# Patient Record
Sex: Male | Born: 1950 | Race: White | Hispanic: No | Marital: Married | Smoking: Never smoker
Health system: Southern US, Community
[De-identification: ages and names within clinical notes are randomized; demographics above are authoritative.]

## PROBLEM LIST (undated history)

## (undated) DIAGNOSIS — E7800 Pure hypercholesterolemia, unspecified: Secondary | ICD-10-CM

## (undated) DIAGNOSIS — I2510 Atherosclerotic heart disease of native coronary artery without angina pectoris: Secondary | ICD-10-CM

## (undated) DIAGNOSIS — I82409 Acute embolism and thrombosis of unspecified deep veins of unspecified lower extremity: Secondary | ICD-10-CM

## (undated) DIAGNOSIS — M1990 Unspecified osteoarthritis, unspecified site: Secondary | ICD-10-CM

## (undated) DIAGNOSIS — M1711 Unilateral primary osteoarthritis, right knee: Secondary | ICD-10-CM

## (undated) DIAGNOSIS — I10 Essential (primary) hypertension: Secondary | ICD-10-CM

## (undated) DIAGNOSIS — I219 Acute myocardial infarction, unspecified: Secondary | ICD-10-CM

## (undated) HISTORY — PX: CARDIAC CATHETERIZATION: SHX172

---

## 2015-05-04 DIAGNOSIS — M170 Bilateral primary osteoarthritis of knee: Secondary | ICD-10-CM | POA: Diagnosis not present

## 2015-05-24 DIAGNOSIS — M170 Bilateral primary osteoarthritis of knee: Secondary | ICD-10-CM | POA: Diagnosis not present

## 2015-05-31 DIAGNOSIS — M170 Bilateral primary osteoarthritis of knee: Secondary | ICD-10-CM | POA: Diagnosis not present

## 2015-06-08 DIAGNOSIS — M170 Bilateral primary osteoarthritis of knee: Secondary | ICD-10-CM | POA: Diagnosis not present

## 2016-05-08 DIAGNOSIS — I10 Essential (primary) hypertension: Secondary | ICD-10-CM | POA: Diagnosis not present

## 2016-05-08 DIAGNOSIS — G8929 Other chronic pain: Secondary | ICD-10-CM | POA: Diagnosis not present

## 2016-05-08 DIAGNOSIS — Z1159 Encounter for screening for other viral diseases: Secondary | ICD-10-CM | POA: Diagnosis not present

## 2016-05-08 DIAGNOSIS — M25562 Pain in left knee: Secondary | ICD-10-CM | POA: Diagnosis not present

## 2016-05-08 DIAGNOSIS — Z125 Encounter for screening for malignant neoplasm of prostate: Secondary | ICD-10-CM | POA: Diagnosis not present

## 2016-05-08 DIAGNOSIS — M25561 Pain in right knee: Secondary | ICD-10-CM | POA: Diagnosis not present

## 2016-05-08 DIAGNOSIS — Z8639 Personal history of other endocrine, nutritional and metabolic disease: Secondary | ICD-10-CM | POA: Diagnosis not present

## 2016-05-08 DIAGNOSIS — Z1211 Encounter for screening for malignant neoplasm of colon: Secondary | ICD-10-CM | POA: Diagnosis not present

## 2016-07-03 ENCOUNTER — Encounter (HOSPITAL_COMMUNITY)
Admission: EM | Disposition: A | Discharge status: Home / Self Care | Payer: Self-pay | Source: Home / Self Care | Attending: Cardiothoracic Surgery

## 2016-07-03 ENCOUNTER — Encounter (HOSPITAL_COMMUNITY): Payer: Self-pay | Admitting: Adult Health

## 2016-07-03 ENCOUNTER — Inpatient Hospital Stay (HOSPITAL_COMMUNITY): Payer: Medicare Other

## 2016-07-03 ENCOUNTER — Inpatient Hospital Stay (HOSPITAL_COMMUNITY)
Admission: EM | Admit: 2016-07-03 | Discharge: 2016-07-10 | DRG: 234 | Disposition: A | Discharge status: Home / Self Care | Payer: Medicare Other | Attending: Cardiothoracic Surgery | Admitting: Cardiothoracic Surgery

## 2016-07-03 DIAGNOSIS — Z01810 Encounter for preprocedural cardiovascular examination: Secondary | ICD-10-CM

## 2016-07-03 DIAGNOSIS — I25110 Atherosclerotic heart disease of native coronary artery with unstable angina pectoris: Secondary | ICD-10-CM | POA: Diagnosis present

## 2016-07-03 DIAGNOSIS — Z09 Encounter for follow-up examination after completed treatment for conditions other than malignant neoplasm: Secondary | ICD-10-CM

## 2016-07-03 DIAGNOSIS — Z8249 Family history of ischemic heart disease and other diseases of the circulatory system: Secondary | ICD-10-CM

## 2016-07-03 DIAGNOSIS — I214 Non-ST elevation (NSTEMI) myocardial infarction: Principal | ICD-10-CM | POA: Diagnosis present

## 2016-07-03 DIAGNOSIS — D6959 Other secondary thrombocytopenia: Secondary | ICD-10-CM | POA: Diagnosis not present

## 2016-07-03 DIAGNOSIS — I2510 Atherosclerotic heart disease of native coronary artery without angina pectoris: Secondary | ICD-10-CM | POA: Diagnosis not present

## 2016-07-03 DIAGNOSIS — I2129 ST elevation (STEMI) myocardial infarction involving other sites: Secondary | ICD-10-CM

## 2016-07-03 DIAGNOSIS — Z8349 Family history of other endocrine, nutritional and metabolic diseases: Secondary | ICD-10-CM

## 2016-07-03 DIAGNOSIS — Z6839 Body mass index (BMI) 39.0-39.9, adult: Secondary | ICD-10-CM

## 2016-07-03 DIAGNOSIS — M1990 Unspecified osteoarthritis, unspecified site: Secondary | ICD-10-CM | POA: Diagnosis present

## 2016-07-03 DIAGNOSIS — E7800 Pure hypercholesterolemia, unspecified: Secondary | ICD-10-CM | POA: Diagnosis not present

## 2016-07-03 DIAGNOSIS — I2584 Coronary atherosclerosis due to calcified coronary lesion: Secondary | ICD-10-CM | POA: Diagnosis present

## 2016-07-03 DIAGNOSIS — E785 Hyperlipidemia, unspecified: Secondary | ICD-10-CM | POA: Diagnosis present

## 2016-07-03 DIAGNOSIS — Z951 Presence of aortocoronary bypass graft: Secondary | ICD-10-CM

## 2016-07-03 DIAGNOSIS — D62 Acute posthemorrhagic anemia: Secondary | ICD-10-CM | POA: Diagnosis not present

## 2016-07-03 DIAGNOSIS — R079 Chest pain, unspecified: Secondary | ICD-10-CM

## 2016-07-03 DIAGNOSIS — J9811 Atelectasis: Secondary | ICD-10-CM

## 2016-07-03 DIAGNOSIS — R072 Precordial pain: Secondary | ICD-10-CM

## 2016-07-03 DIAGNOSIS — E8770 Fluid overload, unspecified: Secondary | ICD-10-CM | POA: Diagnosis not present

## 2016-07-03 DIAGNOSIS — I10 Essential (primary) hypertension: Secondary | ICD-10-CM | POA: Diagnosis not present

## 2016-07-03 DIAGNOSIS — I088 Other rheumatic multiple valve diseases: Secondary | ICD-10-CM | POA: Diagnosis not present

## 2016-07-03 HISTORY — DX: Pure hypercholesterolemia, unspecified: E78.00

## 2016-07-03 HISTORY — DX: Unspecified osteoarthritis, unspecified site: M19.90

## 2016-07-03 HISTORY — DX: Essential (primary) hypertension: I10

## 2016-07-03 HISTORY — PX: LEFT HEART CATH AND CORONARY ANGIOGRAPHY: CATH118249

## 2016-07-03 LAB — BASIC METABOLIC PANEL
Anion gap: 9 (ref 5–15)
BUN: 20 mg/dL (ref 6–20)
CHLORIDE: 103 mmol/L (ref 101–111)
CO2: 25 mmol/L (ref 22–32)
Calcium: 9 mg/dL (ref 8.9–10.3)
Creatinine, Ser: 0.98 mg/dL (ref 0.61–1.24)
GFR calc Af Amer: >60 mL/min (ref >60)
GFR calc non Af Amer: >60 mL/min (ref >60)
Glucose, Bld: 101 mg/dL — ABNORMAL HIGH (ref 65–99)
Potassium: 4.1 mmol/L (ref 3.5–5.1)
SODIUM: 137 mmol/L (ref 135–145)

## 2016-07-03 LAB — BLOOD GAS, ARTERIAL
Acid-Base Excess: 1.7 mmol/L (ref 0.0–2.0)
Bicarbonate: 25.2 mmol/L (ref 20.0–28.0)
Drawn by: 44135
FIO2: 21
O2 Saturation: 95.3 %
Patient temperature: 98.6
pCO2 arterial: 35.7 mmHg (ref 32.0–48.0)
pH, Arterial: 7.462 — ABNORMAL HIGH (ref 7.350–7.450)
pO2, Arterial: 78.8 mmHg — ABNORMAL LOW (ref 83.0–108.0)

## 2016-07-03 LAB — CBC
HEMATOCRIT: 41.2 % (ref 39.0–52.0)
HEMOGLOBIN: 13.6 g/dL (ref 13.0–17.0)
MCH: 28.6 pg (ref 26.0–34.0)
MCHC: 33 g/dL (ref 30.0–36.0)
MCV: 86.7 fL (ref 78.0–100.0)
Platelets: 206 10*3/uL (ref 150–400)
RBC: 4.75 MIL/uL (ref 4.22–5.81)
RDW: 13.9 % (ref 11.5–15.5)
WBC: 6.6 10*3/uL (ref 4.0–10.5)

## 2016-07-03 LAB — POCT I-STAT TROPONIN I: TROPONIN I, POC: 0.01 ng/mL (ref 0.00–0.08)

## 2016-07-03 LAB — ECHOCARDIOGRAM COMPLETE
EERAT: 6.78
FS: 31 % (ref 28–44)
Height: 70 in
IVS/LV PW RATIO, ED: 0.92
LA ID, A-P, ES: 39 mm
LA vol index: 26.3 mL/m2
LADIAMINDEX: 1.6 cm/m2
LAVOL: 64.2 mL
LAVOLA4C: 56.2 mL
LEFT ATRIUM END SYS DIAM: 39 mm
LV E/e' medial: 6.78
LV E/e'average: 6.78
LV PW d: 12 mm — AB (ref 0.6–1.1)
LVELAT: 13.3 cm/s
LVOT SV: 71 mL
LVOT VTI: 22.7 cm
LVOT area: 3.14 cm2
LVOT diameter: 20 mm
LVOT peak grad rest: 3 mmHg
LVOT peak vel: 93.1 cm/s
Lateral S' vel: 14 cm/s
MV Peak grad: 3 mmHg
MV pk A vel: 70.3 m/s
MVPKEVEL: 90.2 m/s
RV TAPSE: 27 mm
TDI e' lateral: 13.3
TDI e' medial: 9.14
WEIGHTICAEL: 4091.74 [oz_av]

## 2016-07-03 LAB — POCT I-STAT, CHEM 8
BUN: 22 mg/dL — AB (ref 6–20)
CALCIUM ION: 1.14 mmol/L — AB (ref 1.15–1.40)
CREATININE: 0.9 mg/dL (ref 0.61–1.24)
Chloride: 100 mmol/L — ABNORMAL LOW (ref 101–111)
GLUCOSE: 110 mg/dL — AB (ref 65–99)
HCT: 39 % (ref 39.0–52.0)
Hemoglobin: 13.3 g/dL (ref 13.0–17.0)
Potassium: 3.1 mmol/L — ABNORMAL LOW (ref 3.5–5.1)
Sodium: 138 mmol/L (ref 135–145)
TCO2: 26 mmol/L (ref 0–100)

## 2016-07-03 LAB — ABO/RH: ABO/RH(D): O POS

## 2016-07-03 LAB — TROPONIN I: Troponin I: 0.97 ng/mL (ref <0.03)

## 2016-07-03 LAB — SURGICAL PCR SCREEN
MRSA, PCR: NEGATIVE
Staphylococcus aureus: NEGATIVE

## 2016-07-03 LAB — PROTIME-INR
INR: 1.06
Prothrombin Time: 13.9 seconds (ref 11.4–15.2)

## 2016-07-03 SURGERY — LEFT HEART CATH AND CORONARY ANGIOGRAPHY

## 2016-07-03 MED ORDER — TRANEXAMIC ACID (OHS) BOLUS VIA INFUSION
15.0000 mg/kg | INTRAVENOUS | Status: DC
  Filled 2016-07-03: qty 1740

## 2016-07-03 MED ORDER — SODIUM CHLORIDE 0.9% FLUSH
3.0000 mL | Freq: Two times a day (BID) | INTRAVENOUS | Status: DC
  Administered 2016-07-04: 3 mL via INTRAVENOUS

## 2016-07-03 MED ORDER — SODIUM CHLORIDE 0.9 % IV SOLN: 250.0000 mL | INTRAVENOUS | Status: DC | PRN

## 2016-07-03 MED ORDER — SODIUM CHLORIDE 0.9 % IV SOLN
INTRAVENOUS | Status: DC
  Filled 2016-07-03: qty 30

## 2016-07-03 MED ORDER — SODIUM CHLORIDE 0.9 % IV SOLN
30.0000 ug/min | INTRAVENOUS | Status: DC
  Filled 2016-07-03: qty 2

## 2016-07-03 MED ORDER — SODIUM CHLORIDE 0.9 % IV SOLN
INTRAVENOUS | Status: DC | PRN
  Administered 2016-07-03: 100 mL/h via INTRAVENOUS

## 2016-07-03 MED ORDER — METOPROLOL SUCCINATE ER 25 MG PO TB24
12.5000 mg | ORAL_TABLET | Freq: Every day | ORAL | Status: DC
  Administered 2016-07-03: 12.5 mg via ORAL
  Filled 2016-07-03 (×2): qty 1

## 2016-07-03 MED ORDER — ACETAMINOPHEN 325 MG PO TABS: 650.0000 mg | ORAL_TABLET | ORAL | Status: DC | PRN

## 2016-07-03 MED ORDER — NITROGLYCERIN IN D5W 200-5 MCG/ML-% IV SOLN
INTRAVENOUS | Status: AC
  Filled 2016-07-03: qty 250

## 2016-07-03 MED ORDER — ONDANSETRON HCL 4 MG/2ML IJ SOLN: 4.0000 mg | Freq: Four times a day (QID) | INTRAMUSCULAR | Status: DC | PRN

## 2016-07-03 MED ORDER — CHLORHEXIDINE GLUCONATE 0.12 % MT SOLN
15.0000 mL | Freq: Once | OROMUCOSAL | Status: AC
  Administered 2016-07-04: 15 mL via OROMUCOSAL

## 2016-07-03 MED ORDER — POTASSIUM CHLORIDE 2 MEQ/ML IV SOLN
80.0000 meq | INTRAVENOUS | Status: DC
  Filled 2016-07-03: qty 40

## 2016-07-03 MED ORDER — NITROGLYCERIN 0.4 MG SL SUBL
0.4000 mg | SUBLINGUAL_TABLET | SUBLINGUAL | Status: DC | PRN
  Administered 2016-07-03: 0.4 mg via SUBLINGUAL
  Filled 2016-07-03: qty 1

## 2016-07-03 MED ORDER — TRANEXAMIC ACID (OHS) PUMP PRIME SOLUTION
2.0000 mg/kg | INTRAVENOUS | Status: DC
  Filled 2016-07-03: qty 2.32

## 2016-07-03 MED ORDER — CHLORHEXIDINE GLUCONATE CLOTH 2 % EX PADS
6.0000 | MEDICATED_PAD | Freq: Once | CUTANEOUS | Status: AC
  Administered 2016-07-03: 6 via TOPICAL

## 2016-07-03 MED ORDER — TRANEXAMIC ACID 1000 MG/10ML IV SOLN
1.5000 mg/kg/h | INTRAVENOUS | Status: DC
  Filled 2016-07-03: qty 25

## 2016-07-03 MED ORDER — LIDOCAINE HCL (PF) 1 % IJ SOLN
INTRAMUSCULAR | Status: AC
  Filled 2016-07-03: qty 30

## 2016-07-03 MED ORDER — BISACODYL 5 MG PO TBEC
5.0000 mg | DELAYED_RELEASE_TABLET | Freq: Once | ORAL | Status: AC
  Administered 2016-07-03: 5 mg via ORAL
  Filled 2016-07-03: qty 1

## 2016-07-03 MED ORDER — NITROGLYCERIN IN D5W 200-5 MCG/ML-% IV SOLN
0.0000 ug/min | INTRAVENOUS | Status: DC
Start: 2016-07-03 — End: 2016-07-04
  Administered 2016-07-03: 5 ug/min via INTRAVENOUS

## 2016-07-03 MED ORDER — ASPIRIN 81 MG PO CHEW
324.0000 mg | CHEWABLE_TABLET | Freq: Once | ORAL | Status: AC
  Administered 2016-07-03: 324 mg via ORAL
  Filled 2016-07-03: qty 4

## 2016-07-03 MED ORDER — MAGNESIUM SULFATE 50 % IJ SOLN
40.0000 meq | INTRAMUSCULAR | Status: DC
  Filled 2016-07-03: qty 10

## 2016-07-03 MED ORDER — SODIUM CHLORIDE 0.9% FLUSH: 3.0000 mL | INTRAVENOUS | Status: DC | PRN

## 2016-07-03 MED ORDER — HEPARIN (PORCINE) IN NACL 100-0.45 UNIT/ML-% IJ SOLN
1300.0000 [IU]/h | INTRAMUSCULAR | Status: DC
  Administered 2016-07-04: 1300 [IU]/h via INTRAVENOUS
  Filled 2016-07-03: qty 250

## 2016-07-03 MED ORDER — IOPAMIDOL (ISOVUE-370) INJECTION 76%
INTRAVENOUS | Status: AC
  Filled 2016-07-03: qty 125

## 2016-07-03 MED ORDER — NITROGLYCERIN IN D5W 200-5 MCG/ML-% IV SOLN
INTRAVENOUS | Status: DC | PRN
  Administered 2016-07-03: 10 ug/min via INTRAVENOUS

## 2016-07-03 MED ORDER — CHLORHEXIDINE GLUCONATE CLOTH 2 % EX PADS: 6.0000 | MEDICATED_PAD | Freq: Once | CUTANEOUS | Status: AC

## 2016-07-03 MED ORDER — TEMAZEPAM 7.5 MG PO CAPS
15.0000 mg | ORAL_CAPSULE | Freq: Once | ORAL | Status: DC | PRN
  Filled 2016-07-03: qty 2

## 2016-07-03 MED ORDER — PLASMA-LYTE 148 IV SOLN
INTRAVENOUS | Status: DC
  Filled 2016-07-03: qty 2.5

## 2016-07-03 MED ORDER — IOPAMIDOL (ISOVUE-370) INJECTION 76%
INTRAVENOUS | Status: DC | PRN
  Administered 2016-07-03: 110 mL via INTRA_ARTERIAL

## 2016-07-03 MED ORDER — SODIUM CHLORIDE 0.9 % IV SOLN
INTRAVENOUS | Status: DC
  Filled 2016-07-03: qty 2.5

## 2016-07-03 MED ORDER — NITROGLYCERIN IN D5W 200-5 MCG/ML-% IV SOLN
2.0000 ug/min | INTRAVENOUS | Status: DC
  Filled 2016-07-03: qty 250

## 2016-07-03 MED ORDER — MIDAZOLAM HCL 2 MG/2ML IJ SOLN
INTRAMUSCULAR | Status: DC | PRN
Start: 2016-07-03 — End: 2016-07-03
  Administered 2016-07-03: 1 mg via INTRAVENOUS

## 2016-07-03 MED ORDER — DOPAMINE-DEXTROSE 3.2-5 MG/ML-% IV SOLN
0.0000 ug/kg/min | INTRAVENOUS | Status: DC
  Filled 2016-07-03: qty 250

## 2016-07-03 MED ORDER — DEXTROSE 5 % IV SOLN
750.0000 mg | INTRAVENOUS | Status: DC
  Filled 2016-07-03: qty 750

## 2016-07-03 MED ORDER — VERAPAMIL HCL 2.5 MG/ML IV SOLN
INTRAVENOUS | Status: DC | PRN
  Administered 2016-07-03: 10 mL via INTRA_ARTERIAL

## 2016-07-03 MED ORDER — MIDAZOLAM HCL 2 MG/2ML IJ SOLN
INTRAMUSCULAR | Status: AC
Start: 2016-07-03 — End: 2016-07-03
  Filled 2016-07-03: qty 2

## 2016-07-03 MED ORDER — VERAPAMIL HCL 2.5 MG/ML IV SOLN
INTRAVENOUS | Status: AC
  Filled 2016-07-03: qty 2

## 2016-07-03 MED ORDER — DEXMEDETOMIDINE HCL IN NACL 400 MCG/100ML IV SOLN
0.1000 ug/kg/h | INTRAVENOUS | Status: DC
  Filled 2016-07-03: qty 100

## 2016-07-03 MED ORDER — VANCOMYCIN HCL 10 G IV SOLR
1500.0000 mg | INTRAVENOUS | Status: DC
  Filled 2016-07-03: qty 1500

## 2016-07-03 MED ORDER — LIDOCAINE HCL (PF) 1 % IJ SOLN
INTRAMUSCULAR | Status: DC | PRN
  Administered 2016-07-03: 20 mL
  Administered 2016-07-03: 2 mL

## 2016-07-03 MED ORDER — SODIUM CHLORIDE 0.9 % WEIGHT BASED INFUSION
1.0000 mL/kg/h | INTRAVENOUS | Status: DC
  Administered 2016-07-03: 250 mL via INTRAVENOUS

## 2016-07-03 MED ORDER — ASPIRIN 81 MG PO CHEW: 81.0000 mg | CHEWABLE_TABLET | Freq: Every day | ORAL | Status: DC

## 2016-07-03 MED ORDER — CEFUROXIME SODIUM 1.5 G IJ SOLR
1.5000 g | INTRAMUSCULAR | Status: DC
  Filled 2016-07-03: qty 1.5

## 2016-07-03 MED ORDER — EPINEPHRINE PF 1 MG/ML IJ SOLN
0.0000 ug/min | INTRAVENOUS | Status: DC
  Filled 2016-07-03: qty 4

## 2016-07-03 MED ORDER — HEPARIN (PORCINE) IN NACL 2-0.9 UNIT/ML-% IJ SOLN
INTRAMUSCULAR | Status: DC | PRN
  Administered 2016-07-03: 1000 mL

## 2016-07-03 MED ORDER — NITROGLYCERIN 0.4 MG SL SUBL: 0.4000 mg | SUBLINGUAL_TABLET | SUBLINGUAL | Status: DC | PRN

## 2016-07-03 MED ORDER — FENTANYL CITRATE (PF) 100 MCG/2ML IJ SOLN
INTRAMUSCULAR | Status: AC
  Filled 2016-07-03: qty 2

## 2016-07-03 MED ORDER — HEPARIN (PORCINE) IN NACL 2-0.9 UNIT/ML-% IJ SOLN
INTRAMUSCULAR | Status: AC
  Filled 2016-07-03: qty 1000

## 2016-07-03 MED ORDER — FENTANYL CITRATE (PF) 100 MCG/2ML IJ SOLN
INTRAMUSCULAR | Status: DC | PRN
  Administered 2016-07-03: 25 ug via INTRAVENOUS

## 2016-07-03 MED ORDER — METOPROLOL TARTRATE 12.5 MG HALF TABLET
12.5000 mg | ORAL_TABLET | Freq: Once | ORAL | Status: AC
  Administered 2016-07-04: 12.5 mg via ORAL
  Filled 2016-07-03: qty 1

## 2016-07-03 SURGICAL SUPPLY — 16 items
CATH EXPO 5FR ANG PIGTAIL 145 (CATHETERS) ×2 IMPLANT
CATH EXPO 5FR FR4 (CATHETERS) ×2 IMPLANT
DEVICE RAD COMP TR BAND LRG (VASCULAR PRODUCTS) ×2 IMPLANT
GLIDESHEATH SLEND SS 6F .021 (SHEATH) ×2 IMPLANT
GUIDE CATH RUNWAY 6FR CLS3.5 (CATHETERS) ×2 IMPLANT
GUIDEWIRE INQWIRE 1.5J.035X260 (WIRE) ×1 IMPLANT
INQWIRE 1.5J .035X260CM (WIRE) ×2
KIT ENCORE 26 ADVANTAGE (KITS) ×2 IMPLANT
KIT HEART LEFT (KITS) ×2 IMPLANT
PACK CARDIAC CATHETERIZATION (CUSTOM PROCEDURE TRAY) ×2 IMPLANT
SHEATH PINNACLE 6F 10CM (SHEATH) ×2 IMPLANT
SYR MEDRAD MARK V 150ML (SYRINGE) ×2 IMPLANT
TRANSDUCER W/STOPCOCK (MISCELLANEOUS) ×2 IMPLANT
TUBING CIL FLEX 10 FLL-RA (TUBING) ×2 IMPLANT
WIRE EMERALD 3MM-J .035X150CM (WIRE) ×2 IMPLANT
WIRE HI TORQ VERSACORE-J 145CM (WIRE) ×2 IMPLANT

## 2016-07-03 NOTE — ED Notes (Signed)
Cardiology at bedside.

## 2016-07-03 NOTE — ED Notes (Signed)
MD at bedside. 

## 2016-07-03 NOTE — ED Provider Notes (Signed)
[facility]-EMERGENCY DEPT Provider Note   CSN: [id number] Arrival date & time: 07/03/16  1536     History   Chief Complaint Chief Complaint  Patient presents with  . Chest Pain    HPI Joseph COZZOLINO is a 66 y.o. male.  66 yo M with a chief complaint of chest pain. Patient states this started about an hour or so ago. Was sitting on a stool when it occurred. Nothing seems to make it worse. Patient said it improved when he took some aspirin. Mild shortness of breath with it. Denies radiation denies diaphoresis or vomiting. Denies lower extremity edema denies history of PE or DVT. As his travel or recent hospitalization. Has a history of hypertension denies hyperlipidemia diabetes family history or smoking.   The history is provided by the patient.  Chest Pain   This is a new problem. The current episode started 1 to 2 hours ago. The problem occurs constantly. The problem has been rapidly improving. The pain is present in the substernal region. The pain is at a severity of 6/10. The pain is moderate. The quality of the pain is described as brief. The pain does not radiate. Duration of episode(s) is 1 hour. Associated symptoms include shortness of breath. Pertinent negatives include no abdominal pain, no fever, no headaches, no palpitations and no vomiting. Treatments tried: aspirin. The treatment provided significant relief.  His past medical history is significant for hypertension.  Pertinent negatives for past medical history include no CAD, no diabetes, no hyperlipidemia, no MI, no PE and no PVD.  His family medical history is significant for heart disease.  Pertinent negatives for family medical history include: no early MI and no PE.    Past Medical History:  Diagnosis Date  . Hypertension     There are no active problems to display for this patient.   History reviewed. No pertinent surgical history.     Home Medications    Prior to Admission medications   Not on File     Family History History reviewed. No pertinent family history.  Social History Social History  Substance Use Topics  . Smoking status: Never Smoker  . Smokeless tobacco: Never Used  . Alcohol use No     Allergies   Patient has no known allergies.   Review of Systems Review of Systems  Constitutional: Negative for chills and fever.  HENT: Negative for congestion and facial swelling.   Eyes: Negative for discharge and visual disturbance.  Respiratory: Positive for shortness of breath.   Cardiovascular: Positive for chest pain. Negative for palpitations.  Gastrointestinal: Negative for abdominal pain, diarrhea and vomiting.  Musculoskeletal: Negative for arthralgias and myalgias.  Skin: Negative for color change and rash.  Neurological: Negative for tremors, syncope and headaches.  Psychiatric/Behavioral: Negative for confusion and dysphoric mood.     Physical Exam Updated Vital Signs BP 146/94 (BP Location: Right Arm)   Pulse 76   Temp 98.1 F (36.7 C) (Oral)   Resp 18   SpO2 95%   Physical Exam  Constitutional: He is oriented to person, place, and time. He appears well-developed and well-nourished.  HENT:  Head: Normocephalic and atraumatic.  Eyes: EOM are normal. Pupils are equal, round, and reactive to light.  Neck: Normal range of motion. Neck supple. No JVD present.  Cardiovascular: Normal rate and regular rhythm.  Exam reveals no gallop and no friction rub.   No murmur heard. Pulmonary/Chest: No respiratory distress. He has no wheezes.  Abdominal: He exhibits  no distension and no mass. There is no tenderness. There is no rebound and no guarding.  Musculoskeletal: Normal range of motion.  Neurological: He is alert and oriented to person, place, and time.  Skin: No rash noted. No pallor.  Psychiatric: He has a normal mood and affect. His behavior is normal.  Nursing note and vitals reviewed.    ED Treatments / Results  Labs (all labs ordered are  listed, but only abnormal results are displayed) Labs Reviewed  CBC  BASIC METABOLIC PANEL  I-STAT TROPOININ, ED    EKG  EKG Interpretation  Date/Time:  Thursday July 03 2016 15:42:33 EST Ventricular Rate:  72 PR Interval:    QRS Duration: 117 QT Interval:  397 QTC Calculation: 435 R Axis:   29 Text Interpretation:  Sinus rhythm Nonspecific intraventricular conduction delay concern for posterior ischemia with depression in v2-3.  ? reciprical changes in II, III, avF No old tracing to compare Confirmed by Tayvian Holycross MD, DANIEL [phone]) on 07/03/2016 3:50:52 PM       Radiology No results found.  Procedures Procedures (including critical care time)  Medications Ordered in ED Medications  nitroGLYCERIN (NITROSTAT) SL tablet 0.4 mg (0.4 mg Sublingual Given 07/03/16 1559)  aspirin chewable tablet 324 mg (324 mg Oral Given 07/03/16 1557)     Initial Impression / Assessment and Plan / ED Course  I have reviewed the triage vital signs and the nursing notes.  Pertinent labs & imaging results that were available during my care of the patient were reviewed by me and considered in my medical decision making (see chart for details).     66 yo M With a chief complaint of chest pain. Patient's initial EKG concerning for a posterior ST elevation MI. I discussed this with Dr. [place], cardiology. Came to evaluate the patient at bedside.  Cath lab activated.   CRITICAL CARE Performed by: Rae Roam   Total critical care time: 30 minutes  Critical care time was exclusive of separately billable procedures and treating other patients.  Critical care was necessary to treat or prevent imminent or life-threatening deterioration.  Critical care was time spent personally by me on the following activities: development of treatment plan with patient and/or surrogate as well as nursing, discussions with consultants, evaluation of patient's response to treatment, examination of patient,  obtaining history from patient or surrogate, ordering and performing treatments and interventions, ordering and review of laboratory studies, ordering and review of radiographic studies, pulse oximetry and re-evaluation of patient's condition.   The patients results and plan were reviewed and discussed.   Any x-rays performed were independently reviewed by myself.   Differential diagnosis were considered with the presenting HPI.  Medications  nitroGLYCERIN (NITROSTAT) SL tablet 0.4 mg (0.4 mg Sublingual Given 07/03/16 1559)  aspirin chewable tablet 324 mg (324 mg Oral Given 07/03/16 1557)    Vitals:   07/03/16 1545 07/03/16 1545 07/03/16 1558  BP: 142/90  146/94  Pulse: 74  76  Resp: 18  18  Temp:  98.1 F (36.7 C)   TempSrc:  Oral   SpO2: 95%  95%    Final diagnoses:  Acute ST elevation myocardial infarction (STEMI) of posterior wall [facility])    Admission/ observation were discussed with the admitting physician, patient and/or family and they are comfortable with the plan.    Final Clinical Impressions(s) / ED Diagnoses   Final diagnoses:  Acute ST elevation myocardial infarction (STEMI) of posterior wall [facility])    New Prescriptions  New Prescriptions   No medications on file     Melene PlanDan Deosha Werden, DO 07/03/16 1607

## 2016-07-03 NOTE — H&P (Signed)
History & Physical    Patient ID: TIGE MEAS MRN: [id number], DOB/AGE: [birth year]   Admit date: 07/03/2016   Primary Physician: No primary care provider on file. Primary Cardiologist: New ([place])  Patient Profile    66 yo male with PMH of HL, HTN, and osteoarthritis who presented with chest pain and called a NSTEMI in the ED and taken to the cath lab.   Past Medical History    Past Medical History:  Diagnosis Date  . Hypercholesterolemia   . Hypertension   . Osteoarthritis     History reviewed. No pertinent surgical history.   Allergies  No Known Allergies  History of Present Illness    Mr. Peace is a 66 yo male with PMH of HL, HTN, and osteoarthritis. Reports he was in his usual state of health until this afternoon around 2pm when he developed centralized chest pain, after eating peanuts felt like indigestion. Took alkaseltzer that did not relieve the pain. No reported dysnea, dizziness, n/v or diaphoresis. No radiation of pain. Patient states he has severe arthritis in his knees that limits his activity.   He presented to the ED where EKG showed SR with ST depression in II, III aVF, v3 and v4. He was given 324 ASA PTA. Reported his pain was a 1/10 while in the ED. STEMI was activated in the ED by Dr. [place] and he was brought directed to the cath lab for cardiac catheterization.   Home Medications    Prior to Admission medications   Medication Sig Start Date End Date Taking? Authorizing Provider  amLODipine (NORVASC) 10 MG tablet Take 10 mg by mouth daily.   Yes Historical Provider, MD  meloxicam (MOBIC) 15 MG tablet Take 15 mg by mouth daily.   Yes Historical Provider, MD    Family History    Family History  Problem Relation Age of Onset  . Heart disease Mother   . Hypercholesterolemia Mother     Social History    Social History   Social History  . Marital status: Married    Spouse name: N/A  . Number of children: 1  . Years of education: N/A    Occupational History  . retired Copywriter, advertising    Social History Main Topics  . Smoking status: Never Smoker  . Smokeless tobacco: Never Used  . Alcohol use No  . Drug use: No  . Sexual activity: Not on file   Other Topics Concern  . Not on file   Social History Narrative  . No narrative on file     Review of Systems    General:  No chills, fever, night sweats or weight changes.  Cardiovascular:  See HPI Dermatological: No rash, lesions/masses Respiratory: No cough, dyspnea Urologic: No hematuria, dysuria Abdominal:   No nausea, vomiting, diarrhea, bright red blood per rectum, melena, or hematemesis Neurologic:  No visual changes, wkns, changes in mental status. All other systems reviewed and are otherwise negative except as noted above.  Physical Exam    Blood pressure 146/94, pulse 76, temperature 98.1 F (36.7 C), temperature source Oral, resp. rate 18, height 5\' 10"  (1.778 m), weight 260 lb (117.9 kg), SpO2 98 %.  General: Pleasant, NAD Psych: Normal affect. Neuro: Alert and oriented X 3. Moves all extremities spontaneously. HEENT: Normal  Neck: Supple without bruits or JVD. Lungs:  Resp regular and unlabored, CTA. Heart: RRR no s3, s4, or murmurs. Abdomen: Soft, non-tender, non-distended, BS + x 4.  Extremities: No clubbing, cyanosis or  edema. DP/PT/Radials 2+ and equal bilaterally.  Labs    Troponin (Point of Care Test)  Recent Labs  07/03/16 1602  TROPIPOC 0.01   No results fTotal [facility] input(s): CKTOTAL, CKMB, TROPONINI in the last 72 hours. Lab Results  Component Value Date   WBC 6.6 07/03/2016   HGB 13.6 07/03/2016   HCT 41.2 07/03/2016   MCV 86.7 07/03/2016   PLT 206 07/03/2016     Recent Labs Lab 07/03/16 1552  NA 137  K 4.1  CL 103  CO2 25  BUN 20  CREATININE 0.98  CALCIUM 9.0  GLUCOSE 101*   No results found for: CHOL, HDL, LDLCALC, TRIG No results found for: [facility]   Radiology Studies    No results found.  ECG & Cardiac Imaging     EKG: SR with ST depression in II, III aVF, v3 and v4  Assessment & Plan    66 yo male with PMH of HL, HTN, and osteoarthritis who presented with chest pain and called NSTEMI in the ED.  1. NSTEMI: Reported indigestion like pain that started around 2pm this afternoon. Code STEMI was called and he was taken directly to the cath lab for cardiac catheterization with Dr. [place]. Further recommendations post procedure.   2. HTN: Reports he is followed by PCP and takes lisinopril and amlodipine at home.   3. HL: Reports uncontrolled hyperlipidemia but has been intolerant to statins in the past.   Signed, Laverda PageLindsay Roberts, NP-C Pager [phone] 07/03/2016, 4:54 PM Patient seen and examined and history reviewed. Agree with above findings and plan. 66 yo WM with history of HTN on amlodipine and lisinopril. Also history of hypercholesterolemia with cholesterol over 300. History of intolerance to statins. No prior cardiac history. Came to ED today with complaints of mid sternal chest pain. Unrelieved with antacids. Ecg on arrival showed ST depression in the inferior leads and V3-4. He had ongoing chest pain so emergent cardiac cath recommended. Given ASA in the ED. Further management depending on results of cardiac cath. Given HLD may want to consider Zetia and very low dose of Crestor.   Miquela Costabile [place], MDFACC 07/03/2016 5:33 PM

## 2016-07-03 NOTE — Progress Notes (Signed)
Pre-op Cardiac Surgery  Carotid Findings:  Bilateral 1-39% ICA stenosis, antegrade vertebral flow.   Upper Extremity Right Left  Brachial Pressures 145, Tri 137, Tri  Radial Waveforms TR BAND- difficult to eval Tri  Ulnar Waveforms Tri Tri  Palmar Arch (Allen's Test) inconclusive waveform increases with radial compression and diminished >50% with ulnar compression.    Lower  Extremity Right Left  Dorsalis Pedis 170, Tri 177, Tri  Posterior Tibial 181, Tri 178, Tri  Ankle/Brachial Indices 1.2 1.2    Levin Baconlaire Rian Koon- RDMS, RVT 8:14 PM  07/03/2016

## 2016-07-03 NOTE — Progress Notes (Signed)
  Echocardiogram 2D Echocardiogram has been performed.  Joseph Kidd, Joseph Kidd 07/03/2016, 6:48 PM

## 2016-07-03 NOTE — ED Notes (Signed)
Patient transported to cath lab

## 2016-07-03 NOTE — ED Triage Notes (Signed)
Presents with sternal chest pain described as "I felt like it was indigestion, It started and stayed, I took alkaseltzer with out relief" pain rated at this time 1/10, denies SOB, dizziness, diaphoresis. Pain began at 2 pm.324 ASA given PTA.

## 2016-07-03 NOTE — Progress Notes (Signed)
Discussed plan of care with patient including labwork overnight, pre-op bath/shaving, IS, plan/times in the morning, consent forms. Encouraged patient to watch CABG videos but patient politely declined. Asked him to call me if his wife wanted to watch them when she returned..  Patient shown IS with teach back- he easily did 2250 ml multiple times. Gave him [facility] general booklet and cardiac surgery booklet and encouraged him to read it. Patient has no questions and comfortable with his knowledge. Patient is calm and very pleasant.

## 2016-07-03 NOTE — Progress Notes (Signed)
ANTICOAGULATION CONSULT NOTE - Initial Consult  Pharmacy Consult for heparin Indication: chest pain/ACS  No Known Allergies  Patient Measurements: Height: 5\' 10"  (177.8 cm) Weight: 260 lb (117.9 kg) IBW/kg (Calculated) : 73 Heparin Dosing Weight: 99.3 kg  Vital Signs: Temp: 98.1 F (36.7 C) (03/08 1545) Temp Source: Oral (03/08 1545) BP: 146/94 (03/08 1558) Pulse Rate: 76 (03/08 1558)  Labs:  Recent Labs  07/03/16 1552  HGB 13.6  HCT 41.2  PLT 206  CREATININE 0.98    Estimated Creatinine Clearance: 95.4 mL/min (by C-G formula based on SCr of 0.98 mg/dL).   Medical History: Past Medical History:  Diagnosis Date  . Hypercholesterolemia   . Hypertension   . Osteoarthritis     Medications:  Prescriptions Prior to Admission  Medication Sig Dispense Refill Last Dose  . amLODipine (NORVASC) 10 MG tablet Take 10 mg by mouth daily.   07/03/2016 at Unknown time  . meloxicam (MOBIC) 15 MG tablet Take 15 mg by mouth daily.   07/03/2016 at Unknown time   Scheduled:  . aspirin  81 mg Oral Daily  . metoprolol succinate  12.5 mg Oral Daily  . [START ON 07/04/2016] sodium chloride flush  3 mL Intravenous Q12H   Infusions:  . sodium chloride    . 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 Patient does not report any medications PTA. Heparin to start 8 hours post-sheath removal to bridge to likely CABG.   Baseline CBC is within normal limits with no notes of bleeding.   Goal of Therapy:  Heparin level 0.3-0.7 units/ml Monitor platelets by anticoagulation protocol: Yes   Plan:  Start heparin 8 hours post-sheath removal at 0100 on 3/9 Start heparin at 1300 units/hr IV, no bolus 6-hr heparin level Daily CBC and  heparin level Monitor s/sx of bleeding F/U CABG plans  Mahala MenghiniMargaret E Shuda 07/03/2016,5:02 PM

## 2016-07-03 NOTE — Progress Notes (Signed)
Patient ID: Joseph Kidd, male   DOB: 22-Jun-1950, 66 y.o.   MRN: [id number]       [address]       [place] [address]             [phone]        MISSAEL FERRARI [facility] Medical Record #[id number] Date of Birth: Aug 06, 1950  Referring: No ref. provider found Primary Care: No primary care provider on file.  Chief Complaint:    Chief Complaint  Patient presents with  . Chest Pain  urgent consult to cath lab 5:15 pm  History of Present Illness:     Patient with no previous history of cardiac disease other then intolerance of statins .  Noted chest pain this afternoon while at work doing paper work. He came to the er and was seen by cardiology and underwent cardiac cath. When seen in cath lab his chest pain had resolved . Pertinent negatives for past medical history include no CAD, no diabetes, no hyperlipidemia, no MI, no PE and no PVD.  His family medical history is significant for heart disease.  Current Activity/ Functional Status: Patient is independent with mobility/ambulation, transfers, ADL's, IADL's.   Zubrod Score: At the time of surgery this patient's most appropriate activity status/level should be described as: []     0    Normal activity, no symptoms [x]     1    Restricted in physical strenuous activity but ambulatory, able to do out light work []     2    Ambulatory and capable of self care, unable to do work activities, up and about                 more than 50%  Of the time                            []     3    Only limited self care, in bed greater than 50% of waking hours []     4    Completely disabled, no self care, confined to bed or chair []     5    Moribund  Past Medical History:  Diagnosis Date  . Hypercholesterolemia   . Hypertension   . Osteoarthritis     History reviewed. No pertinent surgical history.  History  Smoking Status  . Never Smoker  Smokeless Tobacco  . Never Used    History  Alcohol Use No    Social  History   Social History  . Marital status: Married    Spouse name: N/A  . Number of children: 1  . Years of education: N/A   Occupational History  . retired Copywriter, advertising    Social History Main Topics  . Smoking status: Never Smoker  . Smokeless tobacco: Never Used  . Alcohol use No  . Drug use: No  . Sexual activity: Not on file   Other Topics Concern  . Not on file   Social History Narrative  . No narrative on file    No Known Allergies  Current Facility-Administered Medications  Medication Dose Route Frequency Provider Last Rate Last Dose  . [START ON 07/04/2016] 0.9 %  sodium chloride infusion  250 mL Intravenous PRN Peter M Swazil[place], MD      . 0.9% sodium chloride infusion  1 mL/kg/hr Intravenous Continuous Peter M [place], MD 999 mL/hr at 07/03/16 1722 250 mL at 07/03/16 1722  .  acetaminophen (TYLENOL) tablet 650 mg  650 mg Oral Q4H PRN Peter M SwazilandJordan, MD      . aspirin chewable tablet 81 mg  81 mg Oral Daily Peter M SwazilandJordan, MD      . Melene Muller[START ON 07/04/2016] heparin ADULT infusion 100 units/mL (25000 units/26350mL sodium chloride 0.45%)  1,300 Units/hr Intravenous Continuous Mahala MenghiniMargaret E Shuda, RPH      . metoprolol succinate (TOPROL-XL) 24 hr tablet 12.5 mg  12.5 mg Oral Daily Arty BaumgartnerLindsay B Roberts, NP      . nitroGLYCERIN (NITROSTAT) SL tablet 0.4 mg  0.4 mg Sublingual Q5 min PRN Melene Planan Floyd, DO   0.4 mg at 07/03/16 1559  . nitroGLYCERIN 50 mg in dextrose 5 % 250 mL (0.2 mg/mL) infusion  0-200 mcg/min Intravenous Titrated Peter M SwazilandJordan, MD 1.5 mL/hr at 07/03/16 1800 5 mcg/min at 07/03/16 1800  . ondansetron (ZOFRAN) injection 4 mg  4 mg Intravenous Q6H PRN Peter M SwazilandJordan, MD      . Melene Muller[START ON 07/04/2016] sodium chloride flush (NS) 0.9 % injection 3 mL  3 mL Intravenous Q12H Peter M SwazilandJordan, MD      . Melene Muller[START ON 07/04/2016] sodium chloride flush (NS) 0.9 % injection 3 mL  3 mL Intravenous PRN Peter M SwazilandJordan, MD        Prescriptions Prior to Admission  Medication Sig Dispense Refill Last Dose  .  amLODipine (NORVASC) 10 MG tablet Take 10 mg by mouth daily.   07/03/2016 at Unknown time  . meloxicam (MOBIC) 15 MG tablet Take 15 mg by mouth daily.   07/03/2016 at Unknown time    Family History  Problem Relation Age of Onset  . Heart disease Mother   . Hypercholesterolemia Mother      Review of Systems:      Cardiac Review of Systems: Y or N  Chest Pain [ y   ]  Resting SOB [n   ] Exertional SOB  [ y ]  Orthopnea [  ]   Pedal Edema [   ]    Palpitations [  ] Syncope  [  ]   Presyncope [   ]  General Review of Systems: [Y] = yes [  ]=no Constitional: recent weight change [  ]; anorexia [  ]; fatigue [  ]; nausea [  ]; night sweats [  ]; fever [  ]; or chills [  ]                                                               Dental: poor dentition[  ]; Last Dentist visit:   Eye : blurred vision [  ]; diplopia [   ]; vision changes [  ];  Amaurosis fugax[  ]; Resp: cough [  ];  wheezing[  ];  hemoptysis[  ]; shortness of breath[  ]; paroxysmal nocturnal dyspnea[  ]; dyspnea on exertion[  ]; or orthopnea[  ];  GI:  gallstones[  ], vomiting[  ];  dysphagia[  ]; melena[  ];  hematochezia [  ]; heartburn[  ];   Hx of  Colonoscopy[  ]; GU: kidney stones [  ]; hematuria[  ];   dysuria [  ];  nocturia[  ];  history of     obstruction [  ];  urinary frequency [  ]             Skin: rash, swelling[  ];, hair loss[  ];  peripheral edema[  ];  or itching[  ]; Musculosketetal: myalgias[  ];  joint swelling[  ];  joint erythema[  ];  joint pain[  ];  back pain[  ];  Heme/Lymph: bruising[  ];  bleeding[  ];  anemia[  ];  Neuro: TIA[  ];  headaches[  ];  stroke[  ];  vertigo[  ];  seizures[  ];   paresthesias[  ];  difficulty walking[ y ];  Psych:depression[  ]; anxiety[  ];  Endocrine: diabetes[  ];  thyroid dysfunction[  ];  Immunizations: Flu [  ]; Pneumococcal[  ];  Other:  Physical Exam: BP 128/86 (BP Location: Left Arm)   Pulse 69   Temp 97.9 F (36.6 C) (Oral)   Resp 14   Ht 5\' 10"  (1.778  m)   Wt 255 lb 11.7 oz (116 kg)   SpO2 96%   BMI 36.69 kg/m    General appearance: alert and cooperative Head: Normocephalic, without obvious abnormality, atraumatic Neck: no adenopathy, no carotid bruit, no JVD, supple, symmetrical, trachea midline and thyroid not enlarged, symmetric, no tenderness/mass/nodules Lymph nodes: Cervical, supraclavicular, and axillary nodes normal. Resp: diminished breath sounds bibasilar Back: symmetric, no curvature. ROM normal. No CVA tenderness. Cardio: regular rate and rhythm, S1, S2 normal, no murmur, click, rub or gallop GI: soft, non-tender; bowel sounds normal; no masses,  no organomegaly Extremities: extremities normal, atraumatic, no cyanosis or edema and Homans sign is negative, no sign of DVT Neurologic: Grossly normal Cath in right radial and right groin, right cath sheath removed before I saw the patient   Diagnostic Studies & Laboratory data:     Recent Radiology Findings:   No results found.   I have independently reviewed the above radiologic studies.  Recent Lab Findings: Lab Results  Component Value Date   WBC 6.6 07/03/2016   HGB 13.3 07/03/2016   HCT 39.0 07/03/2016   PLT 206 07/03/2016   GLUCOSE 110 (H) 07/03/2016   NA 138 07/03/2016   K 3.1 (L) 07/03/2016   CL 100 (L) 07/03/2016   CREATININE 0.90 07/03/2016   BUN 22 (H) 07/03/2016   CO2 25 07/03/2016   No results found for: CKTOTAL, CKMB, CKMBINDEX, TROPONINI    Echo: Procedures   Left Heart Cath and Coronary Angiography  Conclusion     Prox RCA to Mid RCA lesion, 95 %stenosed.  Ost RCA to Prox RCA lesion, 30 %stenosed.  Mid RCA lesion, 40 %stenosed.  Prox LAD lesion, 95 %stenosed.  Mid LAD-2 lesion, 75 %stenosed.  Mid LAD-1 lesion, 80 %stenosed.  Ost 1st Mrg to 1st Mrg lesion, 90 %stenosed.  Prox Cx to Mid Cx lesion, 100 %stenosed.  The left ventricular systolic function is normal.  LV end diastolic pressure is normal.  The left  ventricular ejection fraction is 50-55% by visual estimate.   1. Critical 3 vessel obstructive CAD.     - complex 95% proximal LAD, 75-80% diffuse mid LAD    - 90% OM1    - 100% mid LCx with left to left and right to left collaterals    - 95% proximal RCA 2. Good LV function 3. Normal LVEDP  Plan: Patient is currently pain free- will aggressively threat medically with ASA, IV Ntg, IV heparin, beta blocker. Consult CT surgery  for CABG.  Assessment / Plan:   Unstable angina, with severe coronary anatomy  Intolerance to statins  osteoarthritis - on nonsteroidal    With symptoms and sever 3 vessels disease will proceed with cabg in am, risks and options discussed with the patient and his wife. He is agreeable.  The goals risks and alternatives of the planned surgical procedure CABG have been discussed with the patient in detail. The risks of the procedure including death, infection, stroke, myocardial infarction, bleeding, blood transfusion have all been discussed specifically.  I have quoted Blanche East a 3 % of perioperative mortality and a complication rate as high as 40 %. The patient's questions have been answered.HARDEN BRAMER is willing  to proceed with the planned procedure.  I  spent 40 minutes counseling the patient face to face and 50% or more the  time was spent in counseling and coordination of care. The total time spent in the appointment was 60 minutes.    Delight Ovens MD      [address] [place] [place] [address] Office [phone]   Beeper [phone]  07/03/2016 6:58 PM

## 2016-07-04 ENCOUNTER — Inpatient Hospital Stay (HOSPITAL_COMMUNITY): Payer: Medicare Other

## 2016-07-04 ENCOUNTER — Inpatient Hospital Stay (HOSPITAL_COMMUNITY): Payer: Medicare Other | Admitting: Certified Registered Nurse Anesthetist

## 2016-07-04 ENCOUNTER — Encounter (HOSPITAL_COMMUNITY): Payer: Self-pay | Admitting: Cardiology

## 2016-07-04 ENCOUNTER — Encounter (HOSPITAL_COMMUNITY)
Admission: EM | Disposition: A | Discharge status: Home / Self Care | Payer: Self-pay | Source: Home / Self Care | Attending: Cardiothoracic Surgery

## 2016-07-04 DIAGNOSIS — I2129 ST elevation (STEMI) myocardial infarction involving other sites: Secondary | ICD-10-CM

## 2016-07-04 DIAGNOSIS — I25110 Atherosclerotic heart disease of native coronary artery with unstable angina pectoris: Secondary | ICD-10-CM

## 2016-07-04 DIAGNOSIS — I2510 Atherosclerotic heart disease of native coronary artery without angina pectoris: Secondary | ICD-10-CM | POA: Diagnosis present

## 2016-07-04 HISTORY — PX: INTRAOPERATIVE TRANSESOPHAGEAL ECHOCARDIOGRAM: SHX5062

## 2016-07-04 HISTORY — PX: CORONARY ARTERY BYPASS GRAFT: SHX141

## 2016-07-04 LAB — COMPREHENSIVE METABOLIC PANEL
ALT: 41 U/L (ref 17–63)
AST: 110 U/L — ABNORMAL HIGH (ref 15–41)
Albumin: 3.7 g/dL (ref 3.5–5.0)
Alkaline Phosphatase: 55 U/L (ref 38–126)
Anion gap: 10 (ref 5–15)
BUN: 16 mg/dL (ref 6–20)
CO2: 26 mmol/L (ref 22–32)
Calcium: 9.1 mg/dL (ref 8.9–10.3)
Chloride: 102 mmol/L (ref 101–111)
Creatinine, Ser: 0.91 mg/dL (ref 0.61–1.24)
GFR calc Af Amer: >60 mL/min (ref >60)
GFR calc non Af Amer: >60 mL/min (ref >60)
Glucose, Bld: 122 mg/dL — ABNORMAL HIGH (ref 65–99)
Potassium: 3 mmol/L — ABNORMAL LOW (ref 3.5–5.1)
Sodium: 138 mmol/L (ref 135–145)
Total Bilirubin: 0.7 mg/dL (ref 0.3–1.2)
Total Protein: 6.9 g/dL (ref 6.5–8.1)

## 2016-07-04 LAB — POCT I-STAT 3, ART BLOOD GAS (G3+)
ACID-BASE DEFICIT: 3 mmol/L — AB (ref 0.0–2.0)
ACID-BASE DEFICIT: 4 mmol/L — AB (ref 0.0–2.0)
ACID-BASE EXCESS: 1 mmol/L (ref 0.0–2.0)
Acid-base deficit: 2 mmol/L (ref 0.0–2.0)
BICARBONATE: 26.3 mmol/L (ref 20.0–28.0)
BICARBONATE: 21.6 mmol/L (ref 20.0–28.0)
BICARBONATE: 23.7 mmol/L (ref 20.0–28.0)
BICARBONATE: 20.1 mmol/L (ref 20.0–28.0)
Bicarbonate: 23.1 mmol/L (ref 20.0–28.0)
O2 SAT: 100 %
O2 Saturation: 100 %
O2 Saturation: 100 %
O2 Saturation: 100 %
O2 Saturation: 95 %
PCO2 ART: 44.1 mmHg (ref 32.0–48.0)
PCO2 ART: 38.6 mmHg (ref 32.0–48.0)
PO2 ART: 407 mmHg — AB (ref 83.0–108.0)
PO2 ART: 270 mmHg — AB (ref 83.0–108.0)
Patient temperature: 37.3
Patient temperature: 37.5
TCO2: 28 mmol/L (ref 0–100)
TCO2: 23 mmol/L (ref 0–100)
TCO2: 24 mmol/L (ref 0–100)
TCO2: 25 mmol/L (ref 0–100)
TCO2: 21 mmol/L (ref 0–100)
pCO2 arterial: 37.8 mmHg (ref 32.0–48.0)
pCO2 arterial: 33 mmHg (ref 32.0–48.0)
pCO2 arterial: 34 mmHg (ref 32.0–48.0)
pH, Arterial: 7.383 (ref 7.350–7.450)
pH, Arterial: 7.365 (ref 7.350–7.450)
pH, Arterial: 7.379 (ref 7.350–7.450)
pH, Arterial: 7.466 — ABNORMAL HIGH (ref 7.350–7.450)
pH, Arterial: 7.382 (ref 7.350–7.450)
pO2, Arterial: 196 mmHg — ABNORMAL HIGH (ref 83.0–108.0)
pO2, Arterial: 374 mmHg — ABNORMAL HIGH (ref 83.0–108.0)
pO2, Arterial: 79 mmHg — ABNORMAL LOW (ref 83.0–108.0)

## 2016-07-04 LAB — POCT I-STAT, CHEM 8
BUN: 20 mg/dL (ref 6–20)
BUN: 18 mg/dL (ref 6–20)
BUN: 19 mg/dL (ref 6–20)
BUN: 17 mg/dL (ref 6–20)
BUN: 17 mg/dL (ref 6–20)
BUN: 16 mg/dL (ref 6–20)
BUN: 18 mg/dL (ref 6–20)
BUN: 19 mg/dL (ref 6–20)
CALCIUM ION: 1.24 mmol/L (ref 1.15–1.40)
CALCIUM ION: 1.22 mmol/L (ref 1.15–1.40)
CALCIUM ION: 0.95 mmol/L — AB (ref 1.15–1.40)
CALCIUM ION: 0.98 mmol/L — AB (ref 1.15–1.40)
CHLORIDE: 103 mmol/L (ref 101–111)
CHLORIDE: 102 mmol/L (ref 101–111)
CHLORIDE: 102 mmol/L (ref 101–111)
CHLORIDE: 104 mmol/L (ref 101–111)
CREATININE: 0.8 mg/dL (ref 0.61–1.24)
CREATININE: 0.8 mg/dL (ref 0.61–1.24)
CREATININE: 0.7 mg/dL (ref 0.61–1.24)
CREATININE: 0.7 mg/dL (ref 0.61–1.24)
CREATININE: 0.7 mg/dL (ref 0.61–1.24)
CREATININE: 0.8 mg/dL (ref 0.61–1.24)
Calcium, Ion: 1.08 mmol/L — ABNORMAL LOW (ref 1.15–1.40)
Calcium, Ion: 1.06 mmol/L — ABNORMAL LOW (ref 1.15–1.40)
Calcium, Ion: 0.98 mmol/L — ABNORMAL LOW (ref 1.15–1.40)
Calcium, Ion: 1.21 mmol/L (ref 1.15–1.40)
Chloride: 106 mmol/L (ref 101–111)
Chloride: 103 mmol/L (ref 101–111)
Chloride: 106 mmol/L (ref 101–111)
Chloride: 109 mmol/L (ref 101–111)
Creatinine, Ser: 0.7 mg/dL (ref 0.61–1.24)
Creatinine, Ser: 0.8 mg/dL (ref 0.61–1.24)
GLUCOSE: 110 mg/dL — AB (ref 65–99)
GLUCOSE: 143 mg/dL — AB (ref 65–99)
GLUCOSE: 215 mg/dL — AB (ref 65–99)
Glucose, Bld: 118 mg/dL — ABNORMAL HIGH (ref 65–99)
Glucose, Bld: 120 mg/dL — ABNORMAL HIGH (ref 65–99)
Glucose, Bld: 130 mg/dL — ABNORMAL HIGH (ref 65–99)
Glucose, Bld: 206 mg/dL — ABNORMAL HIGH (ref 65–99)
Glucose, Bld: 136 mg/dL — ABNORMAL HIGH (ref 65–99)
HCT: 29 % — ABNORMAL LOW (ref 39.0–52.0)
HCT: 34 % — ABNORMAL LOW (ref 39.0–52.0)
HCT: 22 % — ABNORMAL LOW (ref 39.0–52.0)
HCT: 25 % — ABNORMAL LOW (ref 39.0–52.0)
HEMATOCRIT: 35 % — AB (ref 39.0–52.0)
HEMATOCRIT: 26 % — AB (ref 39.0–52.0)
HEMATOCRIT: 24 % — AB (ref 39.0–52.0)
HEMATOCRIT: 23 % — AB (ref 39.0–52.0)
HEMOGLOBIN: 11.9 g/dL — AB (ref 13.0–17.0)
HEMOGLOBIN: 11.6 g/dL — AB (ref 13.0–17.0)
HEMOGLOBIN: 9.9 g/dL — AB (ref 13.0–17.0)
Hemoglobin: 8.8 g/dL — ABNORMAL LOW (ref 13.0–17.0)
Hemoglobin: 8.2 g/dL — ABNORMAL LOW (ref 13.0–17.0)
Hemoglobin: 7.5 g/dL — ABNORMAL LOW (ref 13.0–17.0)
Hemoglobin: 8.5 g/dL — ABNORMAL LOW (ref 13.0–17.0)
Hemoglobin: 7.8 g/dL — ABNORMAL LOW (ref 13.0–17.0)
POTASSIUM: 4.1 mmol/L (ref 3.5–5.1)
POTASSIUM: 4.2 mmol/L (ref 3.5–5.1)
POTASSIUM: 4.5 mmol/L (ref 3.5–5.1)
POTASSIUM: 4.4 mmol/L (ref 3.5–5.1)
POTASSIUM: 4.2 mmol/L (ref 3.5–5.1)
Potassium: 3.8 mmol/L (ref 3.5–5.1)
Potassium: 3.7 mmol/L (ref 3.5–5.1)
Potassium: 3.7 mmol/L (ref 3.5–5.1)
Sodium: 141 mmol/L (ref 135–145)
Sodium: 141 mmol/L (ref 135–145)
Sodium: 141 mmol/L (ref 135–145)
Sodium: 137 mmol/L (ref 135–145)
Sodium: 139 mmol/L (ref 135–145)
Sodium: 139 mmol/L (ref 135–145)
Sodium: 141 mmol/L (ref 135–145)
Sodium: 139 mmol/L (ref 135–145)
TCO2: 27 mmol/L (ref 0–100)
TCO2: 28 mmol/L (ref 0–100)
TCO2: 28 mmol/L (ref 0–100)
TCO2: 27 mmol/L (ref 0–100)
TCO2: 26 mmol/L (ref 0–100)
TCO2: 24 mmol/L (ref 0–100)
TCO2: 25 mmol/L (ref 0–100)
TCO2: 23 mmol/L (ref 0–100)

## 2016-07-04 LAB — GLUCOSE, CAPILLARY
GLUCOSE-CAPILLARY: 111 mg/dL — AB (ref 65–99)
GLUCOSE-CAPILLARY: 130 mg/dL — AB (ref 65–99)
GLUCOSE-CAPILLARY: 118 mg/dL — AB (ref 65–99)
Glucose-Capillary: 112 mg/dL — ABNORMAL HIGH (ref 65–99)
Glucose-Capillary: 121 mg/dL — ABNORMAL HIGH (ref 65–99)
Glucose-Capillary: 117 mg/dL — ABNORMAL HIGH (ref 65–99)
Glucose-Capillary: 112 mg/dL — ABNORMAL HIGH (ref 65–99)
Glucose-Capillary: 105 mg/dL — ABNORMAL HIGH (ref 65–99)

## 2016-07-04 LAB — URINALYSIS, ROUTINE W REFLEX MICROSCOPIC
Bilirubin Urine: NEGATIVE
Glucose, UA: NEGATIVE mg/dL
Hgb urine dipstick: NEGATIVE
Ketones, ur: 20 mg/dL — AB
Leukocytes, UA: NEGATIVE
Nitrite: NEGATIVE
Protein, ur: NEGATIVE mg/dL
Specific Gravity, Urine: 1.018 (ref 1.005–1.030)
pH: 7 (ref 5.0–8.0)

## 2016-07-04 LAB — BASIC METABOLIC PANEL
ANION GAP: 7 (ref 5–15)
BUN: 16 mg/dL (ref 6–20)
CALCIUM: 8.7 mg/dL — AB (ref 8.9–10.3)
CO2: 25 mmol/L (ref 22–32)
Chloride: 106 mmol/L (ref 101–111)
Creatinine, Ser: 0.88 mg/dL (ref 0.61–1.24)
GFR calc Af Amer: >60 mL/min (ref >60)
Glucose, Bld: 106 mg/dL — ABNORMAL HIGH (ref 65–99)
POTASSIUM: 3.3 mmol/L — AB (ref 3.5–5.1)
Sodium: 138 mmol/L (ref 135–145)

## 2016-07-04 LAB — LIPID PANEL
CHOLESTEROL: 241 mg/dL — AB (ref 0–200)
Cholesterol: 248 mg/dL — ABNORMAL HIGH (ref 0–200)
HDL: 23 mg/dL — ABNORMAL LOW (ref >40)
HDL: 23 mg/dL — ABNORMAL LOW (ref >40)
LDL Cholesterol: 190 mg/dL — ABNORMAL HIGH (ref 0–99)
LDL Cholesterol: 199 mg/dL — ABNORMAL HIGH (ref 0–99)
TRIGLYCERIDES: 129 mg/dL (ref <150)
Total CHOL/HDL Ratio: 10.5 RATIO
Total CHOL/HDL Ratio: 10.8 RATIO
Triglycerides: 141 mg/dL (ref <150)
VLDL: 28 mg/dL (ref 0–40)
VLDL: 26 mg/dL (ref 0–40)

## 2016-07-04 LAB — CBC
HCT: 27.8 % — ABNORMAL LOW (ref 39.0–52.0)
HCT: 25.9 % — ABNORMAL LOW (ref 39.0–52.0)
Hemoglobin: 9.2 g/dL — ABNORMAL LOW (ref 13.0–17.0)
Hemoglobin: 8.7 g/dL — ABNORMAL LOW (ref 13.0–17.0)
MCH: 28.7 pg (ref 26.0–34.0)
MCH: 28.9 pg (ref 26.0–34.0)
MCHC: 33.1 g/dL (ref 30.0–36.0)
MCHC: 33.6 g/dL (ref 30.0–36.0)
MCV: 86.6 fL (ref 78.0–100.0)
MCV: 86 fL (ref 78.0–100.0)
PLATELETS: 142 10*3/uL — AB (ref 150–400)
Platelets: 128 10*3/uL — ABNORMAL LOW (ref 150–400)
RBC: 3.21 MIL/uL — AB (ref 4.22–5.81)
RBC: 3.01 MIL/uL — ABNORMAL LOW (ref 4.22–5.81)
RDW: 13.9 % (ref 11.5–15.5)
RDW: 13.9 % (ref 11.5–15.5)
WBC: 14.7 10*3/uL — AB (ref 4.0–10.5)
WBC: 11.6 10*3/uL — ABNORMAL HIGH (ref 4.0–10.5)

## 2016-07-04 LAB — APTT
aPTT: 31 seconds (ref 24–36)
aPTT: 34 seconds (ref 24–36)

## 2016-07-04 LAB — VAS US DOPPLER PRE CABG
LEFT ECA DIAS: -16 cm/s
LEFT VERTEBRAL DIAS: -21 cm/s
Left CCA dist dias: -23 cm/s
Left CCA dist sys: -88 cm/s
Left CCA prox dias: 21 cm/s
Left CCA prox sys: 90 cm/s
Left ICA dist dias: -20 cm/s
Left ICA dist sys: -50 cm/s
Left ICA prox dias: -25 cm/s
Left ICA prox sys: -73 cm/s
RIGHT ECA DIAS: -11 cm/s
RIGHT VERTEBRAL DIAS: -17 cm/s
Right CCA prox dias: -16 cm/s
Right CCA prox sys: -90 cm/s
Right cca dist sys: -73 cm/s

## 2016-07-04 LAB — POCT I-STAT 4, (NA,K, GLUC, HGB,HCT)
Glucose, Bld: 152 mg/dL — ABNORMAL HIGH (ref 65–99)
HEMATOCRIT: 27 % — AB (ref 39.0–52.0)
HEMOGLOBIN: 9.2 g/dL — AB (ref 13.0–17.0)
Potassium: 3.6 mmol/L (ref 3.5–5.1)
SODIUM: 142 mmol/L (ref 135–145)

## 2016-07-04 LAB — HEMOGLOBIN AND HEMATOCRIT, BLOOD
HCT: 26.7 % — ABNORMAL LOW (ref 39.0–52.0)
Hemoglobin: 8.9 g/dL — ABNORMAL LOW (ref 13.0–17.0)

## 2016-07-04 LAB — PROTIME-INR
INR: 1.09
INR: 1.77
PROTHROMBIN TIME: 20.8 s — AB (ref 11.4–15.2)
Prothrombin Time: 14.2 seconds (ref 11.4–15.2)

## 2016-07-04 LAB — CREATININE, SERUM
Creatinine, Ser: 0.9 mg/dL (ref 0.61–1.24)
GFR calc Af Amer: >60 mL/min (ref >60)
GFR calc non Af Amer: >60 mL/min (ref >60)

## 2016-07-04 LAB — HEMOGLOBIN A1C
Hgb A1c MFr Bld: 5.5 % (ref 4.8–5.6)
Mean Plasma Glucose: 111 mg/dL

## 2016-07-04 LAB — MAGNESIUM: Magnesium: 2.8 mg/dL — ABNORMAL HIGH (ref 1.7–2.4)

## 2016-07-04 LAB — PLATELET COUNT: Platelets: 179 10*3/uL (ref 150–400)

## 2016-07-04 LAB — TROPONIN I
TROPONIN I: 10.43 ng/mL — AB (ref <0.03)
Troponin I: 24.97 ng/mL (ref <0.03)

## 2016-07-04 SURGERY — CORONARY ARTERY BYPASS GRAFTING (CABG)
Anesthesia: General | Site: Chest

## 2016-07-04 MED ORDER — ONDANSETRON HCL 4 MG/2ML IJ SOLN
4.0000 mg | Freq: Four times a day (QID) | INTRAMUSCULAR | Status: DC | PRN
  Administered 2016-07-05 – 2016-07-06 (×3): 4 mg via INTRAVENOUS
  Filled 2016-07-04 (×3): qty 2

## 2016-07-04 MED ORDER — SODIUM CHLORIDE 0.9 % IV SOLN
30.0000 meq | Freq: Once | INTRAVENOUS | Status: AC
  Administered 2016-07-04: 30 meq via INTRAVENOUS
  Filled 2016-07-04: qty 15

## 2016-07-04 MED ORDER — DEXMEDETOMIDINE HCL IN NACL 200 MCG/50ML IV SOLN
0.0000 ug/kg/h | INTRAVENOUS | Status: DC
  Administered 2016-07-04: 0.5 ug/kg/h via INTRAVENOUS
  Filled 2016-07-04 (×2): qty 50

## 2016-07-04 MED ORDER — PROTAMINE SULFATE 10 MG/ML IV SOLN
INTRAVENOUS | Status: DC | PRN
  Administered 2016-07-04 (×5): 40 mg via INTRAVENOUS

## 2016-07-04 MED ORDER — SODIUM CHLORIDE 0.9 % IV SOLN: INTRAVENOUS | Status: DC

## 2016-07-04 MED ORDER — MORPHINE SULFATE (PF) 2 MG/ML IV SOLN
2.0000 mg | INTRAVENOUS | Status: DC | PRN
  Administered 2016-07-05: 2 mg via INTRAVENOUS
  Administered 2016-07-05 – 2016-07-06 (×2): 4 mg via INTRAVENOUS
  Filled 2016-07-04 (×2): qty 2
  Filled 2016-07-04: qty 1
  Filled 2016-07-04: qty 2
  Filled 2016-07-04: qty 1

## 2016-07-04 MED ORDER — GELATIN ABSORBABLE MT POWD
OROMUCOSAL | Status: DC | PRN
  Administered 2016-07-04: 4 mL via TOPICAL

## 2016-07-04 MED ORDER — ASPIRIN 81 MG PO CHEW: 324.0000 mg | CHEWABLE_TABLET | Freq: Every day | ORAL | Status: DC

## 2016-07-04 MED ORDER — NITROGLYCERIN IN D5W 200-5 MCG/ML-% IV SOLN: 0.0000 ug/min | INTRAVENOUS | Status: DC

## 2016-07-04 MED ORDER — ACETAMINOPHEN 160 MG/5ML PO SOLN: 650.0000 mg | Freq: Once | ORAL | Status: AC

## 2016-07-04 MED ORDER — ROCURONIUM BROMIDE 100 MG/10ML IV SOLN
INTRAVENOUS | Status: DC | PRN
  Administered 2016-07-04 (×5): 50 mg via INTRAVENOUS

## 2016-07-04 MED ORDER — ACETAMINOPHEN 500 MG PO TABS
1000.0000 mg | ORAL_TABLET | Freq: Four times a day (QID) | ORAL | Status: DC
  Administered 2016-07-04 – 2016-07-07 (×8): 1000 mg via ORAL
  Filled 2016-07-04 (×8): qty 2

## 2016-07-04 MED ORDER — METOPROLOL TARTRATE 5 MG/5ML IV SOLN
INTRAVENOUS | Status: DC | PRN
  Administered 2016-07-04: 1 mg via INTRAVENOUS
  Administered 2016-07-04: .5 mg via INTRAVENOUS
  Administered 2016-07-04: 1 mg via INTRAVENOUS

## 2016-07-04 MED ORDER — 0.9 % SODIUM CHLORIDE (POUR BTL) OPTIME
TOPICAL | Status: DC | PRN
  Administered 2016-07-04: 5000 mL

## 2016-07-04 MED ORDER — VASOPRESSIN 20 UNIT/ML IV SOLN
INTRAVENOUS | Status: DC | PRN
  Administered 2016-07-04: 1 [IU] via INTRAVENOUS
  Administered 2016-07-04: 2 [IU] via INTRAVENOUS

## 2016-07-04 MED ORDER — LACTATED RINGERS IV SOLN
INTRAVENOUS | Status: DC | PRN
  Administered 2016-07-04: 08:00:00 via INTRAVENOUS

## 2016-07-04 MED ORDER — LACTATED RINGERS IV SOLN
INTRAVENOUS | Status: DC | PRN
  Administered 2016-07-04 (×2): via INTRAVENOUS

## 2016-07-04 MED ORDER — SODIUM CHLORIDE 0.9 % IV SOLN
INTRAVENOUS | Status: DC | PRN
  Administered 2016-07-04 (×2): 1.5 mg/kg/h via INTRAVENOUS

## 2016-07-04 MED ORDER — HEMOSTATIC AGENTS (NO CHARGE) OPTIME
TOPICAL | Status: DC | PRN
  Administered 2016-07-04: 1 via TOPICAL

## 2016-07-04 MED ORDER — NITROGLYCERIN IN D5W 200-5 MCG/ML-% IV SOLN
INTRAVENOUS | Status: DC | PRN
  Administered 2016-07-04: 5 ug/min via INTRAVENOUS

## 2016-07-04 MED ORDER — LIDOCAINE 2% (20 MG/ML) 5 ML SYRINGE
INTRAMUSCULAR | Status: AC
  Filled 2016-07-04: qty 5

## 2016-07-04 MED ORDER — FENTANYL CITRATE (PF) 250 MCG/5ML IJ SOLN
INTRAMUSCULAR | Status: DC | PRN
  Administered 2016-07-04: 250 ug via INTRAVENOUS
  Administered 2016-07-04: 150 ug via INTRAVENOUS
  Administered 2016-07-04: 250 ug via INTRAVENOUS
  Administered 2016-07-04: 100 ug via INTRAVENOUS
  Administered 2016-07-04 (×2): 250 ug via INTRAVENOUS

## 2016-07-04 MED ORDER — LACTATED RINGERS IV SOLN
INTRAVENOUS | Status: DC | PRN
  Administered 2016-07-04: 07:00:00 via INTRAVENOUS

## 2016-07-04 MED ORDER — ORAL CARE MOUTH RINSE
15.0000 mL | Freq: Two times a day (BID) | OROMUCOSAL | Status: DC
  Administered 2016-07-04 – 2016-07-09 (×8): 15 mL via OROMUCOSAL

## 2016-07-04 MED ORDER — DIPHENHYDRAMINE HCL 50 MG/ML IJ SOLN
25.0000 mg | Freq: Once | INTRAMUSCULAR | Status: AC
  Administered 2016-07-04: 25 mg via INTRAVENOUS

## 2016-07-04 MED ORDER — MILRINONE LACTATE IN DEXTROSE 20-5 MG/100ML-% IV SOLN: 0.3000 ug/kg/min | INTRAVENOUS | Status: DC

## 2016-07-04 MED ORDER — ALBUMIN HUMAN 5 % IV SOLN
INTRAVENOUS | Status: DC | PRN
  Administered 2016-07-04 (×3): via INTRAVENOUS

## 2016-07-04 MED ORDER — DEXTROSE 5 % IV SOLN: 1.5000 g | INTRAVENOUS | Status: DC

## 2016-07-04 MED ORDER — ASPIRIN EC 325 MG PO TBEC
325.0000 mg | DELAYED_RELEASE_TABLET | Freq: Every day | ORAL | Status: DC
  Administered 2016-07-05 – 2016-07-06 (×2): 325 mg via ORAL
  Filled 2016-07-04 (×2): qty 1

## 2016-07-04 MED ORDER — ROCURONIUM BROMIDE 50 MG/5ML IV SOSY
PREFILLED_SYRINGE | INTRAVENOUS | Status: AC
  Filled 2016-07-04: qty 5

## 2016-07-04 MED ORDER — VANCOMYCIN HCL 1000 MG IV SOLR
INTRAVENOUS | Status: DC | PRN
  Administered 2016-07-04: 1500 mg via INTRAVENOUS

## 2016-07-04 MED ORDER — OXYCODONE HCL 5 MG PO TABS
5.0000 mg | ORAL_TABLET | ORAL | Status: DC | PRN
  Administered 2016-07-05: 10 mg via ORAL
  Administered 2016-07-06: 5 mg via ORAL
  Administered 2016-07-06: 10 mg via ORAL
  Administered 2016-07-07: 5 mg via ORAL
  Filled 2016-07-04: qty 1
  Filled 2016-07-04 (×3): qty 2
  Filled 2016-07-04: qty 1

## 2016-07-04 MED ORDER — DOPAMINE-DEXTROSE 3.2-5 MG/ML-% IV SOLN
INTRAVENOUS | Status: DC | PRN
  Administered 2016-07-04: 3 ug/kg/min via INTRAVENOUS

## 2016-07-04 MED ORDER — BISACODYL 5 MG PO TBEC
10.0000 mg | DELAYED_RELEASE_TABLET | Freq: Every day | ORAL | Status: DC
  Administered 2016-07-05: 10 mg via ORAL
  Filled 2016-07-04: qty 2

## 2016-07-04 MED ORDER — METOPROLOL TARTRATE 12.5 MG HALF TABLET: 12.5000 mg | ORAL_TABLET | Freq: Two times a day (BID) | ORAL | Status: DC

## 2016-07-04 MED ORDER — BISACODYL 10 MG RE SUPP: 10.0000 mg | Freq: Every day | RECTAL | Status: DC

## 2016-07-04 MED ORDER — DEXTROSE 5 % IV SOLN
1.5000 g | Freq: Two times a day (BID) | INTRAVENOUS | Status: AC
  Administered 2016-07-04 – 2016-07-06 (×4): 1.5 g via INTRAVENOUS
  Filled 2016-07-04 (×4): qty 1.5

## 2016-07-04 MED ORDER — CALCIUM CHLORIDE 10 % IV SOLN
1.0000 g | Freq: Once | INTRAVENOUS | Status: AC
  Administered 2016-07-04: 1 g via INTRAVENOUS

## 2016-07-04 MED ORDER — LIDOCAINE HCL (CARDIAC) 20 MG/ML IV SOLN
INTRAVENOUS | Status: DC | PRN
  Administered 2016-07-04: 60 mg via INTRAVENOUS

## 2016-07-04 MED ORDER — SODIUM CHLORIDE 0.9 % IV SOLN: 250.0000 mL | INTRAVENOUS | Status: DC

## 2016-07-04 MED ORDER — SODIUM CHLORIDE 0.9% FLUSH
3.0000 mL | Freq: Two times a day (BID) | INTRAVENOUS | Status: DC
  Administered 2016-07-05 – 2016-07-06 (×2): 3 mL via INTRAVENOUS
  Administered 2016-07-06: 10 mL via INTRAVENOUS
  Administered 2016-07-06: 3 mL via INTRAVENOUS

## 2016-07-04 MED ORDER — ALBUMIN HUMAN 5 % IV SOLN
250.0000 mL | INTRAVENOUS | Status: AC | PRN
  Administered 2016-07-04 – 2016-07-05 (×4): 250 mL via INTRAVENOUS
  Filled 2016-07-04 (×2): qty 250

## 2016-07-04 MED ORDER — NOREPINEPHRINE BITARTRATE 1 MG/ML IV SOLN
0.0000 ug/min | INTRAVENOUS | Status: AC
  Administered 2016-07-04: 4 ug/min via INTRAVENOUS
  Filled 2016-07-04: qty 4

## 2016-07-04 MED ORDER — FENTANYL CITRATE (PF) 250 MCG/5ML IJ SOLN
INTRAMUSCULAR | Status: AC
Start: 2016-07-04 — End: 2016-07-04
  Filled 2016-07-04: qty 20

## 2016-07-04 MED ORDER — PLASMA-LYTE 148 IV SOLN
INTRAVENOUS | Status: DC | PRN
  Administered 2016-07-04: 500 mL via INTRAVASCULAR

## 2016-07-04 MED ORDER — VANCOMYCIN HCL IN DEXTROSE 1-5 GM/200ML-% IV SOLN
1000.0000 mg | Freq: Once | INTRAVENOUS | Status: AC
  Administered 2016-07-04: 1000 mg via INTRAVENOUS
  Filled 2016-07-04: qty 200

## 2016-07-04 MED ORDER — SODIUM CHLORIDE 0.9% FLUSH: 3.0000 mL | INTRAVENOUS | Status: DC | PRN

## 2016-07-04 MED ORDER — SODIUM CHLORIDE 0.45 % IV SOLN: INTRAVENOUS | Status: DC | PRN

## 2016-07-04 MED ORDER — MIDAZOLAM HCL 10 MG/2ML IJ SOLN
INTRAMUSCULAR | Status: AC
  Filled 2016-07-04: qty 2

## 2016-07-04 MED ORDER — FAMOTIDINE IN NACL 20-0.9 MG/50ML-% IV SOLN
20.0000 mg | Freq: Two times a day (BID) | INTRAVENOUS | Status: AC
  Administered 2016-07-04: 20 mg via INTRAVENOUS

## 2016-07-04 MED ORDER — MIDAZOLAM HCL 2 MG/2ML IJ SOLN: 2.0000 mg | INTRAMUSCULAR | Status: DC | PRN

## 2016-07-04 MED ORDER — PHENYLEPHRINE HCL 10 MG/ML IJ SOLN
INTRAMUSCULAR | Status: AC
  Filled 2016-07-04: qty 1

## 2016-07-04 MED ORDER — SODIUM CHLORIDE 0.9 % IV SOLN
INTRAVENOUS | Status: DC | PRN
  Administered 2016-07-04: 1 [IU]/h via INTRAVENOUS

## 2016-07-04 MED ORDER — DOCUSATE SODIUM 100 MG PO CAPS
200.0000 mg | ORAL_CAPSULE | Freq: Every day | ORAL | Status: DC
  Administered 2016-07-05 – 2016-07-06 (×2): 200 mg via ORAL
  Filled 2016-07-04 (×2): qty 2

## 2016-07-04 MED ORDER — PHENYLEPHRINE HCL 10 MG/ML IJ SOLN
INTRAMUSCULAR | Status: DC | PRN
  Administered 2016-07-04: 30 ug/min via INTRAVENOUS

## 2016-07-04 MED ORDER — ACETAMINOPHEN 650 MG RE SUPP
650.0000 mg | Freq: Once | RECTAL | Status: AC
  Administered 2016-07-04: 650 mg via RECTAL

## 2016-07-04 MED ORDER — PHENYLEPHRINE HCL 10 MG/ML IJ SOLN
0.0000 ug/min | INTRAMUSCULAR | Status: DC
  Administered 2016-07-04: 40 ug/min via INTRAVENOUS
  Filled 2016-07-04 (×2): qty 2

## 2016-07-04 MED ORDER — HEPARIN SODIUM (PORCINE) 1000 UNIT/ML IJ SOLN
INTRAMUSCULAR | Status: DC | PRN
  Administered 2016-07-04: 50 mL via INTRAVENOUS

## 2016-07-04 MED ORDER — ROCURONIUM BROMIDE 50 MG/5ML IV SOSY
PREFILLED_SYRINGE | INTRAVENOUS | Status: AC
  Filled 2016-07-04: qty 15

## 2016-07-04 MED ORDER — PANTOPRAZOLE SODIUM 40 MG PO TBEC
40.0000 mg | DELAYED_RELEASE_TABLET | Freq: Every day | ORAL | Status: DC
  Administered 2016-07-06: 40 mg via ORAL
  Filled 2016-07-04: qty 1

## 2016-07-04 MED ORDER — PHENYLEPHRINE HCL 10 MG/ML IJ SOLN
INTRAMUSCULAR | Status: DC | PRN
  Administered 2016-07-04: 80 ug via INTRAVENOUS

## 2016-07-04 MED ORDER — TRANEXAMIC ACID (OHS) BOLUS VIA INFUSION
15.0000 mg/kg | INTRAVENOUS | Status: AC
  Administered 2016-07-04: 1740 mg via INTRAVENOUS

## 2016-07-04 MED ORDER — POTASSIUM CHLORIDE CRYS ER 20 MEQ PO TBCR
40.0000 meq | EXTENDED_RELEASE_TABLET | Freq: Once | ORAL | Status: AC
  Administered 2016-07-04: 40 meq via ORAL
  Filled 2016-07-04: qty 2

## 2016-07-04 MED ORDER — DEXMEDETOMIDINE HCL 200 MCG/2ML IV SOLN
INTRAVENOUS | Status: DC | PRN
  Administered 2016-07-04: 0.2 ug/kg/h via INTRAVENOUS

## 2016-07-04 MED ORDER — CHLORHEXIDINE GLUCONATE 0.12 % MT SOLN: 15.0000 mL | OROMUCOSAL | Status: DC

## 2016-07-04 MED ORDER — SODIUM CHLORIDE 0.9 % IJ SOLN
OROMUCOSAL | Status: DC | PRN
  Administered 2016-07-04: 4 mL via TOPICAL

## 2016-07-04 MED ORDER — PROPOFOL 10 MG/ML IV BOLUS
INTRAVENOUS | Status: DC | PRN
  Administered 2016-07-04: 20 mg via INTRAVENOUS
  Administered 2016-07-04: 50 mg via INTRAVENOUS

## 2016-07-04 MED ORDER — METOPROLOL TARTRATE 25 MG/10 ML ORAL SUSPENSION: 12.5000 mg | Freq: Two times a day (BID) | ORAL | Status: DC

## 2016-07-04 MED ORDER — SODIUM CHLORIDE 0.9 % IV SOLN
INTRAVENOUS | Status: DC
  Administered 2016-07-04: 3 [IU]/h via INTRAVENOUS
  Filled 2016-07-04 (×2): qty 2.5

## 2016-07-04 MED ORDER — MILRINONE LACTATE IN DEXTROSE 20-5 MG/100ML-% IV SOLN
0.1250 ug/kg/min | INTRAVENOUS | Status: AC
  Administered 2016-07-04: .3 ug/kg/min via INTRAVENOUS

## 2016-07-04 MED ORDER — INSULIN REGULAR BOLUS VIA INFUSION
0.0000 [IU] | Freq: Three times a day (TID) | INTRAVENOUS | Status: DC
  Filled 2016-07-04: qty 10

## 2016-07-04 MED ORDER — MAGNESIUM SULFATE 4 GM/100ML IV SOLN
4.0000 g | Freq: Once | INTRAVENOUS | Status: AC
  Administered 2016-07-04: 4 g via INTRAVENOUS
  Filled 2016-07-04: qty 100

## 2016-07-04 MED ORDER — TRANEXAMIC ACID 1000 MG/10ML IV SOLN
INTRAVENOUS | Status: DC
  Filled 2016-07-04: qty 100

## 2016-07-04 MED ORDER — FENTANYL CITRATE (PF) 250 MCG/5ML IJ SOLN
INTRAMUSCULAR | Status: AC
  Filled 2016-07-04: qty 10

## 2016-07-04 MED ORDER — LACTATED RINGERS IV SOLN
INTRAVENOUS | Status: DC
  Administered 2016-07-06: 20 mL/h via INTRAVENOUS

## 2016-07-04 MED ORDER — ACETAMINOPHEN 160 MG/5ML PO SOLN: 1000.0000 mg | Freq: Four times a day (QID) | ORAL | Status: DC

## 2016-07-04 MED ORDER — LACTATED RINGERS IV SOLN: INTRAVENOUS | Status: DC

## 2016-07-04 MED ORDER — ORAL CARE MOUTH RINSE
15.0000 mL | OROMUCOSAL | Status: DC
  Administered 2016-07-04: 15 mL via OROMUCOSAL

## 2016-07-04 MED ORDER — DEXTROSE 5 % IV SOLN
INTRAVENOUS | Status: DC | PRN
  Administered 2016-07-04: 1.5 g via INTRAVENOUS
  Administered 2016-07-04: .5 g via INTRAVENOUS

## 2016-07-04 MED ORDER — MIDAZOLAM HCL 5 MG/5ML IJ SOLN
INTRAMUSCULAR | Status: DC | PRN
  Administered 2016-07-04 (×2): 2 mg via INTRAVENOUS
  Administered 2016-07-04: 1 mg via INTRAVENOUS
  Administered 2016-07-04: 4 mg via INTRAVENOUS
  Administered 2016-07-04: 1 mg via INTRAVENOUS

## 2016-07-04 MED ORDER — TRAMADOL HCL 50 MG PO TABS: 50.0000 mg | ORAL_TABLET | ORAL | Status: DC | PRN

## 2016-07-04 MED ORDER — METOPROLOL TARTRATE 5 MG/5ML IV SOLN: 2.5000 mg | INTRAVENOUS | Status: DC | PRN

## 2016-07-04 MED ORDER — EPHEDRINE SULFATE 50 MG/ML IJ SOLN
INTRAMUSCULAR | Status: DC | PRN
  Administered 2016-07-04: 10 mg via INTRAVENOUS

## 2016-07-04 MED ORDER — DOPAMINE-DEXTROSE 3.2-5 MG/ML-% IV SOLN: 0.0000 ug/kg/min | INTRAVENOUS | Status: DC

## 2016-07-04 MED ORDER — LACTATED RINGERS IV SOLN: 500.0000 mL | Freq: Once | INTRAVENOUS | Status: DC | PRN

## 2016-07-04 MED ORDER — DOPAMINE-DEXTROSE 3.2-5 MG/ML-% IV SOLN
0.0000 ug/kg/min | INTRAVENOUS | Status: DC
  Filled 2016-07-04: qty 250

## 2016-07-04 MED ORDER — PHENYLEPHRINE 40 MCG/ML (10ML) SYRINGE FOR IV PUSH (FOR BLOOD PRESSURE SUPPORT)
PREFILLED_SYRINGE | INTRAVENOUS | Status: AC
  Filled 2016-07-04: qty 10

## 2016-07-04 MED ORDER — PROPOFOL 10 MG/ML IV BOLUS
INTRAVENOUS | Status: AC
  Filled 2016-07-04: qty 40

## 2016-07-04 MED ORDER — MORPHINE SULFATE (PF) 2 MG/ML IV SOLN
1.0000 mg | INTRAVENOUS | Status: AC | PRN
  Administered 2016-07-04: 4 mg via INTRAVENOUS
  Administered 2016-07-04: 2 mg via INTRAVENOUS
  Filled 2016-07-04 (×2): qty 1

## 2016-07-04 MED FILL — Potassium Chloride Inj 2 mEq/ML: INTRAVENOUS | Qty: 40 | Status: AC

## 2016-07-04 MED FILL — Heparin Sodium (Porcine) Inj 1000 Unit/ML: INTRAMUSCULAR | Qty: 30 | Status: AC

## 2016-07-04 MED FILL — Magnesium Sulfate Inj 50%: INTRAMUSCULAR | Qty: 10 | Status: AC

## 2016-07-04 SURGICAL SUPPLY — 82 items
ADAPTER CARDIO PERF ANTE/RETRO (ADAPTER) ×3 IMPLANT
BAG DECANTER FOR FLEXI CONT (MISCELLANEOUS) ×3 IMPLANT
BANDAGE ACE 4X5 VEL STRL LF (GAUZE/BANDAGES/DRESSINGS) ×3 IMPLANT
BANDAGE ACE 6X5 VEL STRL LF (GAUZE/BANDAGES/DRESSINGS) ×3 IMPLANT
BLADE 11 SAFETY STRL DISP (BLADE) ×3 IMPLANT
BLADE STERNUM SYSTEM 6 (BLADE) ×6 IMPLANT
BNDG GAUZE ELAST 4 BULKY (GAUZE/BANDAGES/DRESSINGS) ×3 IMPLANT
CANISTER SUCT 3000ML PPV (MISCELLANEOUS) ×3 IMPLANT
CANNULA GUNDRY RCSP 15FR (MISCELLANEOUS) ×3 IMPLANT
CATH CPB KIT GERHARDT (MISCELLANEOUS) ×3 IMPLANT
CATH THORACIC 28FR (CATHETERS) ×3 IMPLANT
CLIP FOGARTY SPRING 6M (CLIP) ×3 IMPLANT
CLIP TI WIDE RED SMALL 24 (CLIP) ×3 IMPLANT
COVER PROBE W GEL 5X96 (DRAPES) ×3 IMPLANT
CRADLE DONUT ADULT HEAD (MISCELLANEOUS) ×3 IMPLANT
DERMABOND ADVANCED (GAUZE/BANDAGES/DRESSINGS) ×1
DERMABOND ADVANCED .7 DNX12 (GAUZE/BANDAGES/DRESSINGS) ×2 IMPLANT
DRAIN CHANNEL 28F RND 3/8 FF (WOUND CARE) ×3 IMPLANT
DRAPE CARDIOVASCULAR INCISE (DRAPES) ×1
DRAPE SLUSH/WARMER DISC (DRAPES) ×3 IMPLANT
DRAPE SRG 135X102X78XABS (DRAPES) ×2 IMPLANT
DRSG AQUACEL AG ADV 3.5X14 (GAUZE/BANDAGES/DRESSINGS) ×3 IMPLANT
ELECT BLADE 4.0 EZ CLEAN MEGAD (MISCELLANEOUS) ×3
ELECT REM PT RETURN 9FT ADLT (ELECTROSURGICAL) ×6
ELECTRODE BLDE 4.0 EZ CLN MEGD (MISCELLANEOUS) ×2 IMPLANT
ELECTRODE REM PT RTRN 9FT ADLT (ELECTROSURGICAL) ×4 IMPLANT
FELT TEFLON 1X6 (MISCELLANEOUS) ×6 IMPLANT
GAUZE SPONGE 4X4 12PLY STRL (GAUZE/BANDAGES/DRESSINGS) ×6 IMPLANT
GLOVE BIO SURGEON STRL SZ 6.5 (GLOVE) ×27 IMPLANT
GLOVE BIO SURGEON STRL SZ7 (GLOVE) ×6 IMPLANT
GLOVE BIO SURGEON STRL SZ7.5 (GLOVE) ×3 IMPLANT
GOWN STRL REUS W/ TWL LRG LVL3 (GOWN DISPOSABLE) ×16 IMPLANT
GOWN STRL REUS W/TWL LRG LVL3 (GOWN DISPOSABLE) ×8
HEMOSTAT POWDER SURGIFOAM 1G (HEMOSTASIS) ×9 IMPLANT
HEMOSTAT SURGICEL 2X14 (HEMOSTASIS) ×3 IMPLANT
KIT BASIN OR (CUSTOM PROCEDURE TRAY) ×3 IMPLANT
KIT CATH SUCT 8FR (CATHETERS) ×3 IMPLANT
KIT ROOM TURNOVER OR (KITS) ×3 IMPLANT
KIT SUCTION CATH 14FR (SUCTIONS) ×9 IMPLANT
KIT VASOVIEW HEMOPRO VH 3000 (KITS) ×3 IMPLANT
LEAD PACING MYOCARDI (MISCELLANEOUS) ×3 IMPLANT
MARKER GRAFT CORONARY BYPASS (MISCELLANEOUS) ×9 IMPLANT
NS IRRIG 1000ML POUR BTL (IV SOLUTION) ×15 IMPLANT
PACK OPEN HEART (CUSTOM PROCEDURE TRAY) ×3 IMPLANT
PAD ARMBOARD 7.5X6 YLW CONV (MISCELLANEOUS) ×6 IMPLANT
PAD ELECT DEFIB RADIOL ZOLL (MISCELLANEOUS) ×3 IMPLANT
PENCIL BUTTON HOLSTER BLD 10FT (ELECTRODE) ×3 IMPLANT
PUNCH AORTIC ROTATE  4.5MM 8IN (MISCELLANEOUS) ×3 IMPLANT
SET CARDIOPLEGIA MPS 5001102 (MISCELLANEOUS) ×3 IMPLANT
SPOGE SURGIFLO 8M (HEMOSTASIS) ×1
SPONGE GAUZE 4X4 12PLY STER LF (GAUZE/BANDAGES/DRESSINGS) ×3 IMPLANT
SPONGE LAP 18X18 X RAY DECT (DISPOSABLE) ×15 IMPLANT
SPONGE SURGIFLO 8M (HEMOSTASIS) ×2 IMPLANT
SUT BONE WAX W31G (SUTURE) ×3 IMPLANT
SUT MNCRL AB 4-0 PS2 18 (SUTURE) ×6 IMPLANT
SUT PROLENE 3 0 SH 1 (SUTURE) ×3 IMPLANT
SUT PROLENE 3 0 SH1 36 (SUTURE) ×3 IMPLANT
SUT PROLENE 4 0 TF (SUTURE) ×6 IMPLANT
SUT PROLENE 6 0 C 1 30 (SUTURE) ×12 IMPLANT
SUT PROLENE 6 0 CC (SUTURE) ×12 IMPLANT
SUT PROLENE 7 0 BV1 MDA (SUTURE) ×12 IMPLANT
SUT PROLENE 8 0 BV175 6 (SUTURE) ×27 IMPLANT
SUT SILK 2 0 SH CR/8 (SUTURE) ×3 IMPLANT
SUT STEEL 6MS V (SUTURE) ×3 IMPLANT
SUT STEEL SZ 6 DBL 3X14 BALL (SUTURE) ×6 IMPLANT
SUT VIC AB 1 CTX 18 (SUTURE) ×6 IMPLANT
SUT VIC AB 2-0 CT1 27 (SUTURE) ×1
SUT VIC AB 2-0 CT1 TAPERPNT 27 (SUTURE) ×2 IMPLANT
SUTURE E-PAK OPEN HEART (SUTURE) ×3 IMPLANT
SYSTEM SAHARA CHEST DRAIN ATS (WOUND CARE) ×3 IMPLANT
TAPE CLOTH SOFT 2X10 (GAUZE/BANDAGES/DRESSINGS) ×3 IMPLANT
TAPE CLOTH SURG 4X10 WHT LF (GAUZE/BANDAGES/DRESSINGS) ×3 IMPLANT
TOWEL GREEN STERILE (TOWEL DISPOSABLE) ×12 IMPLANT
TOWEL GREEN STERILE FF (TOWEL DISPOSABLE) ×6 IMPLANT
TOWEL OR 17X24 6PK STRL BLUE (TOWEL DISPOSABLE) ×6 IMPLANT
TOWEL OR 17X26 10 PK STRL BLUE (TOWEL DISPOSABLE) ×6 IMPLANT
TRAY CATH LUMEN 1 20CM STRL (SET/KITS/TRAYS/PACK) ×3 IMPLANT
TRAY FOLEY IC TEMP SENS 16FR (CATHETERS) ×3 IMPLANT
TUBING ART PRESS 48 MALE/FEM (TUBING) ×6 IMPLANT
TUBING INSUFFLATION (TUBING) ×3 IMPLANT
UNDERPAD 30X30 (UNDERPADS AND DIAPERS) ×3 IMPLANT
WATER STERILE IRR 1000ML POUR (IV SOLUTION) ×6 IMPLANT

## 2016-07-04 NOTE — Anesthesia Procedure Notes (Signed)
Procedure Name: Intubation Date/Time: 07/04/2016 7:59 AM Performed by: Ollen Bowl Pre-anesthesia Checklist: Patient identified, Emergency Drugs available, Suction available, Patient being monitored and Timeout performed Patient Re-evaluated:Patient Re-evaluated prior to inductionOxygen Delivery Method: Circle system utilized and Simple face mask Preoxygenation: Pre-oxygenation with 100% oxygen Intubation Type: IV induction Ventilation: Mask ventilation without difficulty Laryngoscope Size: Mac and 4 Grade View: Grade I Tube type: Subglottic suction tube Tube size: 8.0 mm Number of attempts: 1 Airway Equipment and Method: Patient positioned with wedge pillow and Stylet Placement Confirmation: ETT inserted through vocal cords under direct vision,  positive ETCO2 and breath sounds checked- equal and bilateral Secured at: 24 cm Tube secured with: Tape Dental Injury: Teeth and Oropharynx as per pre-operative assessment  Comments: Floppy epiglotis. Changed from miller 3 to [facility] with good view

## 2016-07-04 NOTE — OR Nursing (Signed)
SICU charge called at [phone] first call. Second call at [phone]

## 2016-07-04 NOTE — Transfer of Care (Signed)
Immediate Anesthesia Transfer of Care Note  Patient: Blanche Eastlan M Gueye  Procedure(s) Performed: Procedure(s): CORONARY ARTERY BYPASS GRAFT times five with left internal mammary artery to Left Anterior Descending artery : endoscopic harvest of right saphenous vein with grafts to Diagonal, Right Coronary, Obtuse Marginal 1, Obtuse Marginal 3 Arteries. Insertion of right groin femoral a line (N/A) INTRAOPERATIVE TRANSESOPHAGEAL ECHOCARDIOGRAM (N/A)  Patient Location: SICU  Anesthesia Type:General  Level of Consciousness: Patient remains intubated per anesthesia plan  Airway & Oxygen Therapy: Patient remains intubated per anesthesia plan and Patient placed on Ventilator (see vital sign flow sheet for setting)  Post-op Assessment: Report given to RN and Post -op Vital signs reviewed and stable  Post vital signs: Reviewed and stable  Last Vitals:  Vitals:   07/04/16 0500 07/04/16 1504  BP: 121/79 99/65  Pulse: 68 89  Resp: 14 12  Temp:      Last Pain:  Vitals:   07/04/16 0300  TempSrc:   PainSc: Asleep      Patients Stated Pain Goal: 1 (07/04/16 0100)  Complications: No apparent anesthesia complications

## 2016-07-04 NOTE — Progress Notes (Signed)
TCTS BRIEF SICU PROGRESS NOTE  Day of Surgery  S/P Procedure(s) (LRB): CORONARY ARTERY BYPASS GRAFT times five with left internal mammary artery to Left Anterior Descending artery : endoscopic harvest of right saphenous vein with grafts to Diagonal, Right Coronary, Obtuse Marginal 1, Obtuse Marginal 3 Arteries. Insertion of right groin femoral a line (N/A) INTRAOPERATIVE TRANSESOPHAGEAL ECHOCARDIOGRAM (N/A)   Had a diffuse rash earlier assoc w/ hypotension - this has resolved Starting to wake on vent AAI paced w/ stable hemodynamics O2 sats 100% Chest tube output low UOP adequate  Plan: Continue routine early postop  Purcell Nailslarence H Jovonne Wilton, MD 07/04/2016 4:46 PM

## 2016-07-04 NOTE — Anesthesia Procedure Notes (Signed)
Central Venous Catheter Insertion Performed by: Oleta Mouse, anesthesiologist Start/End3/12/2016 7:01 AM, 07/04/2016 7:10 AM Patient location: Pre-op. Preanesthetic checklist: patient identified, IV checked, site marked, risks and benefits discussed, surgical consent, monitors and equipment checked, pre-op evaluation, timeout performed and anesthesia consent Lidocaine 1% used for infiltration and patient sedated Hand hygiene performed  and maximum sterile barriers used  Catheter size: 9 Fr Total catheter length 10. MAC introducer Procedure performed using ultrasound guided technique. Ultrasound Notes:anatomy identified, needle tip was noted to be adjacent to the nerve/plexus identified, no ultrasound evidence of intravascular and/or intraneural injection and image(s) printed for medical record Attempts: 1 Following insertion, line sutured and dressing applied. Post procedure assessment: blood return through all ports, free fluid flow and no air  Patient tolerated the procedure well with no immediate complications.

## 2016-07-04 NOTE — Anesthesia Procedure Notes (Signed)
Arterial Line Insertion Start/End3/12/2016 7:18 AM, 07/04/2016 7:24 AM Performed by: Val EagleMOSER, Alif Petrak, anesthesiologist  Patient location: Pre-op. Preanesthetic checklist: patient identified, IV checked, site marked, risks and benefits discussed, surgical consent, monitors and equipment checked, pre-op evaluation, timeout performed and anesthesia consent Lidocaine 1% used for infiltration Left, radial was placed Catheter size: 20 Fr Hand hygiene performed  and maximum sterile barriers used   Attempts: 1 Procedure performed without using ultrasound guided technique. Following insertion, dressing applied and Biopatch. Post procedure assessment: normal and unchanged  Patient tolerated the procedure well with no immediate complications.

## 2016-07-04 NOTE — Progress Notes (Signed)
Patient having occasional times of apnea followed by oxygen desaturation while sleeping. On talking with wife she acknowledges patient snores at home and does not go to the doctor often. Advised her what the alarms were from and that she may hear them throughout the night if his oxygen goes down. Patient states he is normally well rested in the morning at home after sleeping.

## 2016-07-04 NOTE — Progress Notes (Signed)
Rapid wean protocol initiated by RT and this RN @ 1910. Will continue to monitor pt closely.  Herma ArdMOSELEY, Fard Borunda F, RN

## 2016-07-04 NOTE — Brief Op Note (Signed)
07/03/2016 - 07/04/2016  11:55 AM  PATIENT:  Joseph Kidd  66 y.o. male  PRE-OPERATIVE DIAGNOSIS:  1. S/p NSTEMI 2. Coronary artery disease  POST-OPERATIVE DIAGNOSIS:  1. S/p NSTEMI 2. Coronary artery disease  PROCEDURE: INTRAOPERATIVE TRANSESOPHAGEAL ECHOCARDIOGRAM, MEDIAN STERNOTOMY for CORONARY ARTERY BYPASS GRAFT x 5 (LIMA to LAD, SVG to DIAGONAL, SVG SEQUENTIALLY to OM1 and OM3, and SVG to RCA) with  endoscopic harvest of right greater saphenous vein with grafts to    SURGEON:  Surgeon(s) and Role:    * Delight OvensEdward B Gerhardt, MD - Primary  PHYSICIAN ASSISTANT: Doree Fudgeonielle Tieler Cournoyer PA-C  ANESTHESIA:   general  EBL:  Total I/O In: 1400 [I.V.:1400] Out: 200 [Urine:200]  DRAINS: Chest tubes placed in the mediastinal and pleural spaces   COUNTS CORRECT:  YES  DICTATION: .Dragon Dictation  PLAN OF CARE: Admit to inpatient   PATIENT DISPOSITION:  ICU - intubated and hemodynamically stable.   Delay start of Pharmacological VTE agent (>24hrs) due to surgical blood loss or risk of bleeding: yes  BASELINE WEIGHT: 115 kg

## 2016-07-04 NOTE — Anesthesia Preprocedure Evaluation (Addendum)
Anesthesia Evaluation  Patient identified by MRN, date of birth, ID band Patient awake    Reviewed: Allergy & Precautions, NPO status , Patient's Chart, lab work & pertinent test results, reviewed documented beta blocker date and time   History of Anesthesia Complications Negative for: history of anesthetic complications  Airway Mallampati: II  TM Distance: >3 FB Neck ROM: Full    Dental  (+) Missing, Dental Advisory Given   Pulmonary neg pulmonary ROS,    breath sounds clear to auscultation       Cardiovascular hypertension, + Past MI   Rhythm:Regular     Neuro/Psych negative neurological ROS  negative psych ROS   GI/Hepatic negative GI ROS, Neg liver ROS,   Endo/Other  Morbid obesity  Renal/GU negative Renal ROS     Musculoskeletal  (+) Arthritis ,   Abdominal   Peds  Hematology   Anesthesia Other Findings   Reproductive/Obstetrics                           Anesthesia Physical Anesthesia Plan  ASA: IV  Anesthesia Plan:    Post-op Pain Management:    Induction: Intravenous  Airway Management Planned: Oral ETT  Additional Equipment: Arterial line, TEE, CVP, PA Cath and Ultrasound Guidance Line Placement  Intra-op Plan:   Post-operative Plan: Post-operative intubation/ventilation  Informed Consent:   Dental advisory given  Plan Discussed with: CRNA and Surgeon  Anesthesia Plan Comments:         Anesthesia Quick Evaluation

## 2016-07-04 NOTE — Progress Notes (Signed)
Pre Procedure note for inpatients:   Joseph Kidd has been scheduled for Procedure(s): CORONARY ARTERY BYPASS GRAFTING (CABG) (N/A) INTRAOPERATIVE TRANSESOPHAGEAL ECHOCARDIOGRAM (N/A) today. The various methods of treatment have been discussed with the patient. After consideration of the risks, benefits and treatment options the patient has consented to the planned procedure.   The patient has been seen and labs reviewed. There are no changes in the patient's condition to prevent proceeding with the planned procedure today.  Recent labs:  Lab Results  Component Value Date   WBC 6.6 07/03/2016   HGB 13.3 07/03/2016   HCT 39.0 07/03/2016   PLT 206 07/03/2016   GLUCOSE 106 (H) 07/04/2016   CHOL 248 (H) 07/04/2016   CHOL 241 (H) 07/04/2016   TRIG 129 07/04/2016   TRIG 141 07/04/2016   HDL 23 (L) 07/04/2016   HDL 23 (L) 07/04/2016   LDLCALC 199 (H) 07/04/2016   LDLCALC 190 (H) 07/04/2016   ALT 41 07/04/2016   AST 110 (H) 07/04/2016   NA 138 07/04/2016   K 3.3 (L) 07/04/2016   CL 106 07/04/2016   CREATININE 0.88 07/04/2016   BUN 16 07/04/2016   CO2 25 07/04/2016   INR 1.09 07/04/2016   HGBA1C 5.5 07/03/2016    Delight OvensEdward B Solon Alban, MD 07/04/2016 7:12 AM

## 2016-07-04 NOTE — Anesthesia Procedure Notes (Signed)
Central Venous Catheter Insertion Performed by: Val EagleMOSER, Delma Villalva, anesthesiologist Start/End3/12/2016 7:01 AM, 07/04/2016 7:10 AM Patient location: Pre-op. Preanesthetic checklist: patient identified, IV checked, site marked, risks and benefits discussed, surgical consent, monitors and equipment checked, pre-op evaluation, timeout performed and anesthesia consent Hand hygiene performed  and maximum sterile barriers used  PA cath was placed.Swan type:thermodilution Procedure performed without using ultrasound guided technique. Attempts: 1 Patient tolerated the procedure well with no immediate complications.

## 2016-07-04 NOTE — Progress Notes (Signed)
Rapid Wean protocol started at this time   

## 2016-07-04 NOTE — Progress Notes (Signed)
Pt flipped over to CPAP/PS at this time by RT. Will continue to monitor closely.  Herma ArdMOSELEY, Oney Folz F, RN

## 2016-07-04 NOTE — Procedures (Signed)
Extubation Procedure Note  Patient Details:   Name: Joseph Kidd DOB: 09/22/1950 MRN: [id number]   Airway Documentation:     Evaluation  O2 sats: stable throughout Complications: No apparent complications Patient did tolerate procedure well. Bilateral Breath Sounds: Clear   Yes, pt able to cough to clear secretions and able to hoarsely vocalize name and DOB. RT placed pt on 4L humidified [place] and pt is tolerating well at this time. Pt positive for cuff leak before extubation, with a NIF of -40 and VC of 8-1.8L.   Tacy Learnurriff, Julane Crock E 07/04/2016, 8:18 PM

## 2016-07-05 ENCOUNTER — Inpatient Hospital Stay (HOSPITAL_COMMUNITY): Payer: Medicare Other

## 2016-07-05 DIAGNOSIS — Z951 Presence of aortocoronary bypass graft: Secondary | ICD-10-CM

## 2016-07-05 LAB — POCT I-STAT, CHEM 8
BUN: 25 mg/dL — ABNORMAL HIGH (ref 6–20)
CHLORIDE: 107 mmol/L (ref 101–111)
CREATININE: 0.9 mg/dL (ref 0.61–1.24)
Calcium, Ion: 1.16 mmol/L (ref 1.15–1.40)
GLUCOSE: 126 mg/dL — AB (ref 65–99)
HCT: 27 % — ABNORMAL LOW (ref 39.0–52.0)
Hemoglobin: 9.2 g/dL — ABNORMAL LOW (ref 13.0–17.0)
POTASSIUM: 4.1 mmol/L (ref 3.5–5.1)
Sodium: 139 mmol/L (ref 135–145)
TCO2: 23 mmol/L (ref 0–100)

## 2016-07-05 LAB — GLUCOSE, CAPILLARY
GLUCOSE-CAPILLARY: 100 mg/dL — AB (ref 65–99)
GLUCOSE-CAPILLARY: 107 mg/dL — AB (ref 65–99)
GLUCOSE-CAPILLARY: 119 mg/dL — AB (ref 65–99)
GLUCOSE-CAPILLARY: 91 mg/dL (ref 65–99)
GLUCOSE-CAPILLARY: 97 mg/dL (ref 65–99)
Glucose-Capillary: 111 mg/dL — ABNORMAL HIGH (ref 65–99)
Glucose-Capillary: 108 mg/dL — ABNORMAL HIGH (ref 65–99)
Glucose-Capillary: 97 mg/dL (ref 65–99)
Glucose-Capillary: 101 mg/dL — ABNORMAL HIGH (ref 65–99)
Glucose-Capillary: 101 mg/dL — ABNORMAL HIGH (ref 65–99)
Glucose-Capillary: 99 mg/dL (ref 65–99)
Glucose-Capillary: 116 mg/dL — ABNORMAL HIGH (ref 65–99)
Glucose-Capillary: 110 mg/dL — ABNORMAL HIGH (ref 65–99)

## 2016-07-05 LAB — CBC
HCT: 22.2 % — ABNORMAL LOW (ref 39.0–52.0)
HCT: 26.7 % — ABNORMAL LOW (ref 39.0–52.0)
Hemoglobin: 7.3 g/dL — ABNORMAL LOW (ref 13.0–17.0)
Hemoglobin: 8.9 g/dL — ABNORMAL LOW (ref 13.0–17.0)
MCH: 28.5 pg (ref 26.0–34.0)
MCH: 29.3 pg (ref 26.0–34.0)
MCHC: 32.9 g/dL (ref 30.0–36.0)
MCHC: 33.3 g/dL (ref 30.0–36.0)
MCV: 86.7 fL (ref 78.0–100.0)
MCV: 87.8 fL (ref 78.0–100.0)
PLATELETS: 129 10*3/uL — AB (ref 150–400)
Platelets: 133 10*3/uL — ABNORMAL LOW (ref 150–400)
RBC: 2.56 MIL/uL — ABNORMAL LOW (ref 4.22–5.81)
RBC: 3.04 MIL/uL — AB (ref 4.22–5.81)
RDW: 14.3 % (ref 11.5–15.5)
RDW: 14.7 % (ref 11.5–15.5)
WBC: 10 10*3/uL (ref 4.0–10.5)
WBC: 13.6 10*3/uL — ABNORMAL HIGH (ref 4.0–10.5)

## 2016-07-05 LAB — BASIC METABOLIC PANEL
ANION GAP: 7 (ref 5–15)
BUN: 19 mg/dL (ref 6–20)
CO2: 22 mmol/L (ref 22–32)
Calcium: 7.5 mg/dL — ABNORMAL LOW (ref 8.9–10.3)
Chloride: 108 mmol/L (ref 101–111)
Creatinine, Ser: 0.83 mg/dL (ref 0.61–1.24)
GFR calc Af Amer: >60 mL/min (ref >60)
GFR calc non Af Amer: >60 mL/min (ref >60)
Glucose, Bld: 99 mg/dL (ref 65–99)
POTASSIUM: 4 mmol/L (ref 3.5–5.1)
SODIUM: 137 mmol/L (ref 135–145)

## 2016-07-05 LAB — MAGNESIUM
MAGNESIUM: 2.4 mg/dL (ref 1.7–2.4)
Magnesium: 2.5 mg/dL — ABNORMAL HIGH (ref 1.7–2.4)

## 2016-07-05 LAB — PREPARE RBC (CROSSMATCH)

## 2016-07-05 LAB — CREATININE, SERUM: Creatinine, Ser: 1.02 mg/dL (ref 0.61–1.24)

## 2016-07-05 MED ORDER — INSULIN ASPART 100 UNIT/ML ~~LOC~~ SOLN: 0.0000 [IU] | SUBCUTANEOUS | Status: DC

## 2016-07-05 MED ORDER — SODIUM CHLORIDE 0.9 % IV SOLN
Freq: Once | INTRAVENOUS | Status: AC
  Administered 2016-07-05: 10:00:00 via INTRAVENOUS

## 2016-07-05 MED ORDER — KETOROLAC TROMETHAMINE 15 MG/ML IJ SOLN
15.0000 mg | Freq: Once | INTRAMUSCULAR | Status: AC
  Administered 2016-07-05: 15 mg via INTRAVENOUS

## 2016-07-05 MED ORDER — SODIUM CHLORIDE 0.9 % IV SOLN
0.0000 ug/min | INTRAVENOUS | Status: DC
  Administered 2016-07-05: 60 ug/min via INTRAVENOUS
  Administered 2016-07-05: 50 ug/min via INTRAVENOUS
  Administered 2016-07-06: 45 ug/min via INTRAVENOUS
  Filled 2016-07-05 (×3): qty 4

## 2016-07-05 MED ORDER — KETOROLAC TROMETHAMINE 15 MG/ML IJ SOLN
INTRAMUSCULAR | Status: AC
  Filled 2016-07-05: qty 1

## 2016-07-05 MED ORDER — INSULIN DETEMIR 100 UNIT/ML ~~LOC~~ SOLN
20.0000 [IU] | Freq: Once | SUBCUTANEOUS | Status: AC
  Administered 2016-07-05: 20 [IU] via SUBCUTANEOUS
  Filled 2016-07-05: qty 0.2

## 2016-07-05 MED ORDER — FUROSEMIDE 10 MG/ML IJ SOLN: 20.0000 mg | Freq: Once | INTRAMUSCULAR | Status: DC

## 2016-07-05 MED ORDER — FUROSEMIDE 10 MG/ML IJ SOLN
20.0000 mg | Freq: Once | INTRAMUSCULAR | Status: AC
  Administered 2016-07-05: 20 mg via INTRAVENOUS
  Filled 2016-07-05: qty 2

## 2016-07-05 NOTE — Progress Notes (Signed)
TCTS BRIEF SICU PROGRESS NOTE  1 Day Post-Op  S/P Procedure(s) (LRB): CORONARY ARTERY BYPASS GRAFT times five with left internal mammary artery to Left Anterior Descending artery : endoscopic harvest of right saphenous vein with grafts to Diagonal, Right Coronary, Obtuse Marginal 1, Obtuse Marginal 3 Arteries. Insertion of right groin femoral a line (N/A) INTRAOPERATIVE TRANSESOPHAGEAL ECHOCARDIOGRAM (N/A)   Stable day NSR - AAI paced although still on Neo for BP support Breathing comfortably w/ O2 sats 91-97% on 4 L/min UOP adequate  Plan: Continue current plans  Purcell Nailslarence H Consepcion Utt, MD 07/05/2016 5:53 PM

## 2016-07-05 NOTE — Progress Notes (Signed)
[address]       [place] [id number]             [phone]        CARDIOTHORACIC SURGERY PROGRESS NOTE   R1 Day Post-Op Procedure(s) (LRB): CORONARY ARTERY BYPASS GRAFT times five with left internal mammary artery to Left Anterior Descending artery : endoscopic harvest of right saphenous vein with grafts to Diagonal, Right Coronary, Obtuse Marginal 1, Obtuse Marginal 3 Arteries. Insertion of right groin femoral a line (N/A) INTRAOPERATIVE TRANSESOPHAGEAL ECHOCARDIOGRAM (N/A)  Subjective: Looks good and feels well.  Mild soreness in chest.  Objective: Vital signs: BP Readings from Last 1 Encounters:  07/05/16 (!) 88/60   Pulse Readings from Last 1 Encounters:  07/05/16 90   Resp Readings from Last 1 Encounters:  07/05/16 (!) 27   Temp Readings from Last 1 Encounters:  07/05/16 99.3 F (37.4 C)    Hemodynamics: PAP: (25-46)/(12-28) 36/22 CO:  [4.4 L/min-7.4 L/min] 5 L/min CI:  [1.9 L/min/m2-3.2 L/min/m2] 2.2 L/min/m2  Physical Exam:  Rhythm:   Sinus - AAI paced  Breath sounds: clear  Heart sounds:  RRR  Incisions:  Dressings dry, intact  Abdomen:  Soft, non-distended, non-tender  Extremities:  Warm, well-perfused  Chest tubes:  low volume thin serosanguinous output, no air leak    Intake/Output from previous day: 03/09 0701 - 03/10 0700 In: 6911.1 [P.O.:120; I.V.:4088.1; Blood:488; IV Piggyback:2215] Out: 3675 [Urine:1355; Emesis/NG output:50; Blood:1900; Chest Tube:370] Intake/Output this shift: Total I/O In: -  Out: 145 [Urine:125; Chest Tube:20]  Lab Results:  CBC: Recent Labs  07/04/16 2053 07/04/16 2059 07/05/16 0412  WBC 11.6*  --  10.0  HGB 8.7* 7.8* 7.3*  HCT 25.9* 23.0* 22.2*  PLT 128*  --  129*    BMET:  Recent Labs  07/04/16 0436  07/04/16 2059 07/05/16 0412  NA 138  < > 139 137  K 3.3*  < > 4.2 4.0  CL 106  < > 109 108  CO2 25  --   --  22  GLUCOSE 106*  < > 136* 99  BUN 16  < > 19 19  CREATININE  0.88  < > 0.80 0.83  CALCIUM 8.7*  --   --  7.5*  < > = values in this interval not displayed.   PT/INR:   Recent Labs  07/04/16 1459  LABPROT 20.8*  INR 1.77    CBG (last 3)   Recent Labs  07/05/16 0558 07/05/16 0746 07/05/16 0857  GLUCAP 97 101* 101*    ABG    Component Value Date/Time   PHART 7.382 07/04/2016 2105   PCO2ART 34.0 07/04/2016 2105   PO2ART 79.0 (L) 07/04/2016 2105   HCO3 20.1 07/04/2016 2105   TCO2 21 07/04/2016 2105   ACIDBASEDEF 4.0 (H) 07/04/2016 2105   O2SAT 95.0 07/04/2016 2105    CXR: PORTABLE CHEST 1 VIEW  COMPARISON:  Chest x-rays dated 07/04/2016 and 07/03/2016.  FINDINGS: Endotracheal tube and enteric tube have been removed. Swan-Ganz catheter is stable in position with tip to the left of midline. Left-sided chest tube is stable in position.  Heart size and mediastinal contours appear stable. Mild central pulmonary vascular congestion. Probable atelectasis and small pleural effusion at the left lung base. No pneumothorax.  IMPRESSION: 1. Mild central pulmonary vascular congestion without overt alveolar pulmonary edema. Probable mild atelectasis and small effusion at the left lung base. 2. Interval removal of the endotracheal and enteric tubes. Remainder  of the support apparatus remains appropriately positioned.   Electronically Signed   By: Bary Richard M.D.   On: 07/05/2016 09:09  EKG: NSR w/out acute ischemic changes     Assessment/Plan: S/P Procedure(s) (LRB): CORONARY ARTERY BYPASS GRAFT times five with left internal mammary artery to Left Anterior Descending artery : endoscopic harvest of right saphenous vein with grafts to Diagonal, Right Coronary, Obtuse Marginal 1, Obtuse Marginal 3 Arteries. Insertion of right groin femoral a line (N/A) INTRAOPERATIVE TRANSESOPHAGEAL ECHOCARDIOGRAM (N/A)  Overall doing well POD1 Maintaining NSR - AAI paced rhythm w/ stable hemodynamics on Neo drip for BP support Breathing  comfortably w/ O2 sats  96-98% on 4 L/min Expected post op acute blood loss anemia, Hgb down 7.3 this morning Expected post op volume excess, weight reportedly 18 lbs > preop Expected post op atelectasis, mild Post op thrombocytopenia, mild   Will transfuse PRBC's for acute blood loss anemia w/ continued marginal BP on Neo drip  Mobilize  Diuresis  D/C lines  D/C tubes    Purcell Nails, MD 07/05/2016 9:26 AM

## 2016-07-06 ENCOUNTER — Inpatient Hospital Stay (HOSPITAL_COMMUNITY): Payer: Medicare Other

## 2016-07-06 LAB — BPAM RBC
Blood Product Expiration Date: 201804052359
Blood Product Expiration Date: 201804052359
ISSUE DATE / TIME: 201803101042
ISSUE DATE / TIME: 201803101253
Unit Type and Rh: 5100
Unit Type and Rh: 5100

## 2016-07-06 LAB — GLUCOSE, CAPILLARY
GLUCOSE-CAPILLARY: 115 mg/dL — AB (ref 65–99)
GLUCOSE-CAPILLARY: 114 mg/dL — AB (ref 65–99)
GLUCOSE-CAPILLARY: 111 mg/dL — AB (ref 65–99)
Glucose-Capillary: 104 mg/dL — ABNORMAL HIGH (ref 65–99)
Glucose-Capillary: 111 mg/dL — ABNORMAL HIGH (ref 65–99)
Glucose-Capillary: 79 mg/dL (ref 65–99)

## 2016-07-06 LAB — TYPE AND SCREEN
ABO/RH(D): O POS
Antibody Screen: NEGATIVE
Unit division: 0
Unit division: 0

## 2016-07-06 LAB — BASIC METABOLIC PANEL
ANION GAP: 4 — AB (ref 5–15)
BUN: 30 mg/dL — AB (ref 6–20)
CALCIUM: 7.6 mg/dL — AB (ref 8.9–10.3)
CO2: 24 mmol/L (ref 22–32)
Chloride: 109 mmol/L (ref 101–111)
Creatinine, Ser: 0.92 mg/dL (ref 0.61–1.24)
GFR calc Af Amer: >60 mL/min (ref >60)
Glucose, Bld: 119 mg/dL — ABNORMAL HIGH (ref 65–99)
Potassium: 4.2 mmol/L (ref 3.5–5.1)
SODIUM: 137 mmol/L (ref 135–145)

## 2016-07-06 LAB — CBC
HCT: 24.4 % — ABNORMAL LOW (ref 39.0–52.0)
Hemoglobin: 8.1 g/dL — ABNORMAL LOW (ref 13.0–17.0)
MCH: 29.3 pg (ref 26.0–34.0)
MCHC: 33.2 g/dL (ref 30.0–36.0)
MCV: 88.4 fL (ref 78.0–100.0)
Platelets: 119 10*3/uL — ABNORMAL LOW (ref 150–400)
RBC: 2.76 MIL/uL — ABNORMAL LOW (ref 4.22–5.81)
RDW: 15 % (ref 11.5–15.5)
WBC: 11.9 10*3/uL — AB (ref 4.0–10.5)

## 2016-07-06 MED ORDER — FUROSEMIDE 10 MG/ML IJ SOLN
20.0000 mg | Freq: Once | INTRAMUSCULAR | Status: AC
  Administered 2016-07-06: 20 mg via INTRAVENOUS
  Filled 2016-07-06: qty 2

## 2016-07-06 MED ORDER — AMIODARONE LOAD VIA INFUSION
150.0000 mg | Freq: Once | INTRAVENOUS | Status: AC
  Administered 2016-07-06: 150 mg via INTRAVENOUS
  Filled 2016-07-06: qty 83.34

## 2016-07-06 MED ORDER — AMIODARONE HCL IN DEXTROSE 360-4.14 MG/200ML-% IV SOLN
30.0000 mg/h | INTRAVENOUS | Status: DC
  Administered 2016-07-06: 30 mg/h via INTRAVENOUS
  Filled 2016-07-06: qty 200

## 2016-07-06 MED ORDER — ENOXAPARIN SODIUM 40 MG/0.4ML ~~LOC~~ SOLN
40.0000 mg | SUBCUTANEOUS | Status: DC
  Administered 2016-07-07 – 2016-07-10 (×4): 40 mg via SUBCUTANEOUS
  Filled 2016-07-06 (×4): qty 0.4

## 2016-07-06 MED ORDER — INSULIN ASPART 100 UNIT/ML ~~LOC~~ SOLN: 0.0000 [IU] | Freq: Three times a day (TID) | SUBCUTANEOUS | Status: DC

## 2016-07-06 MED ORDER — AMIODARONE HCL IN DEXTROSE 360-4.14 MG/200ML-% IV SOLN
INTRAVENOUS | Status: AC
  Filled 2016-07-06: qty 200

## 2016-07-06 MED ORDER — AMIODARONE HCL IN DEXTROSE 360-4.14 MG/200ML-% IV SOLN
60.0000 mg/h | INTRAVENOUS | Status: DC
  Administered 2016-07-06 (×2): 60 mg/h via INTRAVENOUS
  Filled 2016-07-06: qty 200

## 2016-07-06 NOTE — Plan of Care (Signed)
Problem: Bowel/Gastric: Goal: Will not experience complications related to bowel motility Outcome: Progressing Pt with intermittent nausea and bloating today, passing gas, postop bowel regimen in place  Problem: Activity: Goal: Risk for activity intolerance will decrease Outcome: Progressing Pt is using heart pillow, out of bed to chair and one walk today, pt has weakness of Rt leg and SOB with activity  Problem: Cardiac: Goal: Hemodynamic stability will improve Outcome: Progressing Pt weaned off Neo gtt today  Problem: Physical Regulation: Goal: Postoperative complications will be avoided or minimized Outcome: Progressing Pt with afib/RVR today but converted back to regular sinus rhythm  Problem: Respiratory: Goal: Levels of oxygenation will improve Outcome: Progressing Oxygen needs weaned down as tolerated Goal: Respiratory status will improve Outcome: Progressing Good effort on CDB and IS

## 2016-07-06 NOTE — Progress Notes (Addendum)
[address]       [place] [id number]             [phone]        CARDIOTHORACIC SURGERY PROGRESS NOTE   R2 Days Post-Op Procedure(s) (LRB): CORONARY ARTERY BYPASS GRAFT times five with left internal mammary artery to Left Anterior Descending artery : endoscopic harvest of right saphenous vein with grafts to Diagonal, Right Coronary, Obtuse Marginal 1, Obtuse Marginal 3 Arteries. Insertion of right groin femoral a line (N/A) INTRAOPERATIVE TRANSESOPHAGEAL ECHOCARDIOGRAM (N/A)  Subjective: Went into rapid Afib earlier this morning.  Feels "washed out" this morning.  Mild soreness in chest.  Denies SOB, dizziness  Objective: Vital signs: BP Readings from Last 1 Encounters:  07/06/16 100/67   Pulse Readings from Last 1 Encounters:  07/06/16 90   Resp Readings from Last 1 Encounters:  07/06/16 17   Temp Readings from Last 1 Encounters:  07/06/16 97.8 F (36.6 C) (Oral)    Hemodynamics:    Physical Exam:  Rhythm:   Afib  Breath sounds: clear  Heart sounds:  irregular  Incisions:  Dressings dry, intact  Abdomen:  Soft, non-distended, non-tender  Extremities:  Warm, well-perfused  Chest tubes:  Decreasing but still significant volume thin serosanguinous output, no air leak    Intake/Output from previous day: 03/10 0701 - 03/11 0700 In: 2663.5 [P.O.:410; I.V.:1056.2; Blood:1147.3; IV Piggyback:50] Out: 1790 [Urine:1120; Chest Tube:670] Intake/Output this shift: Total I/O In: -  Out: 220 [Urine:190; Chest Tube:30]  Lab Results:  CBC: Recent Labs  07/05/16 1650 07/05/16 1659 07/06/16 0343  WBC 13.6*  --  11.9*  HGB 8.9* 9.2* 8.1*  HCT 26.7* 27.0* 24.4*  PLT 133*  --  119*    BMET:  Recent Labs  07/05/16 0412  07/05/16 1659 07/06/16 0343  NA 137  --  139 137  K 4.0  --  4.1 4.2  CL 108  --  107 109  CO2 22  --   --  24  GLUCOSE 99  --  126* 119*  BUN 19  --  25* 30*  CREATININE 0.83  < > 0.90 0.92  CALCIUM 7.5*  --   --   7.6*  < > = values in this interval not displayed.   PT/INR:   Recent Labs  07/04/16 1459  LABPROT 20.8*  INR 1.77    CBG (last 3)   Recent Labs  07/05/16 2318 07/06/16 0310 07/06/16 0849  GLUCAP 119* 104* 115*    ABG    Component Value Date/Time   PHART 7.382 07/04/2016 2105   PCO2ART 34.0 07/04/2016 2105   PO2ART 79.0 (L) 07/04/2016 2105   HCO3 20.1 07/04/2016 2105   TCO2 23 07/05/2016 1659   ACIDBASEDEF 4.0 (H) 07/04/2016 2105   O2SAT 95.0 07/04/2016 2105    CXR: PORTABLE CHEST 1 VIEW  COMPARISON:  07/05/2016  FINDINGS: Swan-Ganz catheter has been removed in the interval. Postsurgical changes are again seen. Left-sided thoracostomy catheter remains in place without pneumothorax. Left basilar atelectasis is noted. The right lung is clear.  IMPRESSION: Left basilar atelectasis.  No pneumothorax noted.   Electronically Signed   By: Alcide CleverMark  Lukens M.D.   On: 07/06/2016 08:24  Assessment/Plan: S/P Procedure(s) (LRB): CORONARY ARTERY BYPASS GRAFT times five with left internal mammary artery to Left Anterior Descending artery : endoscopic harvest of right saphenous vein with grafts to Diagonal, Right Coronary, Obtuse Marginal 1, Obtuse Marginal 3 Arteries. Insertion of right  groin femoral a line (N/A) INTRAOPERATIVE TRANSESOPHAGEAL ECHOCARDIOGRAM (N/A)  Overall reasonably stable POD2 Now in Afib w/ stable BP but still on Neo drip for BP support Breathing comfortably w/ O2 sats 95-97% on 5 L/min via [place] Expected post op acute blood loss anemia, Hgb up to 8.1 this morning after transfusion yesterday Expected post op volume excess, weight up further, reportedly 22 lbs > preop Expected post op atelectasis, mild Post op thrombocytopenia, mild   IV amiodarone per protocol  Wean Neo as tolerated  Hold lasix until BP improved  Mobilize  Leave chest tubes in until output decreased  Lovenox for DVT prophylaxis  Purcell Nails, MD 07/06/2016 10:30  AM

## 2016-07-06 NOTE — Progress Notes (Signed)
[facility] BRIEF SICU PROGRESS NOTE  2 Days Post-Op  S/P Procedure(s) (LRB): CORONARY ARTERY BYPASS GRAFT times five with left internal mammary artery to Left Anterior Descending artery : endoscopic harvest of right saphenous vein with grafts to Diagonal, Right Coronary, Obtuse Marginal 1, Obtuse Marginal 3 Arteries. Insertion of right groin femoral a line (N/A) INTRAOPERATIVE TRANSESOPHAGEAL ECHOCARDIOGRAM (N/A)   Back in NSR and BP stable off Neo drip O2 sats 96% on 3 L/min Chest tube output decreased  Plan: Will d/c chest tubes.  Mobilize.  Diuresis  Purcell Nailslarence H Owen, MD 07/06/2016 5:34 PM

## 2016-07-06 NOTE — Progress Notes (Signed)
  Amiodarone Drug - Drug Interaction Consult Note  Recommendations: Continue to monitor HR carefully while patient is taking both amiodarone and B-blocker  Appropriate holding parameters are in place.   Amiodarone is metabolized by the cytochrome P450 system and therefore has the potential to cause many drug interactions. Amiodarone has an average plasma half-life of 50 days (range 20 to 100 days).   There is potential for drug interactions to occur several weeks or months after stopping treatment and the onset of drug interactions may be slow after initiating amiodarone.   []  Statins: Increased risk of myopathy. Simvastatin- restrict dose to 20mg  daily. Other statins: counsel patients to report any muscle pain or weakness immediately.  []  Anticoagulants: Amiodarone can increase anticoagulant effect. Consider warfarin dose reduction. Patients should be monitored closely and the dose of anticoagulant altered accordingly, remembering that amiodarone levels take several weeks to stabilize.  []  Antiepileptics: Amiodarone can increase plasma concentration of phenytoin, the dose should be reduced. Note that small changes in phenytoin dose can result in large changes in levels. Monitor patient and counsel on signs of toxicity.  [x]  Beta blockers: increased risk of bradycardia, AV block and myocardial depression. Sotalol - avoid concomitant use.  []   Calcium channel blockers (diltiazem and verapamil): increased risk of bradycardia, AV block and myocardial depression.  []   Cyclosporine: Amiodarone increases levels of cyclosporine. Reduced dose of cyclosporine is recommended.  []  Digoxin dose should be halved when amiodarone is started.  []  Diuretics: increased risk of cardiotoxicity if hypokalemia occurs.  []  Oral hypoglycemic agents (glyburide, glipizide, glimepiride): increased risk of hypoglycemia. Patient's glucose levels should be monitored closely when initiating amiodarone therapy.    []  Drugs that prolong the QT interval:  Torsades de pointes risk may be increased with concurrent use - avoid if possible.  Monitor QTc, also keep magnesium/potassium WNL if concurrent therapy can't be avoided. Marland Kitchen. Antibiotics: e.g. fluoroquinolones, erythromycin. . Antiarrhythmics: e.g. quinidine, procainamide, disopyramide, sotalol. . Antipsychotics: e.g. phenothiazines, haloperidol.  . Lithium, tricyclic antidepressants, and methadone. Thank You,  Hillis RangeEmily A Stewart  , PharmD 07/06/2016 8:16 AM

## 2016-07-07 ENCOUNTER — Encounter (HOSPITAL_COMMUNITY): Payer: Self-pay | Admitting: Cardiothoracic Surgery

## 2016-07-07 ENCOUNTER — Inpatient Hospital Stay (HOSPITAL_COMMUNITY): Payer: Medicare Other

## 2016-07-07 LAB — GLUCOSE, CAPILLARY
GLUCOSE-CAPILLARY: 110 mg/dL — AB (ref 65–99)
Glucose-Capillary: 117 mg/dL — ABNORMAL HIGH (ref 65–99)
Glucose-Capillary: 111 mg/dL — ABNORMAL HIGH (ref 65–99)
Glucose-Capillary: 105 mg/dL — ABNORMAL HIGH (ref 65–99)

## 2016-07-07 LAB — COMPREHENSIVE METABOLIC PANEL
ALT: 28 U/L (ref 17–63)
ANION GAP: 4 — AB (ref 5–15)
AST: 49 U/L — ABNORMAL HIGH (ref 15–41)
Albumin: 2.6 g/dL — ABNORMAL LOW (ref 3.5–5.0)
Alkaline Phosphatase: 38 U/L (ref 38–126)
BUN: 30 mg/dL — ABNORMAL HIGH (ref 6–20)
CHLORIDE: 105 mmol/L (ref 101–111)
CO2: 26 mmol/L (ref 22–32)
Calcium: 7.6 mg/dL — ABNORMAL LOW (ref 8.9–10.3)
Creatinine, Ser: 0.86 mg/dL (ref 0.61–1.24)
GFR calc non Af Amer: >60 mL/min (ref >60)
Glucose, Bld: 115 mg/dL — ABNORMAL HIGH (ref 65–99)
POTASSIUM: 3.9 mmol/L (ref 3.5–5.1)
SODIUM: 135 mmol/L (ref 135–145)
Total Bilirubin: 0.5 mg/dL (ref 0.3–1.2)
Total Protein: 4.9 g/dL — ABNORMAL LOW (ref 6.5–8.1)

## 2016-07-07 LAB — CBC
HCT: 22.4 % — ABNORMAL LOW (ref 39.0–52.0)
Hemoglobin: 7.4 g/dL — ABNORMAL LOW (ref 13.0–17.0)
MCH: 29.2 pg (ref 26.0–34.0)
MCHC: 33 g/dL (ref 30.0–36.0)
MCV: 88.5 fL (ref 78.0–100.0)
PLATELETS: 104 10*3/uL — AB (ref 150–400)
RBC: 2.53 MIL/uL — AB (ref 4.22–5.81)
RDW: 15.1 % (ref 11.5–15.5)
WBC: 7.8 10*3/uL (ref 4.0–10.5)

## 2016-07-07 MED ORDER — INSULIN ASPART 100 UNIT/ML ~~LOC~~ SOLN: 0.0000 [IU] | Freq: Three times a day (TID) | SUBCUTANEOUS | Status: DC

## 2016-07-07 MED ORDER — GUAIFENESIN ER 600 MG PO TB12: 600.0000 mg | ORAL_TABLET | Freq: Two times a day (BID) | ORAL | Status: DC | PRN

## 2016-07-07 MED ORDER — BISACODYL 5 MG PO TBEC
10.0000 mg | DELAYED_RELEASE_TABLET | Freq: Every day | ORAL | Status: DC | PRN
  Administered 2016-07-07: 10 mg via ORAL
  Filled 2016-07-07: qty 2

## 2016-07-07 MED ORDER — FUROSEMIDE 40 MG PO TABS
40.0000 mg | ORAL_TABLET | Freq: Every day | ORAL | Status: DC
  Administered 2016-07-07 – 2016-07-08 (×2): 40 mg via ORAL
  Filled 2016-07-07 (×2): qty 1

## 2016-07-07 MED ORDER — PANTOPRAZOLE SODIUM 40 MG PO TBEC
40.0000 mg | DELAYED_RELEASE_TABLET | Freq: Every day | ORAL | Status: DC
  Administered 2016-07-07 – 2016-07-10 (×4): 40 mg via ORAL
  Filled 2016-07-07 (×4): qty 1

## 2016-07-07 MED ORDER — BISACODYL 10 MG RE SUPP: 10.0000 mg | Freq: Every day | RECTAL | Status: DC | PRN

## 2016-07-07 MED ORDER — AMIODARONE HCL IN DEXTROSE 360-4.14 MG/200ML-% IV SOLN
60.0000 mg/h | INTRAVENOUS | Status: DC
  Filled 2016-07-07: qty 200

## 2016-07-07 MED ORDER — DOCUSATE SODIUM 100 MG PO CAPS
200.0000 mg | ORAL_CAPSULE | Freq: Every day | ORAL | Status: DC
  Administered 2016-07-07 – 2016-07-10 (×2): 200 mg via ORAL
  Filled 2016-07-07 (×3): qty 2

## 2016-07-07 MED ORDER — TRAMADOL HCL 50 MG PO TABS: 50.0000 mg | ORAL_TABLET | ORAL | Status: DC | PRN

## 2016-07-07 MED ORDER — AMIODARONE HCL 200 MG PO TABS
400.0000 mg | ORAL_TABLET | Freq: Two times a day (BID) | ORAL | Status: DC
  Administered 2016-07-07: 400 mg via ORAL
  Filled 2016-07-07: qty 2

## 2016-07-07 MED ORDER — SODIUM CHLORIDE 0.9 % IV SOLN: 250.0000 mL | INTRAVENOUS | Status: DC | PRN

## 2016-07-07 MED ORDER — MOVING RIGHT ALONG BOOK
Freq: Once | Status: AC
  Administered 2016-07-07: 08:00:00
  Filled 2016-07-07: qty 1

## 2016-07-07 MED ORDER — AMIODARONE HCL IN DEXTROSE 360-4.14 MG/200ML-% IV SOLN: 30.0000 mg/h | INTRAVENOUS | Status: DC

## 2016-07-07 MED ORDER — AMIODARONE HCL IN DEXTROSE 360-4.14 MG/200ML-% IV SOLN
30.0000 mg/h | INTRAVENOUS | Status: DC
  Administered 2016-07-07 – 2016-07-08 (×2): 30 mg/h via INTRAVENOUS
  Filled 2016-07-07 (×2): qty 200

## 2016-07-07 MED ORDER — ONDANSETRON HCL 4 MG/2ML IJ SOLN
4.0000 mg | Freq: Four times a day (QID) | INTRAMUSCULAR | Status: DC | PRN
  Administered 2016-07-07 (×2): 4 mg via INTRAVENOUS
  Filled 2016-07-07 (×2): qty 2

## 2016-07-07 MED ORDER — ONDANSETRON HCL 4 MG PO TABS: 4.0000 mg | ORAL_TABLET | Freq: Four times a day (QID) | ORAL | Status: DC | PRN

## 2016-07-07 MED ORDER — METOPROLOL TARTRATE 12.5 MG HALF TABLET
12.5000 mg | ORAL_TABLET | Freq: Two times a day (BID) | ORAL | Status: DC
Start: 2016-07-07 — End: 2016-07-10
  Administered 2016-07-07 – 2016-07-10 (×7): 12.5 mg via ORAL
  Filled 2016-07-07 (×7): qty 1

## 2016-07-07 MED ORDER — FERROUS SULFATE 325 (65 FE) MG PO TABS
325.0000 mg | ORAL_TABLET | Freq: Two times a day (BID) | ORAL | Status: DC
  Administered 2016-07-07 – 2016-07-10 (×6): 325 mg via ORAL
  Filled 2016-07-07 (×6): qty 1

## 2016-07-07 MED ORDER — POTASSIUM CHLORIDE CRYS ER 20 MEQ PO TBCR
20.0000 meq | EXTENDED_RELEASE_TABLET | Freq: Every day | ORAL | Status: DC
  Administered 2016-07-07 – 2016-07-10 (×4): 20 meq via ORAL
  Filled 2016-07-07 (×5): qty 1

## 2016-07-07 MED ORDER — FOLIC ACID 1 MG PO TABS
1.0000 mg | ORAL_TABLET | Freq: Every day | ORAL | Status: DC
  Administered 2016-07-07 – 2016-07-10 (×4): 1 mg via ORAL
  Filled 2016-07-07 (×4): qty 1

## 2016-07-07 MED ORDER — SODIUM CHLORIDE 0.9% FLUSH
3.0000 mL | Freq: Two times a day (BID) | INTRAVENOUS | Status: DC
  Administered 2016-07-07 – 2016-07-09 (×5): 3 mL via INTRAVENOUS

## 2016-07-07 MED ORDER — OXYCODONE HCL 5 MG PO TABS
5.0000 mg | ORAL_TABLET | ORAL | Status: DC | PRN
  Administered 2016-07-07: 10 mg via ORAL
  Filled 2016-07-07: qty 2

## 2016-07-07 MED ORDER — AMIODARONE LOAD VIA INFUSION
150.0000 mg | Freq: Once | INTRAVENOUS | Status: AC
Start: 2016-07-07 — End: 2016-07-07
  Administered 2016-07-07: 150 mg via INTRAVENOUS
  Filled 2016-07-07: qty 83.34

## 2016-07-07 MED ORDER — ASPIRIN EC 81 MG PO TBEC
81.0000 mg | DELAYED_RELEASE_TABLET | Freq: Every day | ORAL | Status: DC
  Administered 2016-07-07 – 2016-07-10 (×4): 81 mg via ORAL
  Filled 2016-07-07 (×4): qty 1

## 2016-07-07 MED ORDER — SODIUM CHLORIDE 0.9% FLUSH: 3.0000 mL | INTRAVENOUS | Status: DC | PRN

## 2016-07-07 MED FILL — Electrolyte-R (PH 7.4) Solution: INTRAVENOUS | Qty: 6000 | Status: AC

## 2016-07-07 MED FILL — Sodium Chloride IV Soln 0.9%: INTRAVENOUS | Qty: 2000 | Status: AC

## 2016-07-07 MED FILL — Sodium Bicarbonate IV Soln 8.4%: INTRAVENOUS | Qty: 50 | Status: AC

## 2016-07-07 MED FILL — Lidocaine HCl IV Inj 20 MG/ML: INTRAVENOUS | Qty: 5 | Status: AC

## 2016-07-07 MED FILL — Heparin Sodium (Porcine) Inj 1000 Unit/ML: INTRAMUSCULAR | Qty: 10 | Status: AC

## 2016-07-07 MED FILL — Mannitol IV Soln 20%: INTRAVENOUS | Qty: 500 | Status: AC

## 2016-07-07 NOTE — Op Note (Signed)
NAME:  Joseph Kidd, Joseph Kidd               ACCOUNT NO.:  [id number]  MEDICAL RECORD NO.:  [id number]  LOCATION:                                 FACILITY:  PHYSICIAN:  Sheliah PlaneEdward Percell Lamboy, MD         DATE OF BIRTH:  DATE OF PROCEDURE:  07/04/2016  OPERATIVE REPORT   PREOPERATIVE DIAGNOSIS:  Acute myocardial infarction with severe 3- vessel coronary artery disease.  POSTOPERATIVE DIAGNOSIS:  Acute myocardial infarction with severe 3- vessel coronary artery disease.  SURGICAL PROCEDURES:  Urgent coronary artery bypass grafting x5 with the left internal mammary to the left anterior descending coronary artery, reverse saphenous vein graft to the diagonal coronary artery, sequential reverse saphenous vein graft to the first obtuse marginal and distal circumflex, reverse saphenous vein graft to the posterior descending coronary artery with endovein harvesting of the right greater saphenous vein, thigh and calf. Placement of right femoral arterial line   SURGEON:  Sheliah PlaneEdward Corlette Ciano, MD  FIRST ASSISTANT:  Doree Fudgeonielle Zimmerman, P.A.  BRIEF HISTORY:  The patient is a 66 year old male with no previous cardiac history, who presented late in the afternoon the day prior to surgery with new onset of angina.  He was taken from the emergency room to the cath lab for cardiac catheterization.  With treatment, the patient became pain free.  Cardiac cath revealed significant 3-vessel coronary artery disease with complex 90% stenosis of the LAD and diagonal, 80% stenosis of the first obtuse marginal with total occlusion of the distal circumflex which was the infarct vessel, and diffuse disease throughout the right coronary artery with a mid 95% stenosis. Prior to Cardiac Surgery consultation, the patient's radial and femoral sheaths were removed.  The patient was seen as an urgent consult in the cath lab.  At this point, he was without chest pain and he was transferred to the CCU for emergent bypass the following  morning.  Risks and options were discussed with him in detail and he was agreeable with proceeding.  DESCRIPTION OF PROCEDURE:  With Swan-Ganz and arterial line monitors in place, the patient underwent general endotracheal anesthesia without incident.  A TEE probe was placed by Dr. Maple HudsonMoser.  Skin of the chest, neck, and legs was prepped with Betadine and draped in usual sterile manner.  Appropriate time-out was performed and we proceeded with endovein harvesting of the right greater saphenous vein in the thigh and calf.  The vein was of good quality.  Median sternotomy was performed. Left internal mammary artery was dissected down as a pedicle graft.  The distal artery was divided, it had good free flow.  Pericardium was opened.  Overall, ventricular function appeared preserved, though both on examination of the heart and with TEE, there was area of the inferior lateral wall which was hypokinetic, consistent with the patient's recent acute myocardial infarction involving the circumflex.  The patient was systemically heparinized.  Ascending aorta was cannulated.  The right atrium was cannulated and aortic root vent cardioplegia needle was introduced into the ascending aorta.  The patient was placed on cardiopulmonary bypass 2.4 L/min/m2.  Sites of anastomosis were selected and dissected out of the epicardium.  The patient's body temperature was cooled to 32 degrees.  Aortic crossclamp was applied.  800 mL of cold blood potassium cardioplegia was administered with diastolic arrest of the  heart.  Myocardial septal temperature was monitored throughout the cross-clamp.  Attention was turned first to the posterior descending coronary artery, which was opened and admitted a 1.5 mm probe distally. Using a running 7-0 Prolene, distal anastomosis was performed.  The heart was then elevated and attention was turned to the lateral wall. The first obtuse marginal vessel was of reasonable size, was  opened, admitted 1.5 mm probe using a diamond type side-to-side anastomosis with a running 8-0 Prolene.  Anastomosis was completed.  Distal extent of the same vein was carried to the distal circumflex branch which was a smaller vessel, but admitted a 1 mm probe distally.  Using a running 7-0 Prolene, distal anastomosis was performed.  We then turned our attention to the diagonal coronary artery which was opened and diffusely diseased. Using a running 7-0 Prolene, distal anastomosis was performed with second reverse saphenous vein graft.  The left anterior descending coronary artery was then used to the LAD, the LAD was opened and admitted a 1.5 mm probe distally.  Proximally, using a running 8-0 Prolene, left internal mammary artery was anastomosed to the left anterior descending coronary artery.  With the crossclamp still in place, 3 punch aortotomies were performed and each of the 3 vein grafts were anastomosed to the ascending aorta.  Air was evacuated from the grafts.  Bulldog on the mammary artery was removed with prompt rise in myocardial septal temperature.  Aortic crossclamp was removed with a total crossclamp time of 119 minutes.  The patient did require electrical defibrillation turned to a sinus rhythm.  Sites of anastomosis were inspected, free of bleeding.  With the patient rewarmed to 37 degrees, atrial and ventricular pacing wires were applied.  The patient was then ventilated and weaned from cardiopulmonary bypass on low-dose Milrinone.  He remained hemodynamically stable, was decannulated in usual fashion.  Protamine sulfate was administered with operative field hemostatic.  Graft markers were applied.  Left pleural tube was placed.  Blake mediastinal drain was left in place. Pericardium was loosely reapproximated.  Sternum was closed with #6 stainless steel wire.  Fascia was closed with interrupted 0 Vicryl, running 3-0 Vicryl in subcutaneous tissue, 4-0 subcuticular  stitch in skin edges.  Dry dressings were applied.  As we were attempting to come off bypass, the patient's left radial arterial line was not functioning and a percutaneous right femoral arterial line was placed without difficulty.  The patient did not require any blood bank blood products during the procedure.  Total pump time was 181 minutes.  The patient tolerated the procedure without obvious complication and was transferred to the Surgical Intensive Care Unit for further postoperative care. Sponge and needle count were reported as correct.     Sheliah Plane, MD   ______________________________ Sheliah Plane, MD    EG/MEDQ  D:  07/07/2016  T:  07/07/2016  Job:  [id number]

## 2016-07-07 NOTE — Progress Notes (Signed)
Pt returned to AF RVR while ambulating in the hallway. HR reached 150, and returned to 113-120 with rest. BP 118/79 when returned to bed. Dr. Tyrone SageGerhardt notified. Verbal orders for Amio 150mg  bolus with gtt, and to cancel transfer given. Will continue to monitor pt closely.

## 2016-07-07 NOTE — Progress Notes (Signed)
Patient ID: Joseph Kidd, male   DOB: 05-Apr-1951, 66 y.o.   MRN: [id number] T[facility] DAILY ICU PROGRESS NOTE                   [address]            [place] [id number]          [phone]   3 Days Post-Op Procedure(s) (LRB): CORONARY ARTERY BYPASS GRAFT times five with left internal mammary artery to Left Anterior Descending artery : endoscopic harvest of right saphenous vein with grafts to Diagonal, Right Coronary, Obtuse Marginal 1, Obtuse Marginal 3 Arteries. Insertion of right groin femoral a line (N/A) INTRAOPERATIVE TRANSESOPHAGEAL ECHOCARDIOGRAM (N/A)  Total Length of Stay:  LOS: 4 days   Subjective: Up to cahir, walked 200 feet this am , off neo  Objective: Vital signs in last 24 hours: Temp:  [97.5 F (36.4 C)-98.7 F (37.1 C)] 98.3 F (36.8 C) (03/12 0000) Pulse Rate:  [86-124] 86 (03/12 0600) Cardiac Rhythm: Atrial paced (03/11 2000) Resp:  [11-32] 32 (03/12 0600) BP: (89-119)/(61-96) 113/96 (03/12 0600) SpO2:  [92 %-99 %] 94 % (03/12 0600) Arterial Line BP: (108-125)/(61-70) 108/61 (03/11 1000) Weight:  [277 lb (125.6 kg)] 277 lb (125.6 kg) (03/12 0600)  Filed Weights   07/05/16 0430 07/06/16 0530 07/07/16 0600  Weight: 271 lb 9.7 oz (123.2 kg) 275 lb 2.2 oz (124.8 kg) 277 lb (125.6 kg)    Weight change: 1 lb 13.9 oz (0.846 kg)   Hemodynamic parameters for last 24 hours:    Intake/Output from previous day: 03/11 0701 - 03/12 0700 In: 1580 [P.O.:420; I.V.:1160] Out: 1550 [Urine:1470; Chest Tube:80]  Intake/Output this shift: No intake/output data recorded.  Current Meds: Scheduled Meds: . acetaminophen  1,000 mg Oral Q6H  . aspirin EC  325 mg Oral Daily  . bisacodyl  10 mg Oral Daily   Or  . bisacodyl  10 mg Rectal Daily  . docusate sodium  200 mg Oral Daily  . enoxaparin (LOVENOX) injection  40 mg Subcutaneous Q24H  . insulin aspart  0-15 Units Subcutaneous TID WC  . mouth rinse  15 mL Mouth Rinse BID  . metoprolol tartrate   12.5 mg Oral BID  . pantoprazole  40 mg Oral Daily  . sodium chloride flush  3 mL Intravenous Q12H   Continuous Infusions: . sodium chloride    . amiodarone 30 mg/hr (07/07/16 0400)  . insulin (NOVOLIN-R) infusion Stopped (07/05/16 1400)  . lactated ringers 20 mL/hr at 07/07/16 0400  . phenylephrine (NEO-SYNEPHRINE) Adult infusion Stopped (07/06/16 1700)   PRN Meds:.metoprolol, morphine injection, ondansetron (ZOFRAN) IV, oxyCODONE, sodium chloride flush, traMADol  General appearance: alert, cooperative and no distress Neurologic: intact Heart: regular rate and rhythm, S1, S2 normal, no murmur, click, rub or gallop Lungs: diminished breath sounds bibasilar Abdomen: soft, non-tender; bowel sounds normal; no masses,  no organomegaly Extremities: extremities normal, atraumatic, no cyanosis or edema and Homans sign is negative, no sign of DVT Wound: sternum stable   Lab Results: CBC: Recent Labs  07/06/16 0343 07/07/16 0500  WBC 11.9* 7.8  HGB 8.1* 7.4*  HCT 24.4* 22.4*  PLT 119* 104*   BMET:  Recent Labs  07/06/16 0343 07/07/16 0500  NA 137 135  K 4.2 3.9  CL 109 105  CO2 24 26  GLUCOSE 119* 115*  BUN 30* 30*  CREATININE 0.92 0.86  CALCIUM 7.6* 7.6*    CMET: Lab Results  Component Value  Date   WBC 7.8 07/07/2016   HGB 7.4 (L) 07/07/2016   HCT 22.4 (L) 07/07/2016   PLT 104 (L) 07/07/2016   GLUCOSE 115 (H) 07/07/2016   CHOL 248 (H) 07/04/2016   CHOL 241 (H) 07/04/2016   TRIG 129 07/04/2016   TRIG 141 07/04/2016   HDL 23 (L) 07/04/2016   HDL 23 (L) 07/04/2016   LDLCALC 199 (H) 07/04/2016   LDLCALC 190 (H) 07/04/2016   ALT 28 07/07/2016   AST 49 (H) 07/07/2016   NA 135 07/07/2016   K 3.9 07/07/2016   CL 105 07/07/2016   CREATININE 0.86 07/07/2016   BUN 30 (H) 07/07/2016   CO2 26 07/07/2016   INR 1.77 07/04/2016   HGBA1C 5.5 07/03/2016      PT/INR:  Recent Labs  07/04/16 1459  LABPROT 20.8*  INR 1.77   Radiology: No results  found.   Assessment/Plan: S/P Procedure(s) (LRB): CORONARY ARTERY BYPASS GRAFT times five with left internal mammary artery to Left Anterior Descending artery : endoscopic harvest of right saphenous vein with grafts to Diagonal, Right Coronary, Obtuse Marginal 1, Obtuse Marginal 3 Arteries. Insertion of right groin femoral a line (N/A) INTRAOPERATIVE TRANSESOPHAGEAL ECHOCARDIOGRAM (N/A) Mobilize Diuresis Diabetes control Plan for transfer to step-down: see transfer orders Expected Acute  Blood - loss Anemia Postop afib briefly yesterday, now sinus  Expected Acute  Blood - loss Anemia    Delight Ovensdward B Marta Bouie 07/07/2016 7:15 AM

## 2016-07-07 NOTE — Progress Notes (Signed)
      [address]       [place] [id number]             [phone]      Resting comfortably  BP 111/80   Pulse 80   Temp 98.1 F (36.7 C) (Oral)   Resp 15   Ht 5\' 10"  (1.778 m)   Wt 277 lb (125.6 kg)   SpO2 96%   BMI 39.75 kg/m    Intake/Output Summary (Last 24 hours) at 07/07/16 2126 Last data filed at 07/07/16 1900  Gross per 24 hour  Intake            767.3 ml  Output              925 ml  Net           -157.7 ml   Rapid atrial fib earlier, now in SR on [employer] [employer]  Ameen Mostafa C. Dorris FetchHendrickson, MD [facility] [phone]

## 2016-07-08 ENCOUNTER — Inpatient Hospital Stay (HOSPITAL_COMMUNITY): Payer: Medicare Other

## 2016-07-08 LAB — GLUCOSE, CAPILLARY
Glucose-Capillary: 99 mg/dL (ref 65–99)
Glucose-Capillary: 98 mg/dL (ref 65–99)
Glucose-Capillary: 117 mg/dL — ABNORMAL HIGH (ref 65–99)
Glucose-Capillary: 93 mg/dL (ref 65–99)

## 2016-07-08 LAB — CBC
HCT: 22.1 % — ABNORMAL LOW (ref 39.0–52.0)
Hemoglobin: 7.4 g/dL — ABNORMAL LOW (ref 13.0–17.0)
MCH: 29.8 pg (ref 26.0–34.0)
MCHC: 33.5 g/dL (ref 30.0–36.0)
MCV: 89.1 fL (ref 78.0–100.0)
Platelets: 142 10*3/uL — ABNORMAL LOW (ref 150–400)
RBC: 2.48 MIL/uL — ABNORMAL LOW (ref 4.22–5.81)
RDW: 15.5 % (ref 11.5–15.5)
WBC: 8.4 10*3/uL (ref 4.0–10.5)

## 2016-07-08 LAB — BASIC METABOLIC PANEL
Anion gap: 5 (ref 5–15)
BUN: 28 mg/dL — ABNORMAL HIGH (ref 6–20)
CO2: 27 mmol/L (ref 22–32)
Calcium: 7.9 mg/dL — ABNORMAL LOW (ref 8.9–10.3)
Chloride: 106 mmol/L (ref 101–111)
Creatinine, Ser: 0.79 mg/dL (ref 0.61–1.24)
GFR calc Af Amer: >60 mL/min (ref >60)
GFR calc non Af Amer: >60 mL/min (ref >60)
Glucose, Bld: 108 mg/dL — ABNORMAL HIGH (ref 65–99)
Potassium: 4 mmol/L (ref 3.5–5.1)
Sodium: 138 mmol/L (ref 135–145)

## 2016-07-08 MED ORDER — AMIODARONE HCL 200 MG PO TABS
400.0000 mg | ORAL_TABLET | Freq: Two times a day (BID) | ORAL | Status: DC
  Administered 2016-07-08 – 2016-07-10 (×5): 400 mg via ORAL
  Filled 2016-07-08 (×5): qty 2

## 2016-07-08 MED ORDER — AMIODARONE HCL 200 MG PO TABS: 400.0000 mg | ORAL_TABLET | Freq: Every day | ORAL | Status: DC

## 2016-07-08 MED ORDER — AMIODARONE HCL 200 MG PO TABS: 400.0000 mg | ORAL_TABLET | Freq: Two times a day (BID) | ORAL | Status: DC

## 2016-07-08 NOTE — Progress Notes (Signed)
Patient ID: Joseph Kidd, male   DOB: 05/13/1950, 66 y.o.   MRN: [id number] TCTS DAILY ICU PROGRESS NOTE                   [address]            [place], [id number]          [phone]   4 Days Post-Op Procedure(s) (LRB): CORONARY ARTERY BYPASS GRAFT times five with left internal mammary artery to Left Anterior Descending artery : endoscopic harvest of right saphenous vein with grafts to Diagonal, Right Coronary, Obtuse Marginal 1, Obtuse Marginal 3 Arteries. Insertion of right groin femoral a line (N/A) INTRAOPERATIVE TRANSESOPHAGEAL ECHOCARDIOGRAM (N/A)  Total Length of Stay:  LOS: 5 days   Subjective: Feels much better today, nausea resolved  Objective: Vital signs in last 24 hours: Temp:  [98.1 F (36.7 C)-98.4 F (36.9 C)] 98.1 F (36.7 C) (03/12 2000) Pulse Rate:  [66-143] 86 (03/13 0800) Cardiac Rhythm: Normal sinus rhythm (03/13 0800) Resp:  [9-27] 15 (03/13 0800) BP: (96-151)/(62-93) 113/67 (03/13 0800) SpO2:  [89 %-98 %] 89 % (03/13 0800) Weight:  [275 lb 5.7 oz (124.9 kg)] 275 lb 5.7 oz (124.9 kg) (03/13 0500)  Filed Weights   07/06/16 0530 07/07/16 0600 07/08/16 0500  Weight: 275 lb 2.2 oz (124.8 kg) 277 lb (125.6 kg) 275 lb 5.7 oz (124.9 kg)    Weight change: -1 lb 10.3 oz (-0.746 kg)   Hemodynamic parameters for last 24 hours:    Intake/Output from previous day: 03/12 0701 - 03/13 0700 In: 710.7 [P.O.:360; I.V.:350.7] Out: 1025 [Urine:1025]  Intake/Output this shift: Total I/O In: 19.7 [I.V.:19.7] Out: -   Current Meds: Scheduled Meds: . aspirin EC  81 mg Oral Daily  . docusate sodium  200 mg Oral Daily  . enoxaparin (LOVENOX) injection  40 mg Subcutaneous Q24H  . ferrous sulfate  325 mg Oral BID WC  . folic acid  1 mg Oral Daily  . furosemide  40 mg Oral Daily  . insulin aspart  0-24 Units Subcutaneous TID AC & HS  . mouth rinse  15 mL Mouth Rinse BID  . metoprolol tartrate  12.5 mg Oral BID  . pantoprazole  40 mg Oral  QAC breakfast  . potassium chloride  20 mEq Oral Daily  . sodium chloride flush  3 mL Intravenous Q12H   Continuous Infusions: . amiodarone 30 mg/hr (07/08/16 0800)   PRN Meds:.sodium chloride, bisacodyl **OR** bisacodyl, guaiFENesin, ondansetron **OR** ondansetron (ZOFRAN) IV, oxyCODONE, sodium chloride flush, traMADol  General appearance: alert and cooperative Neurologic: intact Heart: regular rate and rhythm, S1, S2 normal, no murmur, click, rub or gallop Lungs: diminished breath sounds bibasilar Abdomen: soft, non-tender; bowel sounds normal; no masses,  no organomegaly Extremities: extremities normal, atraumatic, no cyanosis or edema and Homans sign is negative, no sign of DVT Wound: sternum stable  Lab Results: CBC: Recent Labs  07/07/16 0500 07/08/16 0520  WBC 7.8 8.4  HGB 7.4* 7.4*  HCT 22.4* 22.1*  PLT 104* 142*   BMET:  Recent Labs  07/07/16 0500 07/08/16 0520  NA 135 138  K 3.9 4.0  CL 105 106  CO2 26 27  GLUCOSE 115* 108*  BUN 30* 28*  CREATININE 0.86 0.79  CALCIUM 7.6* 7.9*    CMET: Lab Results  Component Value Date   WBC 8.4 07/08/2016   HGB 7.4 (L) 07/08/2016   HCT 22.1 (L) 07/08/2016   PLT 142 (L)  07/08/2016   GLUCOSE 108 (H) 07/08/2016   CHOL 248 (H) 07/04/2016   CHOL 241 (H) 07/04/2016   TRIG 129 07/04/2016   TRIG 141 07/04/2016   HDL 23 (L) 07/04/2016   HDL 23 (L) 07/04/2016   LDLCALC 199 (H) 07/04/2016   LDLCALC 190 (H) 07/04/2016   ALT 28 07/07/2016   AST 49 (H) 07/07/2016   NA 138 07/08/2016   K 4.0 07/08/2016   CL 106 07/08/2016   CREATININE 0.79 07/08/2016   BUN 28 (H) 07/08/2016   CO2 27 07/08/2016   INR 1.77 07/04/2016   HGBA1C 5.5 07/03/2016      PT/INR: No results for input(s): LABPROT, INR in the last 72 hours. Radiology: Dg Chest 2 View  Result Date: 07/08/2016 CLINICAL DATA:  CABG. EXAM: CHEST  2 VIEW COMPARISON:  07/07/2016. FINDINGS: Right IJ sheath in stable position. Prior CABG. Cardiomegaly. No pulmonary  venous congestion. Interim partial clearing of basilar infiltrates/edema. Low lung volumes with mild basilar atelectasis. No pneumothorax . IMPRESSION: 1. Right IJ sheath in stable position. 2. Prior CABG. Stable cardiomegaly. Interim partial clearing of bilateral pulmonary infiltrates/ edema. Small left pleural effusion. 3. Low lung volumes with basilar atelectasis. Electronically Signed   By: Maisie Fus  Register   On: 07/08/2016 07:24     Assessment/Plan: S/P Procedure(s) (LRB): CORONARY ARTERY BYPASS GRAFT times five with left internal mammary artery to Left Anterior Descending artery : endoscopic harvest of right saphenous vein with grafts to Diagonal, Right Coronary, Obtuse Marginal 1, Obtuse Marginal 3 Arteries. Insertion of right groin femoral a line (N/A) INTRAOPERATIVE TRANSESOPHAGEAL ECHOCARDIOGRAM (N/A) Mobilize Diuresis Diabetes control Plan for transfer to step-down: see transfer orders Expected Acute  Blood - loss Anemia Postop afib briefly yesterday, now sinus since yesterday Will change back to po Cordarone  On iron and folate   Delight Ovens 07/08/2016 9:33 AM

## 2016-07-09 LAB — BASIC METABOLIC PANEL
Anion gap: 6 (ref 5–15)
BUN: 28 mg/dL — ABNORMAL HIGH (ref 6–20)
CO2: 27 mmol/L (ref 22–32)
Calcium: 7.8 mg/dL — ABNORMAL LOW (ref 8.9–10.3)
Chloride: 103 mmol/L (ref 101–111)
Creatinine, Ser: 0.97 mg/dL (ref 0.61–1.24)
GFR calc Af Amer: >60 mL/min (ref >60)
GFR calc non Af Amer: >60 mL/min (ref >60)
Glucose, Bld: 108 mg/dL — ABNORMAL HIGH (ref 65–99)
Potassium: 3.9 mmol/L (ref 3.5–5.1)
Sodium: 136 mmol/L (ref 135–145)

## 2016-07-09 LAB — CBC
HCT: 22.9 % — ABNORMAL LOW (ref 39.0–52.0)
Hemoglobin: 7.5 g/dL — ABNORMAL LOW (ref 13.0–17.0)
MCH: 29 pg (ref 26.0–34.0)
MCHC: 32.8 g/dL (ref 30.0–36.0)
MCV: 88.4 fL (ref 78.0–100.0)
Platelets: 225 10*3/uL (ref 150–400)
RBC: 2.59 MIL/uL — ABNORMAL LOW (ref 4.22–5.81)
RDW: 15 % (ref 11.5–15.5)
WBC: 8.2 10*3/uL (ref 4.0–10.5)

## 2016-07-09 LAB — GLUCOSE, CAPILLARY
GLUCOSE-CAPILLARY: 106 mg/dL — AB (ref 65–99)
GLUCOSE-CAPILLARY: 89 mg/dL (ref 65–99)
Glucose-Capillary: 100 mg/dL — ABNORMAL HIGH (ref 65–99)
Glucose-Capillary: 85 mg/dL (ref 65–99)

## 2016-07-09 MED ORDER — ATORVASTATIN CALCIUM 10 MG PO TABS: 10.0000 mg | ORAL_TABLET | Freq: Every day | ORAL | Status: DC

## 2016-07-09 MED ORDER — FUROSEMIDE 10 MG/ML IJ SOLN
40.0000 mg | Freq: Once | INTRAMUSCULAR | Status: AC
  Administered 2016-07-09: 40 mg via INTRAVENOUS
  Filled 2016-07-09: qty 4

## 2016-07-09 MED ORDER — POTASSIUM CHLORIDE CRYS ER 20 MEQ PO TBCR
20.0000 meq | EXTENDED_RELEASE_TABLET | Freq: Once | ORAL | Status: AC
  Administered 2016-07-09: 20 meq via ORAL
  Filled 2016-07-09: qty 1

## 2016-07-09 MED ORDER — FUROSEMIDE 40 MG PO TABS: 40.0000 mg | ORAL_TABLET | Freq: Every day | ORAL | Status: DC

## 2016-07-09 NOTE — Progress Notes (Signed)
CARDIAC REHAB PHASE I   PRE:  Rate/Rhythm: 72 SR  BP:  Sitting: 131/86        SaO2: 99 RA  MODE:  Ambulation: 300 ft   POST:  Rate/Rhythm: 102 ST  BP:  Sitting: 154/99         SaO2: 99 RA  Pt ambulated 300 ft on RA, rolling walker, hand held assist, steady gait, tolerated fairly well. Pt c/o DOE, fatigue with distance, denies any other complaints, brief standing rest x4. Sats 98-99% on RA. Encouraged IS, additional ambulation x2 today. Pt to recliner after walk, call bell within reach. Will follow.  [phone] Joylene GrapesEmily C Rowynn Mcweeney, RN, BSN 07/09/2016 10:41 AM

## 2016-07-09 NOTE — Discharge Instructions (Signed)
Coronary Artery Bypass Grafting, Care After ° °This sheet gives you information about how to care for yourself after your procedure. Your health care provider may also give you more specific instructions. If you have problems or questions, contact your health care provider. °What can I expect after the procedure? °After the procedure, it is common to have: °· Nausea and a lack of appetite. °· Constipation. °· Weakness and fatigue. °· Depression or irritability. °· Pain or discomfort in your incision areas. °Follow these instructions at home: °Medicines  °· Take over-the-counter and prescription medicines only as told by your health care provider. Do not stop taking medicines or start any new medicines without approval from your health care provider. °· If you were prescribed an antibiotic medicine, take it as told by your health care provider. Do not stop taking the antibiotic even if you start to feel better. °· Do not drive or use heavy machinery while taking prescription pain medicine. °Incision care  °· Follow instructions from your health care provider about how to take care of your incisions. Make sure you: °¨ Wash your hands with soap and water before you change your bandage (dressing). If soap and water are not available, use hand sanitizer. °¨ Change your dressing as told by your health care provider. °¨ Leave stitches (sutures), skin glue, or adhesive strips in place. These skin closures may need to stay in place for 2 weeks or longer. If adhesive strip edges start to loosen and curl up, you may trim the loose edges. Do not remove adhesive strips completely unless your health care provider tells you to do that. °· Keep incision areas clean, dry, and protected. °· Check your incision areas every day for signs of infection. Check for: °¨ More redness, swelling, or pain. °¨ More fluid or blood. °¨ Warmth. °¨ Pus or a bad smell. °· If incisions were made in your legs: °¨ Avoid crossing your legs. °¨ Avoid  sitting for long periods of time. Change positions every 30 minutes. °¨ Raise (elevate) your legs when you are sitting. °Bathing  °· Do not take baths, swim, or use a hot tub until your health care provider approves. °· Only take sponge baths. Pat the incisions dry. Do not rub incisions with a washcloth or towel. °· Ask your health care provider when you can shower. °Eating and drinking  °· Eat foods that are high in fiber, such as raw fruits and vegetables, whole grains, beans, and nuts. Meats should be lean cut. Avoid canned, processed, and fried foods. This can help prevent constipation and is a recommended part of a heart-healthy diet. °· Drink enough fluid to keep your urine clear or pale yellow. °· Limit alcohol intake to no more than 1 drink a day for nonpregnant women and 2 drinks a day for men. One drink equals 12 oz of beer, 5 oz of wine, or 1½ oz of hard liquor. °Activity  °· Rest and limit your activity as told by your health care provider. You may be instructed to: °¨ Stop any activity right away if you have chest pain, shortness of breath, irregular heartbeats, or dizziness. Get help right away if you have any of these symptoms. °¨ Move around frequently for short periods or take short walks as directed by your health care provider. Gradually increase your activities. You may need physical therapy or cardiac rehabilitation to help strengthen your muscles and build your endurance. °¨ Avoid lifting, pushing, or pulling anything that is heavier than 10   lb (4.5 kg) for at least 6 weeks or as told by your health care provider. °· Do not drive until your health care provider approves. °· Ask your health care provider when you may return to work. °· Ask your health care provider when you may resume sexual activity. °General instructions  °· Do not use any products that contain nicotine or tobacco, such as cigarettes and e-cigarettes. If you need help quitting, ask your health care provider. °· Take 2-3 deep  breaths every few hours during the day, while you recover. This helps expand your lungs and prevent complications like pneumonia after surgery. °· If you were given a device called an incentive spirometer, use it several times a day to practice deep breathing. Support your chest with a pillow or your arms when you take deep breaths or cough. °· Wear compression stockings as told by your health care provider. These stockings help to prevent blood clots and reduce swelling in your legs. °· Weigh yourself every day. This helps identify if your body is holding (retaining) fluid that may make your heart and lungs work harder. °· Keep all follow-up visits as told by your health care provider. This is important. °Contact a health care provider if: °· You have more redness, swelling, or pain around any incision. °· You have more fluid or blood coming from any incision. °· Any incision feels warm to the touch. °· You have pus or a bad smell coming from any incision °· You have a fever. °· You have swelling in your ankles or legs. °· You have pain in your legs. °· You gain 2 lb (0.9 kg) or more a day. °· You are nauseous or you vomit. °· You have diarrhea. °Get help right away if: °· You have chest pain that spreads to your jaw or arms. °· You are short of breath. °· You have a fast or irregular heartbeat. °· You notice a "clicking" in your breastbone (sternum) when you move. °· You have numbness or weakness in your arms or legs. °· You feel dizzy or light-headed. °Summary °· After the procedure, it is common to have pain or discomfort in the incision areas. °· Do not take baths, swim, or use a hot tub until your health care provider approves. °· Gradually increase your activities. You may need physical therapy or cardiac rehabilitation to help strengthen your muscles and build your endurance. °· Weigh yourself every day. This helps identify if your body is holding (retaining) fluid that may make your heart and lungs work  harder. °This information is not intended to replace advice given to you by your health care provider. Make sure you discuss any questions you have with your health care provider. °Document Released: 11/01/2004 Document Revised: 03/03/2016 Document Reviewed: 03/03/2016 °Elsevier Interactive Patient Education © 2017 [employer] ° °
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
## 2016-07-09 NOTE — Progress Notes (Signed)
07/09/2016 9:13 AM EPW D/C'd per order and per protocol.  Ends intact. Pt. Tolerated well.  Advised bedrest x1hr.  Call bell in reach.  Vital signs collected per protocol. Kathryne HitchAllen, Galia Rahm C
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
## 2016-07-09 NOTE — Care Management Important Message (Signed)
Important Message  Patient Details  Name: Joseph Kidd MRN: [id number] Date of Birth: 12/09/1950   Medicare Important Message Given:  Yes    Kyla BalzarineShealy, Davan Nawabi Abena 07/09/2016, 11:31 AM

## 2016-07-09 NOTE — Progress Notes (Signed)
Progress Note  Patient Name: Joseph Kidd Date of Encounter: 07/09/2016  Primary Cardiologist: Dr Peter Swaziland  Subjective   Postop day 5 coronary artery bypass grafting 5. Patient ambulate with cardiac rehabilitation without limitation. He denies chest pain or shortness of breath.  Inpatient Medications    Scheduled Meds: . amiodarone  400 mg Oral Q12H   Followed by  . [START ON 07/15/2016] amiodarone  400 mg Oral Daily  . aspirin EC  81 mg Oral Daily  . docusate sodium  200 mg Oral Daily  . enoxaparin (LOVENOX) injection  40 mg Subcutaneous Q24H  . ferrous sulfate  325 mg Oral BID WC  . folic acid  1 mg Oral Daily  . [START ON 07/10/2016] furosemide  40 mg Oral Daily  . mouth rinse  15 mL Mouth Rinse BID  . metoprolol tartrate  12.5 mg Oral BID  . pantoprazole  40 mg Oral QAC breakfast  . potassium chloride  20 mEq Oral Daily  . sodium chloride flush  3 mL Intravenous Q12H   Continuous Infusions:  PRN Meds: sodium chloride, bisacodyl **OR** bisacodyl, guaiFENesin, ondansetron **OR** ondansetron (ZOFRAN) IV, oxyCODONE, sodium chloride flush, traMADol   Vital Signs    Vitals:   07/09/16 0930 07/09/16 0945 07/09/16 0951 07/09/16 1000  BP: 107/69 114/70 114/70 114/79  Pulse:  75 75 75  Resp:      Temp:      TempSrc:      SpO2:      Weight:      Height:        Intake/Output Summary (Last 24 hours) at 07/09/16 1053 Last data filed at 07/08/16 1400  Gross per 24 hour  Intake           260.88 ml  Output                0 ml  Net           260.88 ml   Filed Weights   07/07/16 0600 07/08/16 0500 07/09/16 0405  Weight: 277 lb (125.6 kg) 275 lb 5.7 oz (124.9 kg) 272 lb 14.4 oz (123.8 kg)    Telemetry    Normal sinus rhythm with occasional PVCs - Personally Reviewed  ECG    Not performed today - Personally Reviewed  Physical Exam   GEN: No acute distress.   Neck: No JVD Cardiac: RRR, no murmurs, rubs, or gallops.  Respiratory: Clear to auscultation  bilaterally. GI: Soft, nontender, non-distended  MS: Trace lower extremity edema; No deformity. Neuro:  Nonfocal  Psych: Normal affect   Labs    Chemistry Recent Labs Lab 07/04/16 0011  07/07/16 0500 07/08/16 0520 07/09/16 0156  NA 138  < > 135 138 136  K 3.0*  < > 3.9 4.0 3.9  CL 102  < > 105 106 103  CO2 26  < > 26 27 27   GLUCOSE 122*  < > 115* 108* 108*  BUN 16  < > 30* 28* 28*  CREATININE 0.91  < > 0.86 0.79 0.97  CALCIUM 9.1  < > 7.6* 7.9* 7.8*  PROT 6.9  --  4.9*  --   --   ALBUMIN 3.7  --  2.6*  --   --   AST 110*  --  49*  --   --   ALT 41  --  28  --   --   ALKPHOS 55  --  38  --   --  BILITOT 0.7  --  0.5  --   --   GFRNONAA >60  < > >60 >60 >60  GFRAA >60  < > >60 >60 >60  ANIONGAP 10  < > 4* 5 6  < > = values in this interval not displayed.   Hematology Recent Labs Lab 07/07/16 0500 07/08/16 0520 07/09/16 0156  WBC 7.8 8.4 8.2  RBC 2.53* 2.48* 2.59*  HGB 7.4* 7.4* 7.5*  HCT 22.4* 22.1* 22.9*  MCV 88.5 89.1 88.4  MCH 29.2 29.8 29.0  MCHC 33.0 33.5 32.8  RDW 15.1 15.5 15.0  PLT 104* 142* 225    Cardiac Enzymes Recent Labs Lab 07/03/16 1837 07/04/16 0011 07/04/16 0436  TROPONINI 0.97* 10.43* 24.97*    Recent Labs Lab 07/03/16 1602  TROPIPOC 0.01     BNPNo results for input(s): BNP, PROBNP in the last 168 hours.   DDimer No results for input(s): DDIMER in the last 168 hours.   Radiology    Dg Chest 2 View  Result Date: 07/08/2016 CLINICAL DATA:  CABG. EXAM: CHEST  2 VIEW COMPARISON:  07/07/2016. FINDINGS: Right IJ sheath in stable position. Prior CABG. Cardiomegaly. No pulmonary venous congestion. Interim partial clearing of basilar infiltrates/edema. Low lung volumes with mild basilar atelectasis. No pneumothorax . IMPRESSION: 1. Right IJ sheath in stable position. 2. Prior CABG. Stable cardiomegaly. Interim partial clearing of bilateral pulmonary infiltrates/ edema. Small left pleural effusion. 3. Low lung volumes with basilar  atelectasis. Electronically Signed   By: Maisie Fus  Register   On: 07/08/2016 07:24    Cardiac Studies   Cardiac catheterization 07/03/16   Conclusion     Prox RCA to Mid RCA lesion, 95 %stenosed.  Ost RCA to Prox RCA lesion, 30 %stenosed.  Mid RCA lesion, 40 %stenosed.  Prox LAD lesion, 95 %stenosed.  Mid LAD-2 lesion, 75 %stenosed.  Mid LAD-1 lesion, 80 %stenosed.  Ost 1st Mrg to 1st Mrg lesion, 90 %stenosed.  Prox Cx to Mid Cx lesion, 100 %stenosed.  The left ventricular systolic function is normal.  LV end diastolic pressure is normal.  The left ventricular ejection fraction is 50-55% by visual estimate.   1. Critical 3 vessel obstructive CAD.     - complex 95% proximal LAD, 75-80% diffuse mid LAD    - 90% OM1    - 100% mid LCx with left to left and right to left collaterals    - 95% proximal RCA 2. Good LV function 3. Normal LVEDP  Plan: Patient is currently pain free- will aggressively threat medically with ASA, IV Ntg, IV heparin, beta blocker. Consult CT surgery  for CABG.    2-D echocardiogram 07/03/16  Study Conclusions  - Left ventricle: The cavity size was normal. Wall thickness was   increased in a pattern of mild LVH. Systolic function was normal.   The estimated ejection fraction was in the range of 60% to 65%.   Wall motion was normal; there were no regional wall motion   abnormalities. Left ventricular diastolic function parameters   were normal. - Aortic valve: Mildly calcified annulus. There was trivial   regurgitation. - Aorta: Mild aortic root dilatation. Aortic root dimension: 40 mm   (ED). - Mitral valve: There was mild regurgitation.   Patient Profile     65 year old Caucasian male without prior cardiac history. He was on antihypertensive medications. He presented on 07/03/16 with burning sensation in his chest. He required catheterization by Dr. Swaziland revealing three-vessel disease with normal LV function. He  had coronary artery  bypass grafting 5 by Dr. Tyrone SageGerhardt on 07/04/16  with a LIMA to his LAD, vein graft to a diagonal branch, sequential vein graft to the first obtuse marginal branch and distal circumflex and the vein to the PDA. His postop course has remained uncooperative. He is ambulate during cardiac rehabilitation. He has mild volume overload.  Assessment & Plan    1: Non-STEMI-three-vessel CAD treated with bypass grafting 5 now post op day #5 ambulating without difficulty. Normal progression.  2: Hyperlipidemia-currently not on statin therapy. Questionable history of statin intolerance. We'll discuss further with patient.  3: Essential hypertension-blood pressure well controlled on metoprolol. He was on amlodipine and lisinopril prior to admission.  4: PAF-brief PAF postop on oral amiodarone currently in sinus rhythm  Patient progressed nicely postop day 5 coronary bypass grafting as having a non-STEMI. He is on appropriate medications. We'll review statin intolerance. Will see again as outpatient. Follow-up with Dr. Peter SwazilandJordan.  Alphonsus SiasSigned, Michol Emory, MD  07/09/2016, 10:53 AM

## 2016-07-10 LAB — GLUCOSE, CAPILLARY
Glucose-Capillary: 98 mg/dL (ref 65–99)
Glucose-Capillary: 89 mg/dL (ref 65–99)

## 2016-07-10 MED ORDER — FOLIC ACID 1 MG PO TABS: 1.0000 mg | ORAL_TABLET | Freq: Every day | ORAL | Status: DC

## 2016-07-10 MED ORDER — AMIODARONE HCL 200 MG PO TABS: 200.0000 mg | ORAL_TABLET | Freq: Two times a day (BID) | ORAL | 1 refills | Status: DC

## 2016-07-10 MED ORDER — POTASSIUM CHLORIDE CRYS ER 20 MEQ PO TBCR: 20.0000 meq | EXTENDED_RELEASE_TABLET | Freq: Every day | ORAL | 0 refills | Status: DC

## 2016-07-10 MED ORDER — METOPROLOL TARTRATE 25 MG PO TABS: 12.5000 mg | ORAL_TABLET | Freq: Two times a day (BID) | ORAL | 1 refills | Status: DC

## 2016-07-10 MED ORDER — FERROUS SULFATE 325 (65 FE) MG PO TABS: 325.0000 mg | ORAL_TABLET | Freq: Every day | ORAL | 3 refills | Status: DC

## 2016-07-10 MED ORDER — FUROSEMIDE 10 MG/ML IJ SOLN
40.0000 mg | Freq: Once | INTRAMUSCULAR | Status: AC
  Administered 2016-07-10: 40 mg via INTRAVENOUS
  Filled 2016-07-10: qty 4

## 2016-07-10 MED ORDER — ROSUVASTATIN CALCIUM 5 MG PO TABS: 5.0000 mg | ORAL_TABLET | Freq: Every day | ORAL | 1 refills | Status: DC

## 2016-07-10 MED ORDER — FUROSEMIDE 40 MG PO TABS: 40.0000 mg | ORAL_TABLET | Freq: Every day | ORAL | Status: DC

## 2016-07-10 MED ORDER — OXYCODONE HCL 5 MG PO TABS: ORAL_TABLET | ORAL | 0 refills | Status: DC

## 2016-07-10 MED ORDER — FUROSEMIDE 40 MG PO TABS: 40.0000 mg | ORAL_TABLET | Freq: Every day | ORAL | 0 refills | Status: DC
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
## 2016-07-10 NOTE — Care Management Note (Signed)
Case Management Note Donn PieriniKristi Chardae Mulkern RN, BSN Unit 2W-Case Manager [phone]  Patient Details  Name: Joseph Kidd M [place]aredez MRN: [id number] Date of Birth: 03/28/1951  Subjective/Objective:  Pt admitted with NSTEMI s/p CABGx5                  Action/Plan: PTA pt lived at home with wife- plan to return home- No CM needs noted for discharge.   Expected Discharge Date:  07/10/16               Expected Discharge Plan:  Home/Self Care  In-House Referral:     Discharge planning Services  CM Consult  Post Acute Care Choice:  NA Choice offered to:  NA  DME Arranged:    DME Agency:     HH Arranged:    HH Agency:     Status of Service:  Completed, signed off  If discussed at [employer] Length of Stay Meetings, dates discussed:    Additional Comments:  Darrold SpanWebster, Santiana Glidden Hall, RN 07/10/2016, 11:01 AM

## 2016-07-10 NOTE — Progress Notes (Addendum)
Patient in a stable condition, chest tube sutures removed as ordered, discharge education reviewd with patient and wife at bedside, they verbalised understanding, paper prescriptions given to patient, patient belongings at bedside, iv removed, tele dc ccmd notified, hospital volunteer called for transportation , patients wife to drive patient home

## 2016-07-10 NOTE — Progress Notes (Signed)
[id number] Education completed with pt and wife who voiced understanding. Reviewed sternal precautions, enc IS, and ex ed. Discussed heart healthy food choices. Pt did not want to view discharge video . Referring to [place] Phase 2.  Luetta NuttingCharlene Makeyla Govan RN BSN 07/10/2016 11:02 AM

## 2016-07-11 MED FILL — Dexmedetomidine HCl in NaCl 0.9% IV Soln 400 MCG/100ML: INTRAVENOUS | Qty: 100 | Status: AC

## 2016-07-13 ENCOUNTER — Telehealth: Payer: Self-pay | Admitting: Thoracic Surgery (Cardiothoracic Vascular Surgery)

## 2016-07-13 MED ORDER — BENZONATATE 100 MG PO CAPS: 100.0000 mg | ORAL_CAPSULE | Freq: Three times a day (TID) | ORAL | 0 refills | Status: DC | PRN

## 2016-07-13 NOTE — Telephone Encounter (Signed)
c/o constant cough and difficulty sleeping No fevers, cough nonproductive  On lisinopril- new  rec- hold lisinopril  Tessalon  Can try benadryl for sleep  Viviann SpareSteven C. Dorris FetchHendrickson, MD [facility] [phone]

## 2016-07-14 ENCOUNTER — Telehealth (HOSPITAL_COMMUNITY): Payer: Self-pay | Admitting: General Practice

## 2016-07-14 ENCOUNTER — Telehealth: Payer: Self-pay | Admitting: *Deleted

## 2016-07-14 NOTE — Telephone Encounter (Signed)
Patient insurance is Medicare A/B, insurance benefits verified. Medicare A/B - no co-payment, deductible $183/$174.55 has been met, no out of pocket, 20% co-insurance, no pre-authorization and no limit on visit. Passport/Reference # [id number]. Information given to [facility] for review.

## 2016-07-14 NOTE — Telephone Encounter (Signed)
Joseph Kidd had CABG on 07/04/16 and was discharged on 07/10/16. He developed wheezing and a cough. He called and talked with Dr. Dorris FetchHendrickson over rhe weekend. He told Dr. Dorris FetchHendrickson that he was discharged on Lisinopril, which he wasn't. Dr. Dorris FetchHendrickson told him to stop the Lisinopril and [facility]. He stopped the Metoprolol instead on his own and canceled the Tessalon. He did say that the wheezing and cough had pretty much subsided, but now he needed a bp replacement. I discussed this with Dr. Tyrone SageGerhardt and he said he should consult his cardiologist. I informed him of this and he agreed and will call him in the morning.

## 2016-07-14 NOTE — Telephone Encounter (Signed)
See previous note

## 2016-07-15 ENCOUNTER — Telehealth: Payer: Self-pay | Admitting: Physician Assistant

## 2016-07-15 NOTE — Telephone Encounter (Signed)
New message      Pt c/o medication issue:  1. Name of Medication: metoprolol tartrate 2. How are you currently taking this medication (dosage and times per day)? 25mg  3. Are you having a reaction (difficulty breathing--STAT)?  4. What is your medication issue? Pt what having wheezing episodes, called the on call doctor.  He advised pt to stop metoprolol.  Pt took last pill Sunday am.  Since stopping rx, wheezing is gone.  However, please call pt and let him know if he is to stop medication or restart it

## 2016-07-16 NOTE — Telephone Encounter (Signed)
Lm2cb 

## 2016-07-16 NOTE — Telephone Encounter (Signed)
Spoke with pt states that since he stopped metoprolol on Sunday the wheezing has stopped and just have a few coughing episodes a day. Pt will hold metoprolol until visit on 4-3 with Hao(per Wynema BirchHao)

## 2016-07-25 ENCOUNTER — Ambulatory Visit: Payer: Medicare Other | Admitting: Physician Assistant

## 2016-07-28 ENCOUNTER — Ambulatory Visit (INDEPENDENT_AMBULATORY_CARE_PROVIDER_SITE_OTHER): Payer: Medicare Other | Admitting: Physician Assistant

## 2016-07-28 ENCOUNTER — Encounter: Payer: Self-pay | Admitting: Physician Assistant

## 2016-07-28 VITALS — BP 116/70 | HR 80 | Ht 70.0 in | Wt 246.2 lb

## 2016-07-28 DIAGNOSIS — Z79899 Other long term (current) drug therapy: Secondary | ICD-10-CM

## 2016-07-28 DIAGNOSIS — E785 Hyperlipidemia, unspecified: Secondary | ICD-10-CM | POA: Diagnosis not present

## 2016-07-28 DIAGNOSIS — Z951 Presence of aortocoronary bypass graft: Secondary | ICD-10-CM

## 2016-07-28 DIAGNOSIS — I480 Paroxysmal atrial fibrillation: Secondary | ICD-10-CM

## 2016-07-28 DIAGNOSIS — R600 Localized edema: Secondary | ICD-10-CM

## 2016-07-28 DIAGNOSIS — I10 Essential (primary) hypertension: Secondary | ICD-10-CM | POA: Diagnosis not present

## 2016-07-28 DIAGNOSIS — I25810 Atherosclerosis of coronary artery bypass graft(s) without angina pectoris: Secondary | ICD-10-CM

## 2016-07-28 MED ORDER — DILTIAZEM HCL ER COATED BEADS 120 MG PO CP24: 120.0000 mg | ORAL_CAPSULE | Freq: Every day | ORAL | 3 refills | Status: DC

## 2016-07-28 NOTE — Progress Notes (Signed)
Cardiology Office Note    Date:  07/29/2016   ID:  Joseph Kidd, DOB 1951/03/24, MRN [id number]  PCP:  WHITE, Bonnell Public, NP  Cardiologist:  Dr. Swaziland  Chief Complaint  Patient presents with  . Follow-up    s/p CABG, seen for Dr. Swaziland    History of Present Illness:  Joseph Kidd is a 66 y.o. male with PMH of HTN, HLD intolerant to statins, OA and recently diagnosed CAD s/p CABG. He presented to the hospital with inferior STEMI on 07/03/2016. Emergent cardiac catheterization performed on 3/80/20 showed 95% proximal to mid RCA stenosis, 30% ostial to proximal RCA stenosis, 95% proximal LAD, 80% mid LAD, 100% occluded proximal left circumflex with left to left and right-to-left collaterals, 90% ostial OM1 lesion, EF 50-55%. Cardiothoracic surgery was consulted. Echocardiogram performed on the same day showed EF 60-65%, mild LVH, dilated aortic root up to 40 mm, mild MR. He underwent 5 vessel CABG with LIMA to LAD, SVG to diagonal, sequential SVG to OM1 and OM 3, SVG to RCA by Dr. Tyrone Sage on 07/04/2013. Post procedure, the hospital course involved volume overload requiring diuresis and also atrial fibrillation with RVR which converted on IV amiodarone. He was also initiated on Crestor as a trial to see if he would still have myalgia.  Due to persistent wheezing, he was instructed to stop the beta blocker. Prior to that he was also instructed to stop the lisinopril. His symptom has significantly improved since stopping the beta blocker. He says he was taking lisinopril for several years without any issue. It was only after he went home with the beta blocker, he started having wheezing. He still have occasional cough as well. Otherwise, he denies any obvious chest discomfort. He is quite adamant, he does not want to continue amiodarone by this point. Given the fact that he had atrial fibrillation with RVR in the setting of acute distress postop, I think it is reasonable to stop the amiodarone at  point especially given its long half-life. I would continue to avoid the entire beta blocker family in this patient just in case he was having bronchospasm. It is very unusual to have bronchospasm on metoprolol tartrate given it is a very selective beta blocker. An EKG today, he seems to be in bigeminy. Otherwise she denies any obvious chest pain. He is having significant right lower extremity swelling. He has very tight skin turgor in the right lower extremity where the vein was harvested. The degree of swelling in the right lower extremity is more than what I expected. I recommended venous Doppler to make sure there is no DVT.    Past Medical History:  Diagnosis Date  . Hypercholesterolemia   . Hypertension   . Osteoarthritis     Past Surgical History:  Procedure Laterality Date  . CORONARY ARTERY BYPASS GRAFT N/A 07/04/2016   Procedure: CORONARY ARTERY BYPASS GRAFT times five with left internal mammary artery to Left Anterior Descending artery : endoscopic harvest of right saphenous vein with grafts to Diagonal, Right Coronary, Obtuse Marginal 1, Obtuse Marginal 3 Arteries. Insertion of right groin femoral a line;  Surgeon: Delight Ovens, MD;  Location: [facility] OR;  Service: Open Heart Surgery;  Laterality: N/A;  . INTRAOPERATIVE TRANSESOPHAGEAL ECHOCARDIOGRAM N/A 07/04/2016   Procedure: INTRAOPERATIVE TRANSESOPHAGEAL ECHOCARDIOGRAM;  Surgeon: Delight Ovens, MD;  Location: [facility] OR;  Service: Open Heart Surgery;  Laterality: N/A;  . LEFT HEART CATH AND CORONARY ANGIOGRAPHY N/A 07/03/2016   Procedure: Left  Heart Cath and Coronary Angiography;  Surgeon: Peter M Swaziland, MD;  Location: [facility] INVASIVE CV LAB;  Service: Cardiovascular;  Laterality: N/A;    Current Medications: Outpatient Medications Prior to Visit  Medication Sig Dispense Refill  . aspirin EC 81 MG tablet Take 81 mg by mouth daily.    . ferrous sulfate 325 (65 FE) MG tablet Take 1 tablet (325 mg total) by mouth daily with breakfast. For  one month then stop.  3  . folic acid (FOLVITE) 1 MG tablet Take 1 tablet (1 mg total) by mouth daily. For one month then stop.    . rosuvastatin (CRESTOR) 5 MG tablet Take 1 tablet (5 mg total) by mouth daily. 30 tablet 1  . amiodarone (PACERONE) 200 MG tablet Take 1 tablet (200 mg total) by mouth 2 (two) times daily. For one week;then take Amiodarone 200 mg by mouth daily thereafter (Patient not taking: Reported on 07/28/2016) 60 tablet 1  . benzonatate (TESSALON PERLES) 100 MG capsule Take 1 capsule (100 mg total) by mouth 3 (three) times daily as needed for cough. (Patient not taking: Reported on 07/28/2016) 20 capsule 0  . furosemide (LASIX) 40 MG tablet Take 1 tablet (40 mg total) by mouth daily. For one week then stop. (Patient not taking: Reported on 07/28/2016) 7 tablet 0  . metoprolol tartrate (LOPRESSOR) 25 MG tablet Take 0.5 tablets (12.5 mg total) by mouth 2 (two) times daily. (Patient not taking: Reported on 07/28/2016) 30 tablet 1  . oxyCODONE (OXY IR/ROXICODONE) 5 MG immediate release tablet Take 5 mg by mouth every 4-6 hours PRN severe pain (Patient not taking: Reported on 07/28/2016) 30 tablet 0  . potassium chloride SA (K-DUR,KLOR-CON) 20 MEQ tablet Take 1 tablet (20 mEq total) by mouth daily. For one week then stop. (Patient not taking: Reported on 07/28/2016) 7 tablet 0   No facility-administered medications prior to visit.      Allergies:   Atorvastatin and Chlorhexidine   Social History   Social History  . Marital status: Married    Spouse name: N/A  . Number of children: 1  . Years of education: N/A   Occupational History  . retired Copywriter, advertising    Social History Main Topics  . Smoking status: Never Smoker  . Smokeless tobacco: Never Used  . Alcohol use No  . Drug use: No  . Sexual activity: Not Asked   Other Topics Concern  . None   Social History Narrative  . None     Family History:  The patient's family history includes Heart disease in his mother;  Hypercholesterolemia in his mother.   ROS:   Please see the history of present illness.    ROS All other systems reviewed and are negative.   PHYSICAL EXAM:   VS:  BP 116/70   Pulse 80   Ht  (1.778 m)   Wt 246 lb 3.2 oz (111.7 kg)   BMI 35.33 kg/m    GEN: Well nourished, well developed, in no acute distress  HEENT: normal  Neck: no JVD, carotid bruits, or masses Cardiac: RRR; no murmurs, rubs, or gallops. RLE edema  Respiratory:  clear to auscultation bilaterally, normal work of breathing GI: soft, nontender, nondistended, + BS MS: no deformity or atrophy  Skin: warm and dry, no rash Neuro:  Alert and Oriented x 3, Strength and sensation are intact Psych: euthymic mood, full affect  Wt Readings from Last 3 Encounters:  07/28/16 246 lb 3.2 oz (111.7 kg)  07/10/16 271 lb 3.2 oz (123 kg)      Studies/Labs Reviewed:   EKG:  EKG is ordered today.  The ekg ordered today demonstrates bigeminy with HR 80  Recent Labs: 07/05/2016: Magnesium 2.4 07/07/2016: ALT 28 07/09/2016: BUN 28; Creatinine, Ser 0.97; Hemoglobin 7.5; Platelets 225; Potassium 3.9; Sodium 136   Lipid Panel    Component Value Date/Time   CHOL 248 (H) 07/04/2016 0436   CHOL 241 (H) 07/04/2016 0436   TRIG 129 07/04/2016 0436   TRIG 141 07/04/2016 0436   HDL 23 (L) 07/04/2016 0436   HDL 23 (L) 07/04/2016 0436   CHOLHDL 10.8 07/04/2016 0436   CHOLHDL 10.5 07/04/2016 0436   VLDL 26 07/04/2016 0436   VLDL 28 07/04/2016 0436   LDLCALC 199 (H) 07/04/2016 0436   LDLCALC 190 (H) 07/04/2016 0436    Additional studies/ records that were reviewed today include:   Cath 07/03/2016 Conclusion     Prox RCA to Mid RCA lesion, 95 %stenosed.  Ost RCA to Prox RCA lesion, 30 %stenosed.  Mid RCA lesion, 40 %stenosed.  Prox LAD lesion, 95 %stenosed.  Mid LAD-2 lesion, 75 %stenosed.  Mid LAD-1 lesion, 80 %stenosed.  Ost 1st Mrg to 1st Mrg lesion, 90 %stenosed.  Prox Cx to Mid Cx lesion, 100  %stenosed.  The left ventricular systolic function is normal.  LV end diastolic pressure is normal.  The left ventricular ejection fraction is 50-55% by visual estimate.   1. Critical 3 vessel obstructive CAD.     - complex 95% proximal LAD, 75-80% diffuse mid LAD    - 90% OM1    - 100% mid LCx with left to left and right to left collaterals    - 95% proximal RCA 2. Good LV function 3. Normal LVEDP  Plan: Patient is currently pain free- will aggressively threat medically with ASA, IV Ntg, IV heparin, beta blocker. Consult CT surgery  for CABG.      Echo 07/03/2016 LV EF: 60% -   65%  ------------------------------------------------------------------- Indications:      Chest pain 786.51.  ------------------------------------------------------------------- History:   Risk factors:  NSTEMI. Hypertension. Dyslipidemia.  ------------------------------------------------------------------- Study Conclusions  - Left ventricle: The cavity size was normal. Wall thickness was   increased in a pattern of mild LVH. Systolic function was normal.   The estimated ejection fraction was in the range of 60% to 65%.   Wall motion was normal; there were no regional wall motion   abnormalities. Left ventricular diastolic function parameters   were normal. - Aortic valve: Mildly calcified annulus. There was trivial   regurgitation. - Aorta: Mild aortic root dilatation. Aortic root dimension: 40 mm   (ED). - Mitral valve: There was mild regurgitation.    CABG x 5 by Dr. Tyrone Sage 07/04/2016 PRE-OPERATIVE DIAGNOSIS:  1. S/p NSTEMI 2. Coronary artery disease  POST-OPERATIVE DIAGNOSIS:  1. S/p NSTEMI 2. Coronary artery disease  PROCEDURE: INTRAOPERATIVE TRANSESOPHAGEAL ECHOCARDIOGRAM, MEDIAN STERNOTOMY for CORONARY ARTERY BYPASS GRAFT x 5 (LIMA to LAD, SVG to DIAGONAL, SVG SEQUENTIALLY to OM1 and OM3, and SVG to RCA) with  endoscopic harvest of right greater saphenous vein with grafts to     ASSESSMENT:    1. Edema of right lower extremity   2. S/P CABG x 5   3. Dyslipidemia   4. Medication management   5. Coronary artery disease involving coronary bypass graft of native heart without angina pectoris   6. PAF (paroxysmal atrial fibrillation) (HCC)   7. Essential hypertension  PLAN:  In order of problems listed above:  1. CAD s/p CABG x 5: Denies any angina since CABG, midline sternal wound is well-healed.  - He attributed his recent wheezing to metoprolol tartrate as he was previously on lisinopril for several years prior to the bypass surgery. It is unusual that he would have shortness of breath and wheezing on beta blocker as metoprolol tartrate is very selective and unlikely to cause bronchospasm.  - I will avoid beta blocker for now.  2. Post op PAF: Had A. fib with RVR after bypass surgery, he was treated with amiodarone with plan to continue amiodarone for one month. He is having was sleeping and also significant nausea after taking amiodarone. I think he is insomnia may be related to the fact he had major surgery and not necessarily amiodarone. However the nausea does sound like amiodarone side effect. He is quite adamant he wants to be off amiodarone, I think it is fine at this point since he is several weeks out from the surgery and amiodarone will continue to stay in his body for several weeks after being off due to its long half-life  - I did recommend a rate control medication, unfortunately he cannot take the beta blocker due to recent wheezing. I will start on diltiazem CD 120 mg daily.  3. RLE edema: He had right lower extremity vein harvesting. There is a single wound in the right lower extremity that had poor healing, will need to continue to observe to make sure this area does not get infected. Right lower extremity looks a lot bigger than the left side and has very tight skin turgor. This is more than expected for the swelling after vein harvesting. I  will obtain venous Doppler to make sure there is no sign of DVT. He also has some bruising in the right lower extremity as well. Other than this ipsilateral swelling, he does not appears to be fluid overloaded on physical exam. He had finished a one week course of Lasix after discharge.  4. HTN: Blood pressure stable  5. HLD: Intolerant to Lipitor as before, he was started on Crestor 5 mg daily.    Medication Adjustments/Labs and Tests Ordered: Current medicines are reviewed at length with the patient today.  Concerns regarding medicines are outlined above.  Medication changes, Labs and Tests ordered today are listed in the Patient Instructions below. Patient Instructions  Medication Instructions: Azalee Course, PA has recommended making the following medication changes: 1. STOP Amiodarone 2. STOP Metoprolol 3. START Diltiazem CD 120 mg - take 1 tablet by mouth daily  Labwork: Your physician recommends that you return for lab work in 6 weeks - FASTING.  Testing/Procedures: 1. Lower Extremity Venous Doppler, Right - Your physician has requested that you have a lower or upper extremity venous duplex. This test is an ultrasound of the veins in the legs or arms. It looks at venous blood flow that carries blood from the heart to the legs or arms. Allow one hour for a Lower Venous exam. Allow thirty minutes for an Upper Venous exam. There are no restrictions or special instructions.  Follow-up: Wynema Birch recommends that you schedule a follow-up appointment in 2-3 months with Dr Swaziland.  If you need a refill on your cardiac medications before your next appointment, please call your pharmacy.    Ramond Dial, [place]  07/29/2016 6:29 AM    [facility] [address] [place], [place], [place]  [address] Phone: [phone];  Fax: [phone]

## 2016-07-28 NOTE — Patient Instructions (Signed)
Medication Instructions: Azalee Course, PA has recommended making the following medication changes: 1. STOP Amiodarone 2. STOP Metoprolol 3. START Diltiazem CD 120 mg - take 1 tablet by mouth daily  Labwork: Your physician recommends that you return for lab work in 6 weeks - FASTING.  Testing/Procedures: 1. Lower Extremity Venous Doppler, Right - Your physician has requested that you have a lower or upper extremity venous duplex. This test is an ultrasound of the veins in the legs or arms. It looks at venous blood flow that carries blood from the heart to the legs or arms. Allow one hour for a Lower Venous exam. Allow thirty minutes for an Upper Venous exam. There are no restrictions or special instructions.  Follow-up: Wynema Birch recommends that you schedule a follow-up appointment in 2-3 months with Dr Swaziland.  If you need a refill on your cardiac medications before your next appointment, please call your pharmacy.

## 2016-07-29 ENCOUNTER — Ambulatory Visit (INDEPENDENT_AMBULATORY_CARE_PROVIDER_SITE_OTHER): Payer: Medicare Other | Admitting: Cardiovascular Disease

## 2016-07-29 ENCOUNTER — Ambulatory Visit (HOSPITAL_COMMUNITY)
Admission: RE | Admit: 2016-07-29 | Discharge: 2016-07-29 | Disposition: A | Discharge status: Home / Self Care | Payer: Medicare Other | Source: Ambulatory Visit | Attending: Cardiovascular Disease | Admitting: Cardiovascular Disease

## 2016-07-29 ENCOUNTER — Encounter: Payer: Self-pay | Admitting: Cardiovascular Disease

## 2016-07-29 ENCOUNTER — Encounter: Payer: Self-pay | Admitting: Physician Assistant

## 2016-07-29 DIAGNOSIS — I25810 Atherosclerosis of coronary artery bypass graft(s) without angina pectoris: Secondary | ICD-10-CM | POA: Diagnosis not present

## 2016-07-29 DIAGNOSIS — R600 Localized edema: Secondary | ICD-10-CM | POA: Diagnosis not present

## 2016-07-29 DIAGNOSIS — I82491 Acute embolism and thrombosis of other specified deep vein of right lower extremity: Secondary | ICD-10-CM | POA: Diagnosis not present

## 2016-07-29 DIAGNOSIS — Z951 Presence of aortocoronary bypass graft: Secondary | ICD-10-CM

## 2016-07-29 DIAGNOSIS — E7800 Pure hypercholesterolemia, unspecified: Secondary | ICD-10-CM | POA: Diagnosis not present

## 2016-07-29 DIAGNOSIS — I2510 Atherosclerotic heart disease of native coronary artery without angina pectoris: Secondary | ICD-10-CM | POA: Insufficient documentation

## 2016-07-29 DIAGNOSIS — E785 Hyperlipidemia, unspecified: Secondary | ICD-10-CM | POA: Insufficient documentation

## 2016-07-29 DIAGNOSIS — I10 Essential (primary) hypertension: Secondary | ICD-10-CM

## 2016-07-29 DIAGNOSIS — I824Z1 Acute embolism and thrombosis of unspecified deep veins of right distal lower extremity: Secondary | ICD-10-CM | POA: Insufficient documentation

## 2016-07-29 LAB — COMPREHENSIVE METABOLIC PANEL
ALK PHOS: 134 U/L — AB (ref 40–115)
ALT: 29 U/L (ref 9–46)
AST: 18 U/L (ref 10–35)
Albumin: 3.5 g/dL — ABNORMAL LOW (ref 3.6–5.1)
BILIRUBIN TOTAL: 0.5 mg/dL (ref 0.2–1.2)
BUN: 14 mg/dL (ref 7–25)
CALCIUM: 9 mg/dL (ref 8.6–10.3)
CO2: 24 mmol/L (ref 20–31)
Chloride: 105 mmol/L (ref 98–110)
Creat: 1.04 mg/dL (ref 0.70–1.25)
Glucose, Bld: 93 mg/dL (ref 65–99)
Potassium: 4.5 mmol/L (ref 3.5–5.3)
Sodium: 139 mmol/L (ref 135–146)
TOTAL PROTEIN: 7.1 g/dL (ref 6.1–8.1)

## 2016-07-29 LAB — CBC
HCT: 36.7 % — ABNORMAL LOW (ref 38.5–50.0)
HEMOGLOBIN: 11.7 g/dL — AB (ref 13.2–17.1)
MCH: 29 pg (ref 27.0–33.0)
MCHC: 31.9 g/dL — ABNORMAL LOW (ref 32.0–36.0)
MCV: 91.1 fL (ref 80.0–100.0)
MPV: 9 fL (ref 7.5–12.5)
Platelets: 359 10*3/uL (ref 140–400)
RBC: 4.03 MIL/uL — AB (ref 4.20–5.80)
RDW: 15.5 % — ABNORMAL HIGH (ref 11.0–15.0)
WBC: 5.6 10*3/uL (ref 3.8–10.8)

## 2016-07-29 LAB — ECHO TEE: Mean grad: 1 mmHg

## 2016-07-29 MED ORDER — RIVAROXABAN 15 MG PO TABS: 15.0000 mg | ORAL_TABLET | Freq: Two times a day (BID) | ORAL | 0 refills | Status: DC

## 2016-07-29 MED ORDER — HYDROCHLOROTHIAZIDE 12.5 MG PO CAPS: ORAL_CAPSULE | ORAL | 3 refills | Status: DC

## 2016-07-29 MED ORDER — RIVAROXABAN 20 MG PO TABS: 20.0000 mg | ORAL_TABLET | Freq: Every day | ORAL | 6 refills | Status: DC

## 2016-07-29 MED ORDER — ROSUVASTATIN CALCIUM 10 MG PO TABS: 10.0000 mg | ORAL_TABLET | Freq: Every day | ORAL | 6 refills | Status: DC

## 2016-07-29 NOTE — Anesthesia Postprocedure Evaluation (Addendum)
Anesthesia Post Note  Patient: Joseph Kidd  Procedure(s) Performed: Procedure(s) (LRB): CORONARY ARTERY BYPASS GRAFT times five with left internal mammary artery to Left Anterior Descending artery : endoscopic harvest of right saphenous vein with grafts to Diagonal, Right Coronary, Obtuse Marginal 1, Obtuse Marginal 3 Arteries. Insertion of right groin femoral a line (N/A) INTRAOPERATIVE TRANSESOPHAGEAL ECHOCARDIOGRAM (N/A)  Patient location during evaluation: ICU Anesthesia Type: General Level of consciousness: sedated Pain management: pain level controlled Vital Signs Assessment: post-procedure vital signs reviewed and stable Respiratory status: patient remains intubated per anesthesia plan Cardiovascular status: stable Anesthetic complications: no       Last Vitals:  Vitals:   07/10/16 0449 07/10/16 0822  BP: 115/67 125/86  Pulse: 71 75  Resp: 18 18  Temp: 36.8 C     Last Pain:  Vitals:   07/10/16 0449  TempSrc: Oral  PainSc:                  Joseph Kidd

## 2016-07-29 NOTE — Patient Instructions (Addendum)
Your physician has recommended you make the following change in your medication:   1.) the rosuvastatin has been increased from 5 mg to 10 mg daily.  2.) start fluid pill ( HCTZ) as directed.  3.) start xarelto as directed by the clinical pharmacist.  Your physician recommends that you return for lab work today.  Your physician recommends that you schedule a follow-up appointment in: 4 weeks with Dr Swaziland.

## 2016-07-30 ENCOUNTER — Telehealth: Payer: Self-pay | Admitting: *Deleted

## 2016-07-30 ENCOUNTER — Telehealth: Payer: Self-pay | Admitting: Cardiovascular Disease

## 2016-07-30 MED ORDER — RIVAROXABAN 15 MG PO TABS: 15.0000 mg | ORAL_TABLET | Freq: Two times a day (BID) | ORAL | 0 refills | Status: DC

## 2016-07-30 NOTE — Telephone Encounter (Signed)
PATIENT STATES HE CAN NOT SUSTAIN PAYING FOR XARELTO IF  IT WILL COST $240  A MONTH .PATIENT STATES HE DOSE HAVE MEDICARE PART D AND SUPPLEMENT .    PATIENT STATES FOF $14 PILLS OF  XARELTO OVER $200  AND THEN TO PURCHASE XARELTO 20     . PATIENT RECEIVED FREE 30 DAY  SAVING CARD . RN SPOKE TO RAQUEL , PHARMACIST   PATIENT WILL COME TO OFFICE AN PICK UP SAMPLES FOR 1 WEEK  QUANTITY OF XARELTO 14 TABLETS . USE THE SAVINGS CARD FOR 30 TABLETS OF XARELTO 20 MG. KEEP APPOINTMENT WITH DR Swaziland- DISCUSS HOW LONG WILL BE ON MEDICATION. IF NEED MAY NEED TO HAVE PATIENT TO CONTACT TO GET ASSISTANCE FOR  PHARMACEUTICAL COMPANY  PATIENT VERBALIZED UNDERSTANDING.

## 2016-07-30 NOTE — Telephone Encounter (Signed)
-----   Message from Lennette Bihari, MD sent at 07/30/2016  8:10 AM EDT ----- Renal function stable; anemia significantly improved one month post CABG

## 2016-07-30 NOTE — Telephone Encounter (Signed)
New message   Pt c/o medication issue:  1. Name of Medication: xarelto  2. How are you currently taking this medication (dosage and times per day)?   3. Are you having a reaction (difficulty breathing--STAT)?   4. What is your medication issue? Pt went to pick up medication and it was too expensive. He said if it's just going to be what he started on, he could do it but he can not continue to pay this.

## 2016-07-30 NOTE — Telephone Encounter (Signed)
Patient notified of results.

## 2016-07-31 NOTE — Telephone Encounter (Signed)
Spoke to patient 4 week follow up visit with Dr.Jordan scheduled 08/26/16 at 11:40 am.He will discuss continuing to take xarelto at visit.

## 2016-07-31 NOTE — Progress Notes (Signed)
Cardiology Office Note    Date:  07/31/2016   ID:  Joseph Kidd, DOB 1950/12/26, MRN [id number]  PCP:  WHITE, Delmar Landau, NP  Cardiologist:  Shelva Majestic, MD   Chief Complaint  Patient presents with  . Follow-up    work in positive DVT    History of Present Illness:  Joseph Kidd is a 66 y.o. male 's being seen as an add-on following a Doppler study today demonstrating a lower extremity DVT.  Mr. Joseph Kidd is a 66 year old gentleman who has a history of hypertension, hyperlipidemia, reported statin intolerance, obstructive sleep apnea, previously underwent BG revascularization surgery was 5 on 07/04/2016 by Dr. Servando Snare.  Postoperatively, he developed atrial fibrillation with RVR and converted to sinus rhythm with amiodarone.  He developed wheezing on beta blocker therapy.  Seen in the office yesterday by Joseph Kidd with complaints of again began right lower Schembri swelling.  His skin turgor was very tight in the right lower extremity where the vein was harvested.  He was referred for a lower extremity venous Doppler, which he had done today.  A Doppler study was positive for right lower extremity, nonocclusive, venous thrombus in one of the paired peroneal veins.  He is now seen as an add-on for further evaluation and treatment.  He denies shortness of breath or chest pain.  He denies any palpitations or awareness of recurrent atrial fibrillation.  He has been on aspirin 81 mg, diltiazem 120 mg CD and rosuvastatin 5 mg.  Laboratory postop on 07/09/16 was notable for hemoglobin 7.5 and hematocrit 22.9.  BUN 28, creatinine 0.97.    Past Medical History:  Diagnosis Date  . Hypercholesterolemia   . Hypertension   . Osteoarthritis     Past Surgical History:  Procedure Laterality Date  . CORONARY ARTERY BYPASS GRAFT N/A 07/04/2016   Procedure: CORONARY ARTERY BYPASS GRAFT times five with left internal mammary artery to Left Anterior Descending artery : endoscopic harvest of right  saphenous vein with grafts to Diagonal, Right Coronary, Obtuse Marginal 1, Obtuse Marginal 3 Arteries. Insertion of right groin femoral a line;  Surgeon: Grace Isaac, MD;  Location: [facility];  Service: Open Heart Surgery;  Laterality: N/A;  . INTRAOPERATIVE TRANSESOPHAGEAL ECHOCARDIOGRAM N/A 07/04/2016   Procedure: INTRAOPERATIVE TRANSESOPHAGEAL ECHOCARDIOGRAM;  Surgeon: Grace Isaac, MD;  Location: [facility];  Service: Open Heart Surgery;  Laterality: N/A;  . LEFT HEART CATH AND CORONARY ANGIOGRAPHY N/A 07/03/2016   Procedure: Left Heart Cath and Coronary Angiography;  Surgeon: Peter M Martinique, MD;  Location: [facility];  Service: Cardiovascular;  Laterality: N/A;    Current Medications: Outpatient Medications Prior to Visit  Medication Sig Dispense Refill  . aspirin EC 81 MG tablet Take 81 mg by mouth daily.    Marland Kitchen diltiazem (CARDIZEM CD) 120 MG 24 hr capsule Take 1 capsule (120 mg total) by mouth daily. 90 capsule 3  . ferrous sulfate 325 (65 FE) MG tablet Take 1 tablet (325 mg total) by mouth daily with breakfast. For one month then stop.  3  . folic acid (FOLVITE) 1 MG tablet Take 1 tablet (1 mg total) by mouth daily. For one month then stop.    . rosuvastatin (CRESTOR) 5 MG tablet Take 1 tablet (5 mg total) by mouth daily. 30 tablet 1   No facility-administered medications prior to visit.      Allergies:   Atorvastatin and Chlorhexidine   Social History   Social History  . Marital  status: Married    Spouse name: N/A  . Number of children: 1  . Years of education: N/A   Occupational History  . retired Clinical cytogeneticist    Social History Main Topics  . Smoking status: Never Smoker  . Smokeless tobacco: Never Used  . Alcohol use No  . Drug use: No  . Sexual activity: Not Asked   Other Topics Concern  . None   Social History Narrative  . None     Family History:  The patient's  family history includes Heart disease in his mother; Hypercholesterolemia in his mother.    ROS General: Negative; No fevers, chills, or night sweats;  HEENT: Negative; No changes in vision or hearing, sinus congestion, difficulty swallowing Pulmonary: Negative; No cough, wheezing, shortness of breath, hemoptysis Cardiovascular: Negative; No chest pain, presyncope, syncope, palpitations Positive for leg swelling, right greater than left. GI: Negative; No nausea, vomiting, diarrhea, or abdominal pain GU: Negative; No dysuria, hematuria, or difficulty voiding Musculoskeletal: Negative; no myalgias, joint pain, or weakness Hematologic/Oncology: Negative; no easy bruising, bleeding Endocrine: Negative; no heat/cold intolerance; no diabetes Neuro: Negative; no changes in balance, headaches Skin: Negative; No rashes or skin lesions Psychiatric: Negative; No behavioral problems, depression Sleep: Negative; No snoring, daytime sleepiness, hypersomnolence, bruxism, restless legs, hypnogognic hallucinations, no cataplexy Other comprehensive 14 point system review is negative.   PHYSICAL EXAM:   VS:  BP (!) 146/82   Pulse 74   Ht _0  (1.778 m)   Wt 247 lb (112 kg)   BMI 35.44 kg/m     The blood pressure by me 140/84 Wt Readings from Last 3 Encounters:  07/29/16 247 lb (112 kg)  07/28/16 246 lb 3.2 oz (111.7 kg)  07/10/16 271 lb 3.2 oz (123 kg)    General: Alert, oriented, no distress.  Skin: normal turgor, no rashes, warm and dry HEENT: Normocephalic, atraumatic. Pupils equal round and reactive to light; sclera anicteric; extraocular muscles intact; Fundi no ridges or exudates. Nose without nasal septal hypertrophy Mouth/Parynx benign; Mallinpatti scale 3 Neck: No JVD, no carotid bruits; normal carotid upstroke Lungs: clear to ausculatation and percussion; no wheezing or rales Chest wall: without tenderness to palpitation Heart: PMI not displaced, RRR, s1 s2 normal, 1/6 systolic murmur, no diastolic murmur, no rubs, gallops, thrills, or heaves Abdomen: soft, nontender;  no hepatosplenomehaly, BS+; abdominal aorta nontender and not dilated by palpation. Back: no CVA tenderness Pulses 2+ Musculoskeletal: full range of motion, normal strength, no joint deformities Extremities: Right lower extremity edema which according to the patient is less tense than yesterday.  There is ecchymosis the right upper thigh region.  Homan's sign negative  Neurologic: grossly nonfocal; Cranial nerves grossly wnl Psychologic: Normal mood and affect   Studies/Labs Reviewed:   EKG:  EKG is not ordered today.  However, I personally reviewed the ECG done yesterday on 07/28/2016, which shows a bigeminal rhythm with a ventricular rate at 80.  Recent Labs: BMP Latest Ref Rng & Units 07/29/2016 07/09/2016 07/08/2016  Glucose 65 - 99 mg/dL 93 108(H) 108(H)  BUN 7 - 25 mg/dL 14 28(H) 28(H)  Creatinine 0.70 - 1.25 mg/dL 1.04 0.97 0.79  Sodium 135 - 146 mmol/L 139 136 138  Potassium 3.5 - 5.3 mmol/L 4.5 3.9 4.0  Chloride 98 - 110 mmol/L 105 103 106  CO2 20 - 31 mmol/L _1 Calcium 8.6 - 10.3 mg/dL 9.0 7.8(L) 7.9(L)     Hepatic Function Latest Ref Rng & Units 07/29/2016 07/07/2016 07/04/2016  Total Protein 6.1 - 8.1 g/dL 7.1 4.9(L) 6.9  Albumin 3.6 - 5.1 g/dL 3.5(L) 2.6(L) 3.7  AST 10 - 35 U/L 18 49(H) 110(H)  ALT 9 - 46 U/L 29 28 41  Alk Phosphatase 40 - 115 U/L 134(H) 38 55  Total Bilirubin 0.2 - 1.2 mg/dL 0.5 0.5 0.7    CBC Latest Ref Rng & Units 07/29/2016 07/09/2016 07/08/2016  WBC 3.8 - 10.8 K/uL 5.6 8.2 8.4  Hemoglobin 13.2 - 17.1 g/dL 11.7(L) 7.5(L) 7.4(L)  Hematocrit 38.5 - 50.0 % 36.7(L) 22.9(L) 22.1(L)  Platelets 140 - 400 K/uL 359 225 142(L)   Lab Results  Component Value Date   MCV 91.1 07/29/2016   MCV 88.4 07/09/2016   MCV 89.1 07/08/2016   No results found for: TSH Lab Results  Component Value Date   HGBA1C 5.5 07/03/2016     BNP No results found for: BNP  ProBNP No results found for: PROBNP   Lipid Panel     Component Value Date/Time   CHOL 248  (H) 07/04/2016 0436   CHOL 241 (H) 07/04/2016 0436   TRIG 129 07/04/2016 0436   TRIG 141 07/04/2016 0436   HDL 23 (L) 07/04/2016 0436   HDL 23 (L) 07/04/2016 0436   CHOLHDL 10.8 07/04/2016 0436   CHOLHDL 10.5 07/04/2016 0436   VLDL 26 07/04/2016 0436   VLDL 28 07/04/2016 0436   LDLCALC 199 (H) 07/04/2016 0436   LDLCALC 190 (H) 07/04/2016 0436     RADIOLOGY: Dg Chest 2 View  Result Date: 07/08/2016 CLINICAL DATA:  CABG. EXAM: CHEST  2 VIEW COMPARISON:  07/07/2016. FINDINGS: Right IJ sheath in stable position. Prior CABG. Cardiomegaly. No pulmonary venous congestion. Interim partial clearing of basilar infiltrates/edema. Low lung volumes with mild basilar atelectasis. No pneumothorax . IMPRESSION: 1. Right IJ sheath in stable position. 2. Prior CABG. Stable cardiomegaly. Interim partial clearing of bilateral pulmonary infiltrates/ edema. Small left pleural effusion. 3. Low lung volumes with basilar atelectasis. Electronically Signed   By: Marcello Moores  Register   On: 07/08/2016 07:24   Dg Chest Port 1 View  Result Date: 07/07/2016 CLINICAL DATA:  Atelectasis. EXAM: PORTABLE CHEST 1 VIEW COMPARISON:  07/06/2016 . FINDINGS: Right IJ sheath in stable position. Prior CABG. Cardiomegaly with bilateral pulmonary infiltrates consistent with pulmonary edema. Bilateral pneumonia cannot be excluded. Low lung volumes with basilar atelectasis . Small left pleural effusion. No pneumothorax. IMPRESSION: 1.  Right IJ sheath in stable position. 2. Prior CABG. Cardiomegaly with bilateral pulmonary infiltrates most consistent with pulmonary edema. Bilateral pneumonia cannot be excluded. Small left pleural effusion. 3. Low lung volumes with basilar atelectasis. Electronically Signed   By: Marcello Moores  Register   On: 07/07/2016 07:39   Dg Chest Port 1 View  Result Date: 07/06/2016 CLINICAL DATA:  Atelectasis EXAM: PORTABLE CHEST 1 VIEW COMPARISON:  07/05/2016 FINDINGS: Swan-Ganz catheter has been removed in the interval.  Postsurgical changes are again seen. Left-sided thoracostomy catheter remains in place without pneumothorax. Left basilar atelectasis is noted. The right lung is clear. IMPRESSION: Left basilar atelectasis. No pneumothorax noted. Electronically Signed   By: Inez Catalina M.D.   On: 07/06/2016 08:24   Dg Chest Port 1 View  Result Date: 07/05/2016 CLINICAL DATA:  Status post CABG. EXAM: PORTABLE CHEST 1 VIEW COMPARISON:  Chest x-rays dated 07/04/2016 and 07/03/2016. FINDINGS: Endotracheal tube and enteric tube have been removed. Swan-Ganz catheter is stable in position with tip to the left of midline. Left-sided chest tube is stable in position. Heart  size and mediastinal contours appear stable. Mild central pulmonary vascular congestion. Probable atelectasis and small pleural effusion at the left lung base. No pneumothorax. IMPRESSION: 1. Mild central pulmonary vascular congestion without overt alveolar pulmonary edema. Probable mild atelectasis and small effusion at the left lung base. 2. Interval removal of the endotracheal and enteric tubes. Remainder of the support apparatus remains appropriately positioned. Electronically Signed   By: Franki Cabot M.D.   On: 07/05/2016 09:09   Dg Chest Port 1 View  Result Date: 07/04/2016 CLINICAL DATA:  Status post CABG. EXAM: PORTABLE CHEST 1 VIEW COMPARISON:  Chest radiograph July 03, 2016 FINDINGS: Endotracheal tube tip projects at the carina, though difficult to visualize. Swan-Ganz catheter via RIGHT internal jugular venous approach with distal tip projecting at main pulmonary artery. Nasogastric tube tip projects at GE junction, side port in the proximal stomach. LEFT chest tube with side port projecting within the chest wall. Cardiac silhouette is mildly enlarged. Low lung volumes without pleural effusion or focal consolidation. No pneumothorax. Status post median sternotomy for CABG. IMPRESSION: Endotracheal tube tip projects at the carina, recommend 1-2 cm of  retraction. Additional well-positioned life-support lines. Stable cardiomegaly, no acute pulmonary process. Electronically Signed   By: Elon Alas M.D.   On: 07/04/2016 15:20   Dg Chest Port 1 View  Result Date: 07/03/2016 CLINICAL DATA:  Chest pain today, pre-op image before am surgery. Hx of HTN. EXAM: PORTABLE CHEST - 1 VIEW COMPARISON:  none FINDINGS: Monitoring leads overlie the patient.  Lungs clear. Heart size and mediastinal contours are within normal limits. No effusion.  No pneumothorax. Visualized bones unremarkable. IMPRESSION: No acute cardiopulmonary disease. Electronically Signed   By: Lucrezia Europe M.D.   On: 07/03/2016 19:14     Additional studies/ records that were reviewed today include:  Reviewed the patient's hospital records and note from yesterday.  I reviewed the lower extremity Doppler study    ASSESSMENT:    1. Acute deep vein thrombosis (DVT) of other specified vein of right lower extremity ([place])   2. Essential hypertension   3. Hypercholesterolemia   4. S/P CABG x 5   5. Coronary artery disease involving coronary bypass graft of native heart without angina pectoris   6. Lower extremity edema      PLAN:  Mr. Vernal Hritz is a 66 year old gentleman who has a history of hypertension, hyperlipidemia, and recently underwent CABG surgery 5 on July 04, 2016.  He has noticed significant swelling postoperatively.  He had developed transient atrial fibrillation but has been maintaining sinus rhythm.  Although his ECG yesterday showed a bigeminal rhythm, on palpation today.  His heart rhythm was regular.  His Doppler study reveals right lower extremity nonocclusive venous thrombus in the peroneal vein.  I reviewed this with him.  Target him on anticoagulation with Xarelto 15 mg twice a day for 21 days.  Repeat blood work will be obtained today as well as a CBC to make certain his renal function is stable and if so, he will then continue 20 mg of Xarelto daily after the  21 day loading.  I am also adding HCTZ 12.5 mg for the next 3 days, which will also be helpful for his mild hypertension and mild residual edema.  He will then take this on an as-needed basis.  We will try to move up his appointment to see Dr. Martinique, which had been scheduled in July so that he can be seen in a proximally 4 weeks for reevaluation.  Medication Adjustments/Labs and Tests Ordered: Current medicines are reviewed at length with the patient today.  Concerns regarding medicines are outlined above.  Medication changes, Labs and Tests ordered today are listed in the Patient Instructions below. Patient Instructions  Your physician has recommended you make the following change in your medication:   1.) the rosuvastatin has been increased from 5 mg to 10 mg daily.  2.) start fluid pill ( HCTZ) as directed.  3.) start xarelto as directed by the clinical pharmacist.  Your physician recommends that you return for lab work today.  Your physician recommends that you schedule a follow-up appointment in: 4 weeks with Dr Martinique.       Signed, Shelva Majestic, MD  07/31/2016 [facility] [address] [place], [place], [place]  [address] Phone: [phone]

## 2016-08-08 ENCOUNTER — Other Ambulatory Visit: Payer: Self-pay | Admitting: Cardiothoracic Surgery

## 2016-08-08 DIAGNOSIS — Z951 Presence of aortocoronary bypass graft: Secondary | ICD-10-CM

## 2016-08-09 ENCOUNTER — Telehealth: Payer: Self-pay | Admitting: Internal Medicine

## 2016-08-09 NOTE — Telephone Encounter (Signed)
Patient called with complaints of feelings of fullness in the bladder and sensation of not being able to completely void.  No previous history of such.  Recent surgery approximately 1 month ago for CABG.  Concerned he may have a UTI.  I've called in 5 days of 500 mg Cipro and told him that if he does not have symptomatic relief from that he needs to be seen for urine culture.  Link Snuffer, MD, PhD Cardiology

## 2016-08-11 ENCOUNTER — Ambulatory Visit (INDEPENDENT_AMBULATORY_CARE_PROVIDER_SITE_OTHER): Payer: Self-pay | Admitting: Physician Assistant

## 2016-08-11 ENCOUNTER — Ambulatory Visit
Admission: RE | Admit: 2016-08-11 | Discharge: 2016-08-11 | Disposition: A | Discharge status: Home / Self Care | Payer: Medicare Other | Source: Ambulatory Visit | Attending: Cardiothoracic Surgery | Admitting: Cardiothoracic Surgery

## 2016-08-11 VITALS — BP 120/80 | HR 84 | Resp 16 | Ht 70.0 in | Wt 247.0 lb

## 2016-08-11 DIAGNOSIS — Z951 Presence of aortocoronary bypass graft: Secondary | ICD-10-CM

## 2016-08-11 DIAGNOSIS — J90 Pleural effusion, not elsewhere classified: Secondary | ICD-10-CM | POA: Diagnosis not present

## 2016-08-11 NOTE — Progress Notes (Signed)
HPI:  Patient returns for routine postoperative follow-up having undergone urgent CABG x 5 on 07/04/2016 The patient's early postoperative recovery while in the hospital was notable for Atrial Fibrillation and was treated with Amiodarone.  Since hospital discharge the patient reports he is doing much better.  Immediately after surgery he developed a persistent cough.  He was not on an ACE inhibitor and was instructed to stop his BB.  He also attributed his coughing to Amiodarone and requested at Cardiology follow up for this to be discontinued.  He was started on Cardizem for rate control.  At his cardiology visit they were concerned with how swollen his RLE was, and ultrasound was ordered.  He was placed on Xarelto for DVT and he is hoping to stop this soon as he doesn't like to take medications.  He has been doing much better since cessation of those medications.  He is no longer experiencing a cough.  He continues to have some oozing from his RLE stab incision.    Current Outpatient Prescriptions  Medication Sig Dispense Refill  . aspirin EC 81 MG tablet Take 81 mg by mouth daily.    Marland Kitchen diltiazem (CARDIZEM CD) 120 MG 24 hr capsule Take 1 capsule (120 mg total) by mouth daily. 90 capsule 3  . ferrous sulfate 325 (65 FE) MG tablet Take 1 tablet (325 mg total) by mouth daily with breakfast. For one month then stop.  3  . folic acid (FOLVITE) 1 MG tablet Take 1 tablet (1 mg total) by mouth daily. For one month then stop.    . hydrochlorothiazide (MICROZIDE) 12.5 MG capsule Take 1 tablet for 3 days, then take only as needed thereafter 30 capsule 3  . Rivaroxaban (XARELTO) 15 MG TABS tablet Take 1 tablet (15 mg total) by mouth 2 (two) times daily with a meal. (Patient taking differently: Take 15 mg by mouth 2 (two) times daily with a meal. ENDS 08/18/16) 14 tablet 0  . rosuvastatin (CRESTOR) 10 MG tablet Take 1 tablet (10 mg total) by mouth daily. 30 tablet 6  . rivaroxaban (XARELTO) 20 MG TABS tablet Take 1  tablet (20 mg total) by mouth daily with supper. (Patient not taking: Reported on 08/11/2016) 30 tablet 6   No current facility-administered medications for this visit.     Physical Exam:  BP 120/80 (BP Location: Right Arm, Patient Position: Sitting, Cuff Size: Large)   Pulse 84   Resp 16   Ht  (1.778 m)   Wt 247 lb (112 kg)   SpO2 98% Comment: RA  BMI 35.44 kg/m   Gen: no apparent distress Heart: RRR Lungs: mildly diminished left base Ext: EVH site well healed, stab incision is scabbed over, no drainage present, there are venous stasis changes noted Incisions: sternotomy well healed  Diagnostic Tests:  CXR: no pneumothorax, stable post surgical changes present, minimal left posterior pleural effusion  A/P:  1. S/P CABG x 5- patient doing very well.  I agree with Cardiology note that BB was not likely source of cough.  I tried to educate patient on importance of BB with attempt to try Coreg without symptoms, to which he declined 2. Right DVT-on Xarelto 3. Right LE EVH stab site- currently scabbed over, instructed patient to contact our office should it open again so we can assess if there is a possible stitch abscess 4. RTC prn  Lowella Dandy, PA-C [facility] [phone]

## 2016-08-12 ENCOUNTER — Telehealth (HOSPITAL_COMMUNITY): Payer: Self-pay | Admitting: Family Medicine

## 2016-08-12 NOTE — Telephone Encounter (Signed)
I called and left message on patient voicemail to call office about scheduling and participating in the cardiac rehab program. I left my contact information on patient voicemail to return my call.

## 2016-08-23 NOTE — Progress Notes (Signed)
Cardiology Office Note    Date:  08/26/2016   ID:  Joseph Kidd, DOB August 11, 1950, MRN [id number]  PCP:  WHITE, Bonnell Public, NP  Cardiologist:  Dr. Izic Stfort [place]  Chief Complaint  Patient presents with  . Coronary Artery Disease    History of Present Illness:  Joseph Kidd is a 66 y.o. male with PMH of HTN, HLD intolerant to statins, OA and recently diagnosed CAD s/p CABG. He presented to the hospital with inferior STEMI on 07/03/2016. Emergent cardiac catheterization performed on 07/03/16 showed 95% proximal to mid RCA stenosis, 30% ostial to proximal RCA stenosis, 95% proximal LAD, 80% mid LAD, 100% occluded proximal left circumflex with left to left and right-to-left collaterals, 90% ostial OM1 lesion, EF 50-55%. Echocardiogram performed on the same day showed EF 60-65%, mild LVH, dilated aortic root up to 40 mm, mild MR. He underwent 5 vessel CABG with LIMA to LAD, SVG to diagonal, sequential SVG to OM1 and OM 3, SVG to RCA by Dr. Tyrone Sage on 07/04/2013. Post procedure, the hospital course involved volume overload requiring diuresis and also atrial fibrillation with RVR which converted on IV amiodarone. He was also initiated on Crestor.  When seen in follow up April 2 he complained of persistent wheezing and beta blocker was discontinued. Lisinopril had been discontinued earlier.  He was maintaining NSR and amiodarone was stopped. He was noted to have right leg swelling. LE venous dopplers demonstrated right peroneal vein DVT. He was started on Xarelto.   He completed 21 days of Xarelto 15 mg bid and is now on 20 mg daily. His right LE swelling has improved significantly. He is back working daily. His activity is limited by severe arthritis of both knees. He states he needs to have bilateral knee replacements. He denies any chest pain or palpitations. Wheezing has resolved off beta blocker. Minimal cough.    Past Medical History:  Diagnosis Date  . Hypercholesterolemia   . Hypertension   .  Osteoarthritis     Past Surgical History:  Procedure Laterality Date  . CORONARY ARTERY BYPASS GRAFT N/A 07/04/2016   Procedure: CORONARY ARTERY BYPASS GRAFT times five with left internal mammary artery to Left Anterior Descending artery : endoscopic harvest of right saphenous vein with grafts to Diagonal, Right Coronary, Obtuse Marginal 1, Obtuse Marginal 3 Arteries. Insertion of right groin femoral a line;  Surgeon: Delight Ovens, MD;  Location: [facility] OR;  Service: Open Heart Surgery;  Laterality: N/A;  . INTRAOPERATIVE TRANSESOPHAGEAL ECHOCARDIOGRAM N/A 07/04/2016   Procedure: INTRAOPERATIVE TRANSESOPHAGEAL ECHOCARDIOGRAM;  Surgeon: Delight Ovens, MD;  Location: [facility] OR;  Service: Open Heart Surgery;  Laterality: N/A;  . LEFT HEART CATH AND CORONARY ANGIOGRAPHY N/A 07/03/2016   Procedure: Left Heart Cath and Coronary Angiography;  Surgeon: Izekiel Flegel M [place], MD;  Location: [facility] INVASIVE CV LAB;  Service: Cardiovascular;  Laterality: N/A;    Current Medications: Outpatient Medications Prior to Visit  Medication Sig Dispense Refill  . aspirin EC 81 MG tablet Take 81 mg by mouth daily.    Marland Kitchen diltiazem (CARDIZEM CD) 120 MG 24 hr capsule Take 1 capsule (120 mg total) by mouth daily. 90 capsule 3  . rivaroxaban (XARELTO) 20 MG TABS tablet Take 1 tablet (20 mg total) by mouth daily with supper. 30 tablet 6  . rosuvastatin (CRESTOR) 10 MG tablet Take 1 tablet (10 mg total) by mouth daily. 30 tablet 6  . ferrous sulfate 325 (65 FE) MG tablet Take 1 tablet (325 mg  total) by mouth daily with breakfast. For one month then stop. (Patient not taking: Reported on 08/26/2016)  3  . folic acid (FOLVITE) 1 MG tablet Take 1 tablet (1 mg total) by mouth daily. For one month then stop. (Patient not taking: Reported on 08/26/2016)    . hydrochlorothiazide (MICROZIDE) 12.5 MG capsule Take 1 tablet for 3 days, then take only as needed thereafter (Patient not taking: Reported on 08/26/2016) 30 capsule 3  . Rivaroxaban (XARELTO)  15 MG TABS tablet Take 1 tablet (15 mg total) by mouth 2 (two) times daily with a meal. (Patient not taking: Reported on 08/26/2016) 14 tablet 0   No facility-administered medications prior to visit.      Allergies:   Atorvastatin and Chlorhexidine   Social History   Social History  . Marital status: Married    Spouse name: N/A  . Number of children: 1  . Years of education: N/A   Occupational History  . retired Copywriter, advertising    Social History Main Topics  . Smoking status: Never Smoker  . Smokeless tobacco: Never Used  . Alcohol use No  . Drug use: No  . Sexual activity: Not Asked   Other Topics Concern  . None   Social History Narrative  . None     Family History:  The patient's family history includes Heart disease in his mother; Hypercholesterolemia in his mother.   ROS:   Please see the history of present illness.    ROS All other systems reviewed and are negative.   PHYSICAL EXAM:   VS:  BP (!) 152/100   Pulse 76   Ht  (1.803 m)   Wt 236 lb 6.4 oz (107.2 kg)   BMI 32.97 kg/m    GEN: Well nourished, well developed, in no acute distress  HEENT: normal  Neck: no JVD, carotid bruits, or masses Cardiac: RRR; no murmurs, rubs, or gallops. Right leg with 1+ edema but no cords or erythema.  Respiratory:  clear to auscultation bilaterally, normal work of breathing GI: soft, nontender, nondistended, + BS MS: no deformity or atrophy  Skin: warm and dry, no rash Neuro:  Alert and Oriented x 3, Strength and sensation are intact Psych: euthymic mood, full affect  Wt Readings from Last 3 Encounters:  08/26/16 236 lb 6.4 oz (107.2 kg)  08/11/16 247 lb (112 kg)  07/29/16 247 lb (112 kg)      Studies/Labs Reviewed:   EKG:  EKG is not ordered today.    Recent Labs: 07/05/2016: Magnesium 2.4 07/29/2016: ALT 29; BUN 14; Creat 1.04; Hemoglobin 11.7; Platelets 359; Potassium 4.5; Sodium 139   Lipid Panel    Component Value Date/Time   CHOL 248 (H) 07/04/2016 0436     CHOL 241 (H) 07/04/2016 0436   TRIG 129 07/04/2016 0436   TRIG 141 07/04/2016 0436   HDL 23 (L) 07/04/2016 0436   HDL 23 (L) 07/04/2016 0436   CHOLHDL 10.8 07/04/2016 0436   CHOLHDL 10.5 07/04/2016 0436   VLDL 26 07/04/2016 0436   VLDL 28 07/04/2016 0436   LDLCALC 199 (H) 07/04/2016 0436   LDLCALC 190 (H) 07/04/2016 0436    Additional studies/ records that were reviewed today include:   Cath 07/03/2016 Conclusion     Prox RCA to Mid RCA lesion, 95 %stenosed.  Ost RCA to Prox RCA lesion, 30 %stenosed.  Mid RCA lesion, 40 %stenosed.  Prox LAD lesion, 95 %stenosed.  Mid LAD-2 lesion, 75 %stenosed.  Mid LAD-1 lesion,  80 %stenosed.  Ost 1st Mrg to 1st Mrg lesion, 90 %stenosed.  Prox Cx to Mid Cx lesion, 100 %stenosed.  The left ventricular systolic function is normal.  LV end diastolic pressure is normal.  The left ventricular ejection fraction is 50-55% by visual estimate.   1. Critical 3 vessel obstructive CAD.     - complex 95% proximal LAD, 75-80% diffuse mid LAD    - 90% OM1    - 100% mid LCx with left to left and right to left collaterals    - 95% proximal RCA 2. Good LV function 3. Normal LVEDP  Plan: Patient is currently pain free- will aggressively threat medically with ASA, IV Ntg, IV heparin, beta blocker. Consult CT surgery  for CABG.      Echo 07/03/2016 LV EF: 60% -   65%  ------------------------------------------------------------------- Indications:      Chest pain 786.51.  ------------------------------------------------------------------- History:   Risk factors:  NSTEMI. Hypertension. Dyslipidemia.  ------------------------------------------------------------------- Study Conclusions  - Left ventricle: The cavity size was normal. Wall thickness was   increased in a pattern of mild LVH. Systolic function was normal.   The estimated ejection fraction was in the range of 60% to 65%.   Wall motion was normal; there were no regional  wall motion   abnormalities. Left ventricular diastolic function parameters   were normal. - Aortic valve: Mildly calcified annulus. There was trivial   regurgitation. - Aorta: Mild aortic root dilatation. Aortic root dimension: 40 mm   (ED). - Mitral valve: There was mild regurgitation.    CABG x 5 by Dr. Tyrone Sage 07/04/2016 PRE-OPERATIVE DIAGNOSIS:  1. S/p NSTEMI 2. Coronary artery disease  POST-OPERATIVE DIAGNOSIS:  1. S/p NSTEMI 2. Coronary artery disease  PROCEDURE: INTRAOPERATIVE TRANSESOPHAGEAL ECHOCARDIOGRAM, MEDIAN STERNOTOMY for CORONARY ARTERY BYPASS GRAFT x 5 (LIMA to LAD, SVG to DIAGONAL, SVG SEQUENTIALLY to OM1 and OM3, and SVG to RCA) with  endoscopic harvest of right greater saphenous vein with grafts to    ASSESSMENT:    1. S/P CABG x 5   2. Hypercholesterolemia   3. Essential hypertension   4. Acute deep vein thrombosis (DVT) of other specified vein of right lower extremity (HCC)      PLAN:  In order of problems listed above:  1. CAD s/p CABG x 5: Denies any angina since CABG, midline sternal wound is well-healed.  Continue ASA daily. On Cardizem and HCT for BP control.   Post op PAF: resolved. No clinical recurrence.   2. RLE peroneal DVT: He had right lower extremity vein harvesting. On Xarelto. Will need to continue for total of 3 months- so on July 8.   3. HTN: Blood pressure stable  4. HLD: Intolerant to Lipitor as before, he was started on Crestor 5 mg daily, recently increased to 10 mg daily. Tolerating well. Check fasting lipids today.  5. Severe osteoarthritis knees. Patient to follow up with Dr. Cleophas Dunker. Needs to complete anticoagulant course before having knee surgery. When he does have surgery should received DVT prophylaxis since he will be at higher risk of recurrence.     Medication Adjustments/Labs and Tests Ordered: Current medicines are reviewed at length with the patient today.  Concerns regarding medicines are outlined above.   Medication changes, Labs and Tests ordered today are listed in the Patient Instructions below. Patient Instructions  We will check blood work today  Continue your current therapy  You will take Xarelto for total of 3 months or until July 8.  You should be Ok to have knee surgery after that time  I will see you in 3 months.    Signed, Shaquel Josephson, MD  08/26/2016 12:09 PM    [facility] [address] [place], [place], [place]  [address] Phone: [phone]; Fax: [phone]

## 2016-08-26 ENCOUNTER — Telehealth (HOSPITAL_COMMUNITY): Payer: Self-pay | Admitting: Family Medicine

## 2016-08-26 ENCOUNTER — Ambulatory Visit (INDEPENDENT_AMBULATORY_CARE_PROVIDER_SITE_OTHER): Payer: Medicare Other | Admitting: Cardiology

## 2016-08-26 ENCOUNTER — Encounter: Payer: Self-pay | Admitting: Cardiology

## 2016-08-26 VITALS — BP 152/100 | HR 76 | Ht 71.0 in | Wt 236.4 lb

## 2016-08-26 DIAGNOSIS — I25810 Atherosclerosis of coronary artery bypass graft(s) without angina pectoris: Secondary | ICD-10-CM | POA: Diagnosis not present

## 2016-08-26 DIAGNOSIS — I10 Essential (primary) hypertension: Secondary | ICD-10-CM | POA: Diagnosis not present

## 2016-08-26 DIAGNOSIS — E7800 Pure hypercholesterolemia, unspecified: Secondary | ICD-10-CM

## 2016-08-26 DIAGNOSIS — Z951 Presence of aortocoronary bypass graft: Secondary | ICD-10-CM

## 2016-08-26 DIAGNOSIS — I82491 Acute embolism and thrombosis of other specified deep vein of right lower extremity: Secondary | ICD-10-CM | POA: Diagnosis not present

## 2016-08-26 LAB — LIPID PANEL
CHOL/HDL RATIO: 5.4 ratio — AB (ref <5.0)
Cholesterol: 146 mg/dL (ref <200)
HDL: 27 mg/dL — AB (ref >40)
LDL Cholesterol: 100 mg/dL — ABNORMAL HIGH (ref <100)
TRIGLYCERIDES: 95 mg/dL (ref <150)
VLDL: 19 mg/dL (ref <30)

## 2016-08-26 LAB — BASIC METABOLIC PANEL
BUN: 19 mg/dL (ref 7–25)
CALCIUM: 9 mg/dL (ref 8.6–10.3)
CHLORIDE: 105 mmol/L (ref 98–110)
CO2: 24 mmol/L (ref 20–31)
Creat: 1.04 mg/dL (ref 0.70–1.25)
Glucose, Bld: 93 mg/dL (ref 65–99)
Potassium: 4.4 mmol/L (ref 3.5–5.3)
SODIUM: 139 mmol/L (ref 135–146)

## 2016-08-26 LAB — HEPATIC FUNCTION PANEL
ALBUMIN: 3.6 g/dL (ref 3.6–5.1)
ALT: 32 U/L (ref 9–46)
AST: 21 U/L (ref 10–35)
Alkaline Phosphatase: 98 U/L (ref 40–115)
BILIRUBIN TOTAL: 0.4 mg/dL (ref 0.2–1.2)
Bilirubin, Direct: 0.1 mg/dL (ref <=0.2)
Indirect Bilirubin: 0.3 mg/dL (ref 0.2–1.2)
Total Protein: 7.1 g/dL (ref 6.1–8.1)

## 2016-08-26 NOTE — Telephone Encounter (Signed)
I called and left message on patient voicemail to call office about scheduling and participating in the Cardiac Rehab program. I left my contact information on patient voicemail.  ° °

## 2016-08-26 NOTE — Patient Instructions (Signed)
We will check blood work today  Continue your current therapy  You will take Xarelto for total of 3 months or until July 8.   You should be Ok to have knee surgery after that time  I will see you in 3 months.

## 2016-08-27 ENCOUNTER — Other Ambulatory Visit: Payer: Self-pay

## 2016-08-27 ENCOUNTER — Telehealth: Payer: Self-pay | Admitting: Cardiology

## 2016-08-27 DIAGNOSIS — E7800 Pure hypercholesterolemia, unspecified: Secondary | ICD-10-CM

## 2016-08-27 DIAGNOSIS — I25810 Atherosclerosis of coronary artery bypass graft(s) without angina pectoris: Secondary | ICD-10-CM

## 2016-08-27 NOTE — Telephone Encounter (Signed)
New message     Pt is returning Joseph Kidd call for results

## 2016-08-27 NOTE — Telephone Encounter (Signed)
Returned call to patient lab results given. 

## 2016-09-02 ENCOUNTER — Telehealth: Payer: Self-pay | Admitting: Cardiology

## 2016-09-02 ENCOUNTER — Encounter (HOSPITAL_COMMUNITY): Payer: Self-pay | Admitting: Family Medicine

## 2016-09-02 ENCOUNTER — Telehealth (HOSPITAL_COMMUNITY): Payer: Self-pay | Admitting: Family Medicine

## 2016-09-02 NOTE — Telephone Encounter (Signed)
s/w pt states that he is calling to see if Dr [place] will fill Ambien rx he states that he is not sleeping and states that he has discussed this with Dr [place] before. Pharmacy changed per pt request

## 2016-09-02 NOTE — Telephone Encounter (Signed)
Returned call to patient advised to call PCP for Ambien refill.

## 2016-09-02 NOTE — Progress Notes (Signed)
Final Notice - I mailed patient letter with information about Cardiac Rehab. Patient has been called 2X. If no response to letter referral will be closed.   °

## 2016-09-02 NOTE — Telephone Encounter (Signed)
*  Final Notice* I mailed patient letter with information about the Cardiac Rehab program. Patient has been called 2X. If patient does not respond to letter sent referral will be closed.

## 2016-09-02 NOTE — Telephone Encounter (Signed)
New message ° ° ° ° °Returning a call to the nurse to get lab results °

## 2016-09-29 NOTE — Addendum Note (Signed)
Addendum  created 09/29/16 1222 by Druscilla Petsch, MD   Sign clinical note    

## 2016-10-06 ENCOUNTER — Encounter (INDEPENDENT_AMBULATORY_CARE_PROVIDER_SITE_OTHER): Payer: Self-pay | Admitting: Orthopaedic Surgery

## 2016-10-06 ENCOUNTER — Ambulatory Visit (INDEPENDENT_AMBULATORY_CARE_PROVIDER_SITE_OTHER): Payer: Medicare Other

## 2016-10-06 ENCOUNTER — Ambulatory Visit (INDEPENDENT_AMBULATORY_CARE_PROVIDER_SITE_OTHER): Payer: Medicare Other | Admitting: Orthopaedic Surgery

## 2016-10-06 VITALS — BP 141/86 | HR 75 | Resp 14 | Ht 71.0 in | Wt 235.0 lb

## 2016-10-06 DIAGNOSIS — M25561 Pain in right knee: Secondary | ICD-10-CM

## 2016-10-06 DIAGNOSIS — G8929 Other chronic pain: Secondary | ICD-10-CM

## 2016-10-06 DIAGNOSIS — M25562 Pain in left knee: Secondary | ICD-10-CM

## 2016-10-06 DIAGNOSIS — I25810 Atherosclerosis of coronary artery bypass graft(s) without angina pectoris: Secondary | ICD-10-CM

## 2016-10-06 NOTE — Progress Notes (Signed)
Office Visit Note   Patient: Joseph Kidd           Date of Birth: 02/25/1951           MRN: [id number] Visit Date: 10/06/2016              Requested by: April MansonWhite, Marsha L, NP [address] [place], [place] [id number] PCP: April MansonWhite, Marsha L, NP   Assessment & Plan: Visit Diagnoses:  1. Chronic pain of left knee   2. Chronic pain of right knee    End-stage osteoarthritis right and left knee Plan: Long discussion regarding treatment options. Joseph Kidd would like to proceed with a left total knee replacement. He will need clearance forms Dr. Levora Werden [place] and his primary care physician we've discussed the hospitalization, surgery, postoperative rehabilitation. He's had a recent DVT in the right lower extremity treated by Dr. [place] with xarelto. This will be discontinued  next month. I would wait till after the treatment is finished before proceeding.. Office visit of 40 minutes regarding all of the above and discussing surgery, rehabilitation, and staging of knee replacement  Follow-Up Instructions: Return will schedule left knee replacementr.   Orders:  Orders Placed This Encounter  Procedures  . XR KNEE 3 VIEW RIGHT  . XR KNEE 3 VIEW LEFT   No orders of the defined types were placed in this encounter.     Procedures: No procedures performed   Clinical Data: No additional findings.   Subjective: Chief Complaint  Patient presents with  . Right Knee - Pain  . Left Knee - Pain  Joseph Kidd is been seen on number of occasions in the past for end-stage osteoarthritis of both knees. He is completed a course of Visco supplementation with only minimal proximally a year ago. He is reached a point where he really is having miserable time with sleeping walking and performing activities of daily living and wishes to discuss knee replacement. Many years ago he had a crush injury to his right leg when he was kicked by a horse. Not really had any problems since that time. He just  recently was diagnosed with DVT in his right lower extremity and treated by Dr. [place] with the above. He completes thexarelto in early July. He like to stage his knee replacement starting with the left  HPI  Review of Systems   Objective: Vital Signs: BP (!) 141/86   Pulse 75   Resp 14   Ht 5\' 11"  (1.803 m)   Wt 235 lb (106.6 kg)   BMI 32.78 kg/m   Physical Exam  Ortho Exam Mild effusion both knees. Significant increased varus bilaterally. Full extension and flexion over 105. No instability. No calf pain. No distal edema. Mild venous stasis changes bilaterally. Good pulses predominantly medial joint pain bilaterally. Straight leg raise negative. Painless range of motion both hips Specialty Comments:  No specialty comments available.  Imaging: Xr Knee 3 View Left  Result Date: 10/06/2016 Films of the left knee were very similar to those of the right. There is complete collapse of the medial compartment with bone-on-bone. Approximately 9 of varus. Significant degenerative change at the patellofemoral joint. Patella tracks in the midline. All of the above consistent with end-stage osteoarthritis  Xr Knee 3 View Right  Result Date: 10/06/2016 Films of the right knee replacement standing projection in 3 views. There is complete collapse of the medial compartment with bone-on-bone. Approximately 13 of varus. Flattening of both medial lateral femoral  condyles and some erosion of the medial tibial plateau considerable lateral tilt and decrease in the lateral patellofemoral joint space with peripheral osteophytes and some calcification in the femoral artery. Pryor clips from vein harvesting. Possible loose bodies in the posterior gutter    PMFS History: Patient Active Problem List   Diagnosis Date Noted  . S/P CABG x 5   . Coronary artery disease 07/04/2016  . Non-ST elevation (NSTEMI) myocardial infarction (HCC) 07/03/2016  . HTN (hypertension) 07/03/2016  . Hypercholesterolemia  07/03/2016  . NSTEMI (non-ST elevated myocardial infarction) (HCC) 07/03/2016  . Chest pain 07/03/2016   Past Medical History:  Diagnosis Date  . Hypercholesterolemia   . Hypertension   . Osteoarthritis     Family History  Problem Relation Age of Onset  . Heart disease Mother   . Hypercholesterolemia Mother     Past Surgical History:  Procedure Laterality Date  . CORONARY ARTERY BYPASS GRAFT N/A 07/04/2016   Procedure: CORONARY ARTERY BYPASS GRAFT times five with left internal mammary artery to Left Anterior Descending artery : endoscopic harvest of right saphenous vein with grafts to Diagonal, Right Coronary, Obtuse Marginal 1, Obtuse Marginal 3 Arteries. Insertion of right groin femoral a line;  Surgeon: Delight Ovens, MD;  Location: [facility] OR;  Service: Open Heart Surgery;  Laterality: N/A;  . INTRAOPERATIVE TRANSESOPHAGEAL ECHOCARDIOGRAM N/A 07/04/2016   Procedure: INTRAOPERATIVE TRANSESOPHAGEAL ECHOCARDIOGRAM;  Surgeon: Delight Ovens, MD;  Location: [facility] OR;  Service: Open Heart Surgery;  Laterality: N/A;  . LEFT HEART CATH AND CORONARY ANGIOGRAPHY N/A 07/03/2016   Procedure: Left Heart Cath and Coronary Angiography;  Surgeon: Haydn Hutsell M [place], MD;  Location: [facility] INVASIVE CV LAB;  Service: Cardiovascular;  Laterality: N/A;   Social History   Occupational History  . retired Copywriter, advertising    Social History Main Topics  . Smoking status: Never Smoker  . Smokeless tobacco: Never Used  . Alcohol use No  . Drug use: No  . Sexual activity: Not on file     Valeria Batman, MD   Note - This record has been created using [employer].  Chart creation errors have been sought, but may not always  have been located. Such creation errors do not reflect on  the standard of medical care.

## 2016-10-10 ENCOUNTER — Telehealth: Payer: Self-pay

## 2016-10-10 NOTE — Telephone Encounter (Signed)
[facility]Dr.Jordan cleared patient for upcoming surgery.Form faxed back to fax # [phone].

## 2016-10-13 ENCOUNTER — Telehealth (INDEPENDENT_AMBULATORY_CARE_PROVIDER_SITE_OTHER): Payer: Self-pay | Admitting: Orthopaedic Surgery

## 2016-10-13 ENCOUNTER — Encounter (INDEPENDENT_AMBULATORY_CARE_PROVIDER_SITE_OTHER): Payer: Self-pay | Admitting: Orthopaedic Surgery

## 2016-10-13 NOTE — Telephone Encounter (Signed)
LVM for pt to call to schedule surgery. Will try pt again at a later time. 

## 2016-10-21 ENCOUNTER — Telehealth: Payer: Self-pay | Admitting: Cardiology

## 2016-10-21 NOTE — Telephone Encounter (Signed)
Returned call to patient.Advised not to take Aleve since he is taking Xarelto.He stated he will be holding Xarelto starting 11/02/16.He wanted to know would it be ok then to take Aleve.Advised I will send message to our pharmacist for advice.

## 2016-10-21 NOTE — Telephone Encounter (Signed)
Spoke with patient, he is on Xarelto only until July 8 for previous DVT.  Will have knee surgery on July 31, but until then the arthritis causes his knees to hurt to the point where he can't sleep well.  He admits that his sleep has been off since having surgery in March, and uses [place] most nights.  Advised that he can use Aleve, but we recommend he use sparingly.  Suggested he use for 3-4 nights, then try a few nights without, then on a prn basis.    Patient voiced understanding

## 2016-10-21 NOTE — Telephone Encounter (Signed)
Patient calling, states that he will have knee surgery on July 31st and would like to know if he can take an aleve every night before bed. Thanks.

## 2016-10-31 ENCOUNTER — Ambulatory Visit: Payer: Medicare Other | Admitting: Cardiology

## 2016-11-11 NOTE — Pre-Procedure Instructions (Signed)
EXCELL NEYLAND  11/11/2016      [facility] - [address], [place] - [address] [place] [address] Phone: [phone] Fax: [phone]  [id number] - [place], [place] - [address] [place] [address] Phone: [phone] Fax: [phone]    Your procedure is scheduled on Tuesday, November 25, 2016.  Report to [facility] Admitting at 8:15 A.M.  Call this number if you have problems the morning of surgery:  [phone]   Remember:  Do not eat food or drink liquids after midnight.  Take these medicines the morning of surgery with A SIP OF WATER: Diltiazem (Cardizem)  7 days prior to surgery STOP taking any Aspirin, Aleve, Naproxen, Ibuprofen, Motrin, Advil, Goody's, BC's, all herbal medications, fish oil, and all vitamins    Do not wear jewelry, make-up or nail polish.  Do not wear lotions, powders, or perfumes, or deodorant.  Men may shave face and neck.  Do not bring valuables to the hospital.  [facility] is not responsible for any belongings or valuables.  Contacts, dentures or bridgework may not be worn into surgery.  Leave your suitcase in the car.  After surgery it may be brought to your room.  For patients admitted to the hospital, discharge time will be determined by your treatment team.  Patients discharged the day of surgery will not be allowed to drive home.   Name and phone number of your driver:    Special instructions:   [facility]- Preparing For Surgery  Before surgery, you can play an important role. Because skin is not sterile, your skin needs to be as free of germs as possible. You can reduce the number of germs on your skin by washing with CHG (chlorahexidine gluconate) Soap before surgery.  CHG is an antiseptic cleaner which kills germs and bonds with the skin to continue killing germs even after washing.  Please do not use if you have an allergy to CHG or  antibacterial soaps. If your skin becomes reddened/irritated stop using the CHG.  Do not shave (including legs and underarms) for at least 48 hours prior to first CHG shower. It is OK to shave your face.  Please follow these instructions carefully.   1. Shower the NIGHT BEFORE SURGERY and the MORNING OF SURGERY with CHG.   2. If you chose to wash your hair, wash your hair first as usual with your normal shampoo.  3. After you shampoo, rinse your hair and body thoroughly to remove the shampoo.  4. Use CHG as you would any other liquid soap. You can apply CHG directly to the skin and wash gently with a scrungie or a clean washcloth.   5. Apply the CHG Soap to your body ONLY FROM THE NECK DOWN.  Do not use on open wounds or open sores. Avoid contact with your eyes, ears, mouth and genitals (private parts). Wash genitals (private parts) with your normal soap.  6. Wash thoroughly, paying special attention to the area where your surgery will be performed.  7. Thoroughly rinse your body with warm water from the neck down.  8. DO NOT shower/wash with your normal soap after using and rinsing off the CHG Soap.  9. Pat yourself dry with a CLEAN TOWEL.   10. Wear CLEAN PAJAMAS   11. Place CLEAN SHEETS on your bed the night of your first shower and DO NOT  SLEEP WITH PETS.    Day of Surgery: shower as stated above Do not apply any deodorants/lotions. Please wear clean clothes to the hospital/surgery center.      Please read over the following fact sheets that you were given. Pain Booklet, Coughing and Deep Breathing, Total Joint Packet, MRSA Information and Surgical Site Infection Prevention

## 2016-11-12 ENCOUNTER — Ambulatory Visit (INDEPENDENT_AMBULATORY_CARE_PROVIDER_SITE_OTHER): Payer: Medicare Other | Admitting: Orthopedic Surgery

## 2016-11-12 ENCOUNTER — Encounter (INDEPENDENT_AMBULATORY_CARE_PROVIDER_SITE_OTHER): Payer: Self-pay | Admitting: Orthopedic Surgery

## 2016-11-12 ENCOUNTER — Encounter (HOSPITAL_COMMUNITY)
Admission: RE | Admit: 2016-11-12 | Discharge: 2016-11-12 | Disposition: A | Discharge status: Home / Self Care | Payer: Medicare Other | Source: Ambulatory Visit | Attending: Orthopaedic Surgery | Admitting: Orthopaedic Surgery

## 2016-11-12 ENCOUNTER — Encounter (HOSPITAL_COMMUNITY): Payer: Self-pay

## 2016-11-12 VITALS — BP 136/80 | HR 82 | Temp 97.8°F | Resp 14 | Ht 71.0 in | Wt 232.0 lb

## 2016-11-12 DIAGNOSIS — I2510 Atherosclerotic heart disease of native coronary artery without angina pectoris: Secondary | ICD-10-CM | POA: Insufficient documentation

## 2016-11-12 DIAGNOSIS — Z7982 Long term (current) use of aspirin: Secondary | ICD-10-CM | POA: Insufficient documentation

## 2016-11-12 DIAGNOSIS — I25810 Atherosclerosis of coronary artery bypass graft(s) without angina pectoris: Secondary | ICD-10-CM | POA: Diagnosis not present

## 2016-11-12 DIAGNOSIS — Z01818 Encounter for other preprocedural examination: Secondary | ICD-10-CM | POA: Diagnosis not present

## 2016-11-12 DIAGNOSIS — Z951 Presence of aortocoronary bypass graft: Secondary | ICD-10-CM | POA: Diagnosis not present

## 2016-11-12 DIAGNOSIS — E7800 Pure hypercholesterolemia, unspecified: Secondary | ICD-10-CM | POA: Insufficient documentation

## 2016-11-12 DIAGNOSIS — I10 Essential (primary) hypertension: Secondary | ICD-10-CM | POA: Diagnosis not present

## 2016-11-12 DIAGNOSIS — M1712 Unilateral primary osteoarthritis, left knee: Secondary | ICD-10-CM

## 2016-11-12 DIAGNOSIS — Z79899 Other long term (current) drug therapy: Secondary | ICD-10-CM | POA: Diagnosis not present

## 2016-11-12 DIAGNOSIS — I252 Old myocardial infarction: Secondary | ICD-10-CM | POA: Diagnosis not present

## 2016-11-12 HISTORY — DX: Atherosclerotic heart disease of native coronary artery without angina pectoris: I25.10

## 2016-11-12 HISTORY — DX: Acute embolism and thrombosis of unspecified deep veins of unspecified lower extremity: I82.409

## 2016-11-12 HISTORY — DX: Acute myocardial infarction, unspecified: I21.9

## 2016-11-12 LAB — URINALYSIS, ROUTINE W REFLEX MICROSCOPIC
Bilirubin Urine: NEGATIVE
GLUCOSE, UA: NEGATIVE mg/dL
HGB URINE DIPSTICK: NEGATIVE
Ketones, ur: NEGATIVE mg/dL
LEUKOCYTES UA: NEGATIVE
Nitrite: NEGATIVE
PH: 6 (ref 5.0–8.0)
Protein, ur: NEGATIVE mg/dL
SPECIFIC GRAVITY, URINE: 1.009 (ref 1.005–1.030)

## 2016-11-12 LAB — CBC WITH DIFFERENTIAL/PLATELET
Basophils Absolute: 0 10*3/uL (ref 0.0–0.1)
Basophils Relative: 1 %
EOS ABS: 0.1 10*3/uL (ref 0.0–0.7)
EOS PCT: 1 %
HCT: 40.5 % (ref 39.0–52.0)
HEMOGLOBIN: 12.9 g/dL — AB (ref 13.0–17.0)
LYMPHS ABS: 2.1 10*3/uL (ref 0.7–4.0)
LYMPHS PCT: 39 %
MCH: 26.5 pg (ref 26.0–34.0)
MCHC: 31.9 g/dL (ref 30.0–36.0)
MCV: 83.3 fL (ref 78.0–100.0)
MONOS PCT: 5 %
Monocytes Absolute: 0.3 10*3/uL (ref 0.1–1.0)
Neutro Abs: 3 10*3/uL (ref 1.7–7.7)
Neutrophils Relative %: 54 %
PLATELETS: 200 10*3/uL (ref 150–400)
RBC: 4.86 MIL/uL (ref 4.22–5.81)
RDW: 17.2 % — ABNORMAL HIGH (ref 11.5–15.5)
WBC: 5.5 10*3/uL (ref 4.0–10.5)

## 2016-11-12 LAB — SURGICAL PCR SCREEN
MRSA, PCR: NEGATIVE
STAPHYLOCOCCUS AUREUS: NEGATIVE

## 2016-11-12 LAB — TYPE AND SCREEN
ABO/RH(D): O POS
Antibody Screen: NEGATIVE

## 2016-11-12 LAB — COMPREHENSIVE METABOLIC PANEL
ALK PHOS: 75 U/L (ref 38–126)
ALT: 26 U/L (ref 17–63)
ANION GAP: 6 (ref 5–15)
AST: 23 U/L (ref 15–41)
Albumin: 4.1 g/dL (ref 3.5–5.0)
BUN: 12 mg/dL (ref 6–20)
CALCIUM: 9.2 mg/dL (ref 8.9–10.3)
CO2: 25 mmol/L (ref 22–32)
CREATININE: 0.95 mg/dL (ref 0.61–1.24)
Chloride: 106 mmol/L (ref 101–111)
Glucose, Bld: 97 mg/dL (ref 65–99)
Potassium: 4 mmol/L (ref 3.5–5.1)
SODIUM: 137 mmol/L (ref 135–145)
Total Bilirubin: 1.2 mg/dL (ref 0.3–1.2)
Total Protein: 7.3 g/dL (ref 6.5–8.1)

## 2016-11-12 LAB — APTT: aPTT: 31 seconds (ref 24–36)

## 2016-11-12 LAB — PROTIME-INR
INR: 1.06
PROTHROMBIN TIME: 13.9 s (ref 11.4–15.2)

## 2016-11-12 NOTE — Progress Notes (Signed)
Joseph Campbell, MD   Joseph Code, PA-C [address] [place], [place]  [address]                             [phone]   ORTHOPAEDIC HISTORY & PHYSICAL  Joseph Kidd MRN:  [id number] DOB/SEX:  1951-03-28/male  CHIEF COMPLAINT:  Painful left Knee  HISTORY: Joseph Kidd has been seen on numerous occasions in the past for end-stage osteoarthritis of both knees. He is completed a course of Visco supplementation with only minimal proximally a year ago. He is reached a point where he really is having miserable time with sleeping walking and performing activities of daily living and wishes to discuss knee replacement. Many years ago he had a crush injury to his right leg when he was kicked by a horse. Not really had any problems since that time. He just recently was diagnosed with DVT in his right lower extremity and treated by Dr. Swaziland with the above. He completed the xarelto in early July.   Patient is a 66 y.o. male presented with a history of pain in the left knee for 6 years. Onset of symptoms was gradual starting 6 years ago with rapidly worsening course since that time. Prior procedures on the knee are none. Patient has been treated conservatively with over-the-counter NSAIDs and activity modification. Patient currently rates pain in the knee at 7 out of 10 with activity. There is pain at night. present.  They have been previously treated with: NSAIDS: Ibuprofen, Naprosyn, NSAID with mild improvement  Knee injection with corticosteroid  was performed Knee injection with visco supplementation was performed Medications: NSAID, Steriods with mild improvement  PAST MEDICAL HISTORY: Patient Active Problem List   Diagnosis Date Noted  . S/P CABG x 5   . Coronary artery disease 07/04/2016  . Non-ST elevation (NSTEMI) myocardial infarction (HCC) 07/03/2016  . HTN (hypertension) 07/03/2016  . Hypercholesterolemia 07/03/2016  . NSTEMI (non-ST elevated myocardial infarction)  (HCC) 07/03/2016  . Chest pain 07/03/2016   Past Medical History:  Diagnosis Date  . Coronary artery disease   . DVT (deep venous thrombosis) (HCC)    right lower leg  . Hypercholesterolemia   . Hypertension   . Myocardial infarction (HCC)   . Osteoarthritis    Past Surgical History:  Procedure Laterality Date  . CARDIAC CATHETERIZATION    . CORONARY ARTERY BYPASS GRAFT N/A 07/04/2016   Procedure: CORONARY ARTERY BYPASS GRAFT times five with left internal mammary artery to Left Anterior Descending artery : endoscopic harvest of right saphenous vein with grafts to Diagonal, Right Coronary, Obtuse Marginal 1, Obtuse Marginal 3 Arteries. Insertion of right groin femoral a line;  Surgeon: Delight Ovens, MD;  Location: [facility] OR;  Service: Open Heart Surgery;  Laterality: N/A;  . INTRAOPERATIVE TRANSESOPHAGEAL ECHOCARDIOGRAM N/A 07/04/2016   Procedure: INTRAOPERATIVE TRANSESOPHAGEAL ECHOCARDIOGRAM;  Surgeon: Delight Ovens, MD;  Location: [facility] OR;  Service: Open Heart Surgery;  Laterality: N/A;  . LEFT HEART CATH AND CORONARY ANGIOGRAPHY N/A 07/03/2016   Procedure: Left Heart Cath and Coronary Angiography;  Surgeon: Peter M Swaziland, MD;  Location: [facility] INVASIVE CV LAB;  Service: Cardiovascular;  Laterality: N/A;     MEDICATIONS PRIOR TO ADMISSION: No current facility-administered medications for this encounter.   Current Outpatient Prescriptions:  .  aspirin EC 81 MG tablet, Take 81 mg by mouth daily., Disp: , Rfl:  .  diltiazem (CARDIZEM CD) 120 MG 24  hr capsule, Take 1 capsule (120 mg total) by mouth daily., Disp: 90 capsule, Rfl: 3 .  rosuvastatin (CRESTOR) 10 MG tablet, Take 1 tablet (10 mg total) by mouth daily., Disp: 30 tablet, Rfl: 6 .  zolpidem (AMBIEN) 10 MG tablet, Take 1 tablet by mouth at bedtime., Disp: , Rfl:    ALLERGIES:   Allergies  Allergen Reactions  . Atorvastatin     Severe myalgias  . Chlorhexidine Rash    REVIEW OF SYSTEMS:  Review of Systems  Constitutional:  Negative.   HENT: Negative.   Eyes: Negative.   Respiratory: Negative.   Cardiovascular:       S/p CABG 06/2016  Gastrointestinal: Negative.   Genitourinary: Negative.   Skin: Positive for rash (chlorhexidine).  Neurological: Negative.   Endo/Heme/Allergies: Negative.   Psychiatric/Behavioral: Negative.     FAMILY HISTORY:   Family History  Problem Relation Age of Onset  . Heart disease Mother   . Hypercholesterolemia Mother     SOCIAL HISTORY:   Social History   Occupational History  . retired Copywriter, advertising    Social History Main Topics  . Smoking status: Never Smoker  . Smokeless tobacco: Never Used  . Alcohol use Yes     Comment: very seldom  . Drug use: No  . Sexual activity: Not on file     EXAMINATION:  Vital signs in last 24 hours: BP 136/80   Pulse 82   Temp 97.8 F (36.6 C)   Resp 14   Ht 5\' 11"  (1.803 m)   Wt 232 lb (105.2 kg)   BMI 32.36 kg/m   Physical Exam  Constitutional: He is oriented to person, place, and time. He appears well-developed and well-nourished.  HENT:  Head: Normocephalic and atraumatic.  Eyes: Pupils are equal, round, and reactive to light. Conjunctivae and EOM are normal.  Neck: Neck supple.  Cardiovascular: Normal rate, regular rhythm, normal heart sounds and intact distal pulses.   Pulmonary/Chest: Effort normal and breath sounds normal.  Abdominal: Soft. Bowel sounds are normal.  Neurological: He is alert and oriented to person, place, and time.  Skin: Skin is warm and dry.  Psychiatric: He has a normal mood and affect. His behavior is normal. Judgment and thought content normal.   Left Knee Exam   Tenderness  The patient is experiencing tenderness in the lateral joint line and medial joint line.  Range of Motion  Extension: -5  Flexion: 120   Other  Erythema: absent  Comments:  Crepitance with ROM.  1-2+ effusion.  No warmth or erythema. Pseudolaxity with valgus stressing.  Good endpoint.        Imaging  Review Plain radiographs demonstrate severe degenerative joint disease of the left knee. The overall alignment is mild varus. The bone quality appears to be good for age and reported activity level.  ASSESSMENT: End stage arthritis, left knee  Past Medical History:  Diagnosis Date  . Coronary artery disease   . DVT (deep venous thrombosis) (HCC)    right lower leg  . Hypercholesterolemia   . Hypertension   . Myocardial infarction (HCC)   . Osteoarthritis     PLAN: Plan for left total knee replacement.  The patient history, physical examination and imaging studies are consistent with progressive degenerative joint disease of the left knee. The patient has failed conservative treatment.  The clearance notes were reviewed.  After discussion with the patient it was felt that Total Knee Replacement was indicated. The procedure,  risks, and benefits  of total knee arthroplasty were presented and reviewed. The risks including but not limited to aseptic loosening, infection, blood clots, vascular and nerve injury, stiffness, patella tracking problems and fracture complications among others were discussed. The patient acknowledged the explanation, agreed to proceed with total knee replacement.  Oris DroneBrian D. Aleda Granaetrarca, PA-C [facility] [phone]  11/12/2016 3:12 PM

## 2016-11-12 NOTE — Progress Notes (Signed)
PCP - Kathlen BrunswickMarsha White, NP Cardiologist - Dr. SwazilandJordan; cardiac clearance in Epic  Chest x-ray - 08/01/2016 EKG - 07/28/2016 Stress Test - patient denies ECHO/TEE - 07/03/2016 Cardiac Cath - 07/03/2016  Sleep Study - patient denies   Patient denies shortness of breath, fever, cough and chest pain at PAT appointment   Patient verbalized understanding of instructions that were given to them at the PAT appointment. Patient was also instructed that they will need to review over the PAT instructions again at home before surgery.   Sending chart to Anesthesia to review due to patient's recent cardiac history and CABGx5.

## 2016-11-12 NOTE — H&P (Signed)
Joseph Campbell, MD   Joseph Code, PA-C [address] [place], [place]  [address]                             [phone]   ORTHOPAEDIC HISTORY & PHYSICAL  Joseph Kidd MRN:  [id number] DOB/SEX:  1951-03-28/male  CHIEF COMPLAINT:  Painful left Knee  HISTORY: Joseph Kidd has been seen on numerous occasions in the past for end-stage osteoarthritis of both knees. He is completed a course of Visco supplementation with only minimal proximally a year ago. He is reached a point where he really is having miserable time with sleeping walking and performing activities of daily living and wishes to discuss knee replacement. Many years ago he had a crush injury to his right leg when he was kicked by a horse. Not really had any problems since that time. He just recently was diagnosed with DVT in his right lower extremity and treated by Dr. Swaziland with the above. He completed the xarelto in early July.   Patient is a 66 y.o. male presented with a history of pain in the left knee for 6 years. Onset of symptoms was gradual starting 6 years ago with rapidly worsening course since that time. Prior procedures on the knee are none. Patient has been treated conservatively with over-the-counter NSAIDs and activity modification. Patient currently rates pain in the knee at 7 out of 10 with activity. There is pain at night. present.  They have been previously treated with: NSAIDS: Ibuprofen, Naprosyn, NSAID with mild improvement  Knee injection with corticosteroid  was performed Knee injection with visco supplementation was performed Medications: NSAID, Steriods with mild improvement  PAST MEDICAL HISTORY: Patient Active Problem List   Diagnosis Date Noted  . S/P CABG x 5   . Coronary artery disease 07/04/2016  . Non-ST elevation (NSTEMI) myocardial infarction (HCC) 07/03/2016  . HTN (hypertension) 07/03/2016  . Hypercholesterolemia 07/03/2016  . NSTEMI (non-ST elevated myocardial infarction)  (HCC) 07/03/2016  . Chest pain 07/03/2016   Past Medical History:  Diagnosis Date  . Coronary artery disease   . DVT (deep venous thrombosis) (HCC)    right lower leg  . Hypercholesterolemia   . Hypertension   . Myocardial infarction (HCC)   . Osteoarthritis    Past Surgical History:  Procedure Laterality Date  . CARDIAC CATHETERIZATION    . CORONARY ARTERY BYPASS GRAFT N/A 07/04/2016   Procedure: CORONARY ARTERY BYPASS GRAFT times five with left internal mammary artery to Left Anterior Descending artery : endoscopic harvest of right saphenous vein with grafts to Diagonal, Right Coronary, Obtuse Marginal 1, Obtuse Marginal 3 Arteries. Insertion of right groin femoral a line;  Surgeon: Delight Ovens, MD;  Location: [facility] OR;  Service: Open Heart Surgery;  Laterality: N/A;  . INTRAOPERATIVE TRANSESOPHAGEAL ECHOCARDIOGRAM N/A 07/04/2016   Procedure: INTRAOPERATIVE TRANSESOPHAGEAL ECHOCARDIOGRAM;  Surgeon: Delight Ovens, MD;  Location: [facility] OR;  Service: Open Heart Surgery;  Laterality: N/A;  . LEFT HEART CATH AND CORONARY ANGIOGRAPHY N/A 07/03/2016   Procedure: Left Heart Cath and Coronary Angiography;  Surgeon: Peter M Swaziland, MD;  Location: [facility] INVASIVE CV LAB;  Service: Cardiovascular;  Laterality: N/A;     MEDICATIONS PRIOR TO ADMISSION: No current facility-administered medications for this encounter.   Current Outpatient Prescriptions:  .  aspirin EC 81 MG tablet, Take 81 mg by mouth daily., Disp: , Rfl:  .  diltiazem (CARDIZEM CD) 120 MG 24  hr capsule, Take 1 capsule (120 mg total) by mouth daily., Disp: 90 capsule, Rfl: 3 .  rosuvastatin (CRESTOR) 10 MG tablet, Take 1 tablet (10 mg total) by mouth daily., Disp: 30 tablet, Rfl: 6 .  zolpidem (AMBIEN) 10 MG tablet, Take 1 tablet by mouth at bedtime., Disp: , Rfl:    ALLERGIES:   Allergies  Allergen Reactions  . Atorvastatin     Severe myalgias  . Chlorhexidine Rash    REVIEW OF SYSTEMS:  Review of Systems  Constitutional:  Negative.   HENT: Negative.   Eyes: Negative.   Respiratory: Negative.   Cardiovascular:       S/p CABG 06/2016  Gastrointestinal: Negative.   Genitourinary: Negative.   Skin: Positive for rash (chlorhexidine).  Neurological: Negative.   Endo/Heme/Allergies: Negative.   Psychiatric/Behavioral: Negative.     FAMILY HISTORY:   Family History  Problem Relation Age of Onset  . Heart disease Mother   . Hypercholesterolemia Mother     SOCIAL HISTORY:   Social History   Occupational History  . retired Copywriter, advertising    Social History Main Topics  . Smoking status: Never Smoker  . Smokeless tobacco: Never Used  . Alcohol use Yes     Comment: very seldom  . Drug use: No  . Sexual activity: Not on file     EXAMINATION:  Vital signs in last 24 hours: BP 136/80   Pulse 82   Temp 97.8 F (36.6 C)   Resp 14   Ht 5\' 11"  (1.803 m)   Wt 232 lb (105.2 kg)   BMI 32.36 kg/m   Physical Exam  Constitutional: He is oriented to person, place, and time. He appears well-developed and well-nourished.  HENT:  Head: Normocephalic and atraumatic.  Eyes: Pupils are equal, round, and reactive to light. Conjunctivae and EOM are normal.  Neck: Neck supple.  Cardiovascular: Normal rate, regular rhythm, normal heart sounds and intact distal pulses.   Pulmonary/Chest: Effort normal and breath sounds normal.  Abdominal: Soft. Bowel sounds are normal.  Neurological: He is alert and oriented to person, place, and time.  Skin: Skin is warm and dry.  Psychiatric: He has a normal mood and affect. His behavior is normal. Judgment and thought content normal.   Left Knee Exam   Tenderness  The patient is experiencing tenderness in the lateral joint line and medial joint line.  Range of Motion  Extension: -5  Flexion: 120   Other  Erythema: absent  Comments:  Crepitance with ROM.  1-2+ effusion.  No warmth or erythema. Pseudolaxity with valgus stressing.  Good endpoint.        Imaging  Review Plain radiographs demonstrate severe degenerative joint disease of the left knee. The overall alignment is mild varus. The bone quality appears to be good for age and reported activity level.  ASSESSMENT: End stage arthritis, left knee  Past Medical History:  Diagnosis Date  . Coronary artery disease   . DVT (deep venous thrombosis) (HCC)    right lower leg  . Hypercholesterolemia   . Hypertension   . Myocardial infarction (HCC)   . Osteoarthritis     PLAN: Plan for left total knee replacement.  The patient history, physical examination and imaging studies are consistent with progressive degenerative joint disease of the left knee. The patient has failed conservative treatment.  The clearance notes were reviewed.  After discussion with the patient it was felt that Total Knee Replacement was indicated. The procedure,  risks, and benefits  of total knee arthroplasty were presented and reviewed. The risks including but not limited to aseptic loosening, infection, blood clots, vascular and nerve injury, stiffness, patella tracking problems and fracture complications among others were discussed. The patient acknowledged the explanation, agreed to proceed with total knee replacement.  Oris DroneBrian D. Aleda Granaetrarca, PA-C [facility] [phone]  11/12/2016 3:12 PM

## 2016-11-13 LAB — URINE CULTURE

## 2016-11-13 NOTE — Progress Notes (Signed)
Anesthesia Chart Review:  Pt is a 66 year old male scheduled for L total knee arthroplasty on 11/25/2016 with Norlene CampbellPeter Whitfield, MD  - PCP is Kathlen BrunswickMarsha White, NP (notes in care everywhere)  - Cardiologist is Peter SwazilandJordan, MD who cleared pt for surgery at last office visit 08/26/16  PMH includes:  CAD (s/p CABG 5 07/04/16, complicated by post-op afib), HTN, hyperlipidemia. DVT (07/28/16; completed course of xarelto 11/02/16.  Never smoker. BMI 32  Medications include: ASA 81 mg, diltiazem, rosuvastatin  BP (!) 145/98   Pulse 77   Temp 36.5 C   Resp 20   Ht 5\' 11"  (1.803 m)   Wt 229 lb 11.2 oz (104.2 kg)   SpO2 98%   BMI 32.04 kg/m   Preoperative labs reviewed.    CXR 08/11/16: Minimal left basilar subsegmental atelectasis and minimal left pleural effusion.  EKG 07/28/16: Sinus rhythm with frequent PVCs in a pattern of bigeminy. T wave abnormality, consider lateral ischemia. Prolonged QT.  TEE 07/04/16:   Right ventricle: Normal wall thickness and ejection fraction. Cavity is moderately dilated.  Tricuspid valve: Trace regurgitation. The tricuspid valve regurgitation jet is central.  Pulmonic valve: Mild regurgitation.  Mitral valve: Dilated mitral annulus. No leaflet thickening and calcification present. Moderate regurgitation.  Aortic valve: The valve is trileaflet. No stenosis. Trace regurgitation. No AV vegetation.  Aorta: The ascending aorta is mildly dilated.  Left atrium: No spontaneous echo contrast.  Left ventricle: Normal cavity size and wall thickness. LV systolic function is moderately reduced with an EF of 35-40%. Wall motion is abnormal. Basal, inferior wall motion is mildly hypokinetic. Mid, inferolateral wall motion is hypokinetic. No thrombus present.  Right ventricle: Normal cavity size. RV systolic function hyperdynamic post op  Carotid duplex 07/03/16:  - The vertebral arteries appear patent with antegrade flow. - Findings consistent with a 1-39 percent stenosis  involving the right internal carotid artery and the left internal carotid artery.  Cardiac cath 07/03/16:  1. Critical 3 vessel obstructive CAD.     - complex 95% proximal LAD, 75-80% diffuse mid LAD    - 90% OM1    - 100% mid LCx with left to left and right to left collaterals    - 95% proximal RCA 2. Good LV function 3. Normal LVEDP - Plan: Consult CT surgery  for CABG.   If no changes, I anticipate pt can proceed with surgery as scheduled.   Rica Mastngela Andrick Rust, FNP-BC [facility]/Anesthesiology Phone: [phone] 11/13/2016 11:15 AM

## 2016-11-17 ENCOUNTER — Telehealth: Payer: Self-pay | Admitting: Cardiology

## 2016-11-17 NOTE — Telephone Encounter (Signed)
Returned call to patient Dr.Jordan advised ok to stop aspirin before surgery.

## 2016-11-17 NOTE — Telephone Encounter (Signed)
New Message   Dr whitfield office told him to stop it:  Pt was told to stop the baby aspirin before his surgery on 7/31, but he want to clarify with you first.

## 2016-11-21 DIAGNOSIS — I10 Essential (primary) hypertension: Secondary | ICD-10-CM | POA: Diagnosis not present

## 2016-11-21 DIAGNOSIS — G4700 Insomnia, unspecified: Secondary | ICD-10-CM | POA: Diagnosis not present

## 2016-11-24 MED ORDER — TRANEXAMIC ACID 1000 MG/10ML IV SOLN
2000.0000 mg | INTRAVENOUS | Status: AC
  Administered 2016-11-25: 2000 mg via TOPICAL
  Filled 2016-11-24: qty 20

## 2016-11-25 ENCOUNTER — Inpatient Hospital Stay (HOSPITAL_COMMUNITY): Payer: Medicare Other | Admitting: Anesthesiology

## 2016-11-25 ENCOUNTER — Inpatient Hospital Stay (HOSPITAL_COMMUNITY)
Admission: RE | Admit: 2016-11-25 | Discharge: 2016-11-27 | DRG: 470 | Disposition: A | Discharge status: Home / Self Care | Payer: Medicare Other | Source: Ambulatory Visit | Attending: Orthopaedic Surgery | Admitting: Orthopaedic Surgery

## 2016-11-25 ENCOUNTER — Inpatient Hospital Stay (HOSPITAL_COMMUNITY): Payer: Medicare Other | Admitting: Emergency Medicine

## 2016-11-25 ENCOUNTER — Encounter (HOSPITAL_COMMUNITY)
Admission: RE | Disposition: A | Discharge status: Home / Self Care | Payer: Self-pay | Source: Ambulatory Visit | Attending: Orthopaedic Surgery

## 2016-11-25 ENCOUNTER — Encounter (HOSPITAL_COMMUNITY): Payer: Self-pay | Admitting: *Deleted

## 2016-11-25 DIAGNOSIS — Z86718 Personal history of other venous thrombosis and embolism: Secondary | ICD-10-CM

## 2016-11-25 DIAGNOSIS — I2510 Atherosclerotic heart disease of native coronary artery without angina pectoris: Secondary | ICD-10-CM | POA: Diagnosis present

## 2016-11-25 DIAGNOSIS — Z96652 Presence of left artificial knee joint: Secondary | ICD-10-CM

## 2016-11-25 DIAGNOSIS — G8918 Other acute postprocedural pain: Secondary | ICD-10-CM | POA: Diagnosis not present

## 2016-11-25 DIAGNOSIS — Z951 Presence of aortocoronary bypass graft: Secondary | ICD-10-CM | POA: Diagnosis not present

## 2016-11-25 DIAGNOSIS — E7800 Pure hypercholesterolemia, unspecified: Secondary | ICD-10-CM | POA: Diagnosis present

## 2016-11-25 DIAGNOSIS — M1712 Unilateral primary osteoarthritis, left knee: Principal | ICD-10-CM | POA: Diagnosis present

## 2016-11-25 DIAGNOSIS — M1710 Unilateral primary osteoarthritis, unspecified knee: Secondary | ICD-10-CM | POA: Diagnosis present

## 2016-11-25 DIAGNOSIS — I10 Essential (primary) hypertension: Secondary | ICD-10-CM | POA: Diagnosis present

## 2016-11-25 DIAGNOSIS — I252 Old myocardial infarction: Secondary | ICD-10-CM

## 2016-11-25 DIAGNOSIS — M179 Osteoarthritis of knee, unspecified: Secondary | ICD-10-CM | POA: Diagnosis present

## 2016-11-25 DIAGNOSIS — I25810 Atherosclerosis of coronary artery bypass graft(s) without angina pectoris: Secondary | ICD-10-CM | POA: Diagnosis not present

## 2016-11-25 HISTORY — PX: TOTAL KNEE ARTHROPLASTY: SHX125

## 2016-11-25 SURGERY — ARTHROPLASTY, KNEE, TOTAL
Anesthesia: Regional | Site: Knee | Laterality: Left

## 2016-11-25 MED ORDER — ONDANSETRON HCL 4 MG PO TABS
4.0000 mg | ORAL_TABLET | Freq: Four times a day (QID) | ORAL | Status: DC | PRN
  Administered 2016-11-27: 4 mg via ORAL
  Filled 2016-11-25: qty 1

## 2016-11-25 MED ORDER — HYDROMORPHONE HCL 1 MG/ML IJ SOLN: 0.5000 mg | INTRAMUSCULAR | Status: DC | PRN

## 2016-11-25 MED ORDER — CEFAZOLIN SODIUM-DEXTROSE 2-4 GM/100ML-% IV SOLN
2.0000 g | INTRAVENOUS | Status: AC
  Administered 2016-11-25 (×2): 2 g via INTRAVENOUS
  Filled 2016-11-25: qty 100

## 2016-11-25 MED ORDER — DEXAMETHASONE SODIUM PHOSPHATE 10 MG/ML IJ SOLN
INTRAMUSCULAR | Status: DC | PRN
  Administered 2016-11-25: 10 mg via INTRAVENOUS

## 2016-11-25 MED ORDER — MIDAZOLAM HCL 2 MG/2ML IJ SOLN
2.0000 mg | Freq: Once | INTRAMUSCULAR | Status: AC
  Administered 2016-11-25: 2 mg via INTRAVENOUS

## 2016-11-25 MED ORDER — BUPIVACAINE-EPINEPHRINE 0.25% -1:200000 IJ SOLN
INTRAMUSCULAR | Status: DC | PRN
  Administered 2016-11-25: 30 mL

## 2016-11-25 MED ORDER — SODIUM CHLORIDE 0.9 % IV SOLN
INTRAVENOUS | Status: DC
  Administered 2016-11-25: 15:00:00 via INTRAVENOUS

## 2016-11-25 MED ORDER — ROSUVASTATIN CALCIUM 10 MG PO TABS
10.0000 mg | ORAL_TABLET | Freq: Every day | ORAL | Status: DC
  Administered 2016-11-25 – 2016-11-26 (×2): 10 mg via ORAL
  Filled 2016-11-25 (×2): qty 1

## 2016-11-25 MED ORDER — PHENOL 1.4 % MT LIQD: 1.0000 | OROMUCOSAL | Status: DC | PRN

## 2016-11-25 MED ORDER — BUPIVACAINE-EPINEPHRINE 0.25% -1:200000 IJ SOLN
INTRAMUSCULAR | Status: AC
  Filled 2016-11-25: qty 1

## 2016-11-25 MED ORDER — CEFAZOLIN SODIUM-DEXTROSE 2-4 GM/100ML-% IV SOLN
2.0000 g | Freq: Four times a day (QID) | INTRAVENOUS | Status: AC
  Administered 2016-11-25 (×2): 2 g via INTRAVENOUS
  Filled 2016-11-25 (×2): qty 100

## 2016-11-25 MED ORDER — SODIUM CHLORIDE 0.9 % IR SOLN
Status: DC | PRN
  Administered 2016-11-25: 3000 mL

## 2016-11-25 MED ORDER — ACETAMINOPHEN 10 MG/ML IV SOLN
1000.0000 mg | Freq: Four times a day (QID) | INTRAVENOUS | Status: AC
  Administered 2016-11-25 – 2016-11-26 (×4): 1000 mg via INTRAVENOUS
  Filled 2016-11-25 (×4): qty 100

## 2016-11-25 MED ORDER — OXYCODONE HCL 5 MG PO TABS
5.0000 mg | ORAL_TABLET | ORAL | Status: DC | PRN
  Administered 2016-11-26: 5 mg via ORAL
  Administered 2016-11-26 – 2016-11-27 (×3): 10 mg via ORAL
  Filled 2016-11-25: qty 1
  Filled 2016-11-25 (×3): qty 2

## 2016-11-25 MED ORDER — ONDANSETRON HCL 4 MG/2ML IJ SOLN
INTRAMUSCULAR | Status: DC | PRN
  Administered 2016-11-25: 4 mg via INTRAVENOUS

## 2016-11-25 MED ORDER — POVIDONE-IODINE 10 % EX SWAB: 2.0000 "application " | Freq: Once | CUTANEOUS | Status: DC

## 2016-11-25 MED ORDER — BUPIVACAINE IN DEXTROSE 0.75-8.25 % IT SOLN
INTRATHECAL | Status: DC | PRN
  Administered 2016-11-25: 2 mL via INTRATHECAL

## 2016-11-25 MED ORDER — FENTANYL CITRATE (PF) 100 MCG/2ML IJ SOLN: 25.0000 ug | INTRAMUSCULAR | Status: DC | PRN

## 2016-11-25 MED ORDER — PROPOFOL 10 MG/ML IV BOLUS
INTRAVENOUS | Status: AC
  Filled 2016-11-25: qty 20

## 2016-11-25 MED ORDER — LACTATED RINGERS IV SOLN
INTRAVENOUS | Status: DC
  Administered 2016-11-25 (×3): via INTRAVENOUS

## 2016-11-25 MED ORDER — METOCLOPRAMIDE HCL 5 MG/ML IJ SOLN
5.0000 mg | Freq: Three times a day (TID) | INTRAMUSCULAR | Status: DC | PRN
Start: 2016-11-25 — End: 2016-11-27

## 2016-11-25 MED ORDER — ALUM & MAG HYDROXIDE-SIMETH 200-200-20 MG/5ML PO SUSP: 30.0000 mL | ORAL | Status: DC | PRN

## 2016-11-25 MED ORDER — EPHEDRINE 5 MG/ML INJ
INTRAVENOUS | Status: AC
Start: 2016-11-25 — End: ?
  Filled 2016-11-25: qty 10

## 2016-11-25 MED ORDER — METHOCARBAMOL 500 MG PO TABS
500.0000 mg | ORAL_TABLET | Freq: Four times a day (QID) | ORAL | Status: DC | PRN
  Administered 2016-11-26 – 2016-11-27 (×2): 500 mg via ORAL
  Filled 2016-11-25 (×2): qty 1

## 2016-11-25 MED ORDER — ZOLPIDEM TARTRATE 5 MG PO TABS
5.0000 mg | ORAL_TABLET | Freq: Every day | ORAL | Status: DC
  Administered 2016-11-25 – 2016-11-26 (×2): 5 mg via ORAL
  Filled 2016-11-25 (×2): qty 1

## 2016-11-25 MED ORDER — BISACODYL 10 MG RE SUPP: 10.0000 mg | Freq: Every day | RECTAL | Status: DC | PRN

## 2016-11-25 MED ORDER — DIPHENHYDRAMINE HCL 12.5 MG/5ML PO ELIX: 12.5000 mg | ORAL_SOLUTION | ORAL | Status: DC | PRN

## 2016-11-25 MED ORDER — MIDAZOLAM HCL 2 MG/2ML IJ SOLN
INTRAMUSCULAR | Status: AC
  Administered 2016-11-25: 2 mg via INTRAVENOUS
  Filled 2016-11-25: qty 2

## 2016-11-25 MED ORDER — KETOROLAC TROMETHAMINE 15 MG/ML IJ SOLN
15.0000 mg | Freq: Four times a day (QID) | INTRAMUSCULAR | Status: AC
  Administered 2016-11-25 – 2016-11-26 (×4): 15 mg via INTRAVENOUS
  Filled 2016-11-25 (×4): qty 1

## 2016-11-25 MED ORDER — ONDANSETRON HCL 4 MG/2ML IJ SOLN: 4.0000 mg | Freq: Four times a day (QID) | INTRAMUSCULAR | Status: DC | PRN

## 2016-11-25 MED ORDER — ONDANSETRON HCL 4 MG/2ML IJ SOLN: 4.0000 mg | Freq: Once | INTRAMUSCULAR | Status: DC | PRN

## 2016-11-25 MED ORDER — MENTHOL 3 MG MT LOZG: 1.0000 | LOZENGE | OROMUCOSAL | Status: DC | PRN

## 2016-11-25 MED ORDER — RIVAROXABAN 10 MG PO TABS
10.0000 mg | ORAL_TABLET | Freq: Every day | ORAL | Status: DC
  Administered 2016-11-26 – 2016-11-27 (×2): 10 mg via ORAL
  Filled 2016-11-25 (×2): qty 1

## 2016-11-25 MED ORDER — DOCUSATE SODIUM 100 MG PO CAPS
100.0000 mg | ORAL_CAPSULE | Freq: Two times a day (BID) | ORAL | Status: DC
  Administered 2016-11-26 – 2016-11-27 (×3): 100 mg via ORAL
  Filled 2016-11-25 (×4): qty 1

## 2016-11-25 MED ORDER — ACETAMINOPHEN 10 MG/ML IV SOLN
1000.0000 mg | Freq: Four times a day (QID) | INTRAVENOUS | Status: DC
  Administered 2016-11-25: 1000 mg via INTRAVENOUS
  Filled 2016-11-25: qty 100

## 2016-11-25 MED ORDER — METHOCARBAMOL 1000 MG/10ML IJ SOLN
500.0000 mg | Freq: Four times a day (QID) | INTRAVENOUS | Status: DC | PRN
  Filled 2016-11-25: qty 5

## 2016-11-25 MED ORDER — METOCLOPRAMIDE HCL 5 MG PO TABS: 5.0000 mg | ORAL_TABLET | Freq: Three times a day (TID) | ORAL | Status: DC | PRN

## 2016-11-25 MED ORDER — POLYETHYLENE GLYCOL 3350 17 G PO PACK: 17.0000 g | PACK | Freq: Every day | ORAL | Status: DC | PRN

## 2016-11-25 MED ORDER — ROPIVACAINE HCL 5 MG/ML IJ SOLN
INTRAMUSCULAR | Status: DC | PRN
  Administered 2016-11-25: 30 mL via PERINEURAL

## 2016-11-25 MED ORDER — PHENYLEPHRINE HCL 10 MG/ML IJ SOLN
INTRAVENOUS | Status: DC | PRN
  Administered 2016-11-25: 20 ug/min via INTRAVENOUS

## 2016-11-25 MED ORDER — LIDOCAINE HCL (CARDIAC) 20 MG/ML IV SOLN
INTRAVENOUS | Status: DC | PRN
  Administered 2016-11-25: 30 mg via INTRAVENOUS

## 2016-11-25 MED ORDER — POVIDONE-IODINE 7.5 % EX SOLN: Freq: Once | CUTANEOUS | Status: DC

## 2016-11-25 MED ORDER — SODIUM CHLORIDE 0.9 % IV SOLN: INTRAVENOUS | Status: DC

## 2016-11-25 MED ORDER — FENTANYL CITRATE (PF) 100 MCG/2ML IJ SOLN
INTRAMUSCULAR | Status: AC
  Filled 2016-11-25: qty 2

## 2016-11-25 MED ORDER — MAGNESIUM CITRATE PO SOLN: 1.0000 | Freq: Once | ORAL | Status: DC | PRN

## 2016-11-25 MED ORDER — PROPOFOL 500 MG/50ML IV EMUL
INTRAVENOUS | Status: DC | PRN
  Administered 2016-11-25: 75 ug/kg/min via INTRAVENOUS

## 2016-11-25 MED ORDER — 0.9 % SODIUM CHLORIDE (POUR BTL) OPTIME
TOPICAL | Status: DC | PRN
  Administered 2016-11-25: 1000 mL

## 2016-11-25 SURGICAL SUPPLY — 65 items
BAG DECANTER FOR FLEXI CONT (MISCELLANEOUS) ×2 IMPLANT
BANDAGE ESMARK 6X9 LF (GAUZE/BANDAGES/DRESSINGS) ×1 IMPLANT
BLADE SAGITTAL 25.0X1.19X90 (BLADE) ×2 IMPLANT
BLADE SURG 10 STRL SS (BLADE) ×2 IMPLANT
BNDG ESMARK 6X9 LF (GAUZE/BANDAGES/DRESSINGS) ×2
BOWL SMART MIX CTS (DISPOSABLE) ×2 IMPLANT
CAP KNEE TOTAL 3 SIGMA ×2 IMPLANT
CEMENT HV SMART SET (Cement) ×4 IMPLANT
COVER SURGICAL LIGHT HANDLE (MISCELLANEOUS) ×2 IMPLANT
CUFF TOURNIQUET SINGLE 34IN LL (TOURNIQUET CUFF) ×2 IMPLANT
DECANTER SPIKE VIAL GLASS SM (MISCELLANEOUS) ×2 IMPLANT
DRAPE EXTREMITY T 121X128X90 (DRAPE) IMPLANT
DRAPE HALF SHEET 40X57 (DRAPES) ×4 IMPLANT
DRSG ADAPTIC 3X8 NADH LF (GAUZE/BANDAGES/DRESSINGS) IMPLANT
DRSG EMULSION OIL 3X3 NADH (GAUZE/BANDAGES/DRESSINGS) ×2 IMPLANT
DRSG PAD ABDOMINAL 8X10 ST (GAUZE/BANDAGES/DRESSINGS) ×4 IMPLANT
DURAPREP 26ML APPLICATOR (WOUND CARE) ×4 IMPLANT
ELECT CAUTERY BLADE 6.4 (BLADE) ×4 IMPLANT
ELECT REM PT RETURN 9FT ADLT (ELECTROSURGICAL) ×2
ELECTRODE REM PT RTRN 9FT ADLT (ELECTROSURGICAL) ×1 IMPLANT
EVACUATOR 1/8 PVC DRAIN (DRAIN) ×2 IMPLANT
FACESHIELD WRAPAROUND (MASK) ×4 IMPLANT
GAUZE SPONGE 4X4 12PLY STRL (GAUZE/BANDAGES/DRESSINGS) IMPLANT
GAUZE SPONGE 4X4 12PLY STRL LF (GAUZE/BANDAGES/DRESSINGS) ×2 IMPLANT
GLOVE BIOGEL PI IND STRL 6.5 (GLOVE) ×3 IMPLANT
GLOVE BIOGEL PI IND STRL 7.0 (GLOVE) ×3 IMPLANT
GLOVE BIOGEL PI IND STRL 8 (GLOVE) ×1 IMPLANT
GLOVE BIOGEL PI IND STRL 8.5 (GLOVE) ×1 IMPLANT
GLOVE BIOGEL PI INDICATOR 6.5 (GLOVE) ×3
GLOVE BIOGEL PI INDICATOR 7.0 (GLOVE) ×3
GLOVE BIOGEL PI INDICATOR 8 (GLOVE) ×1
GLOVE BIOGEL PI INDICATOR 8.5 (GLOVE) ×1
GLOVE ECLIPSE 8.0 STRL XLNG CF (GLOVE) ×4 IMPLANT
GLOVE SURG ORTHO 8.5 STRL (GLOVE) ×4 IMPLANT
GOWN STRL REUS W/ TWL LRG LVL3 (GOWN DISPOSABLE) ×3 IMPLANT
GOWN STRL REUS W/TWL 2XL LVL3 (GOWN DISPOSABLE) ×2 IMPLANT
GOWN STRL REUS W/TWL LRG LVL3 (GOWN DISPOSABLE) ×3
HANDPIECE INTERPULSE COAX TIP (DISPOSABLE) ×1
KIT BASIN OR (CUSTOM PROCEDURE TRAY) ×2 IMPLANT
KIT ROOM TURNOVER OR (KITS) ×2 IMPLANT
MANIFOLD NEPTUNE II (INSTRUMENTS) ×2 IMPLANT
NEEDLE 22X1 1/2 (OR ONLY) (NEEDLE) ×2 IMPLANT
NS IRRIG 1000ML POUR BTL (IV SOLUTION) ×2 IMPLANT
PACK TOTAL JOINT (CUSTOM PROCEDURE TRAY) ×2 IMPLANT
PAD ARMBOARD 7.5X6 YLW CONV (MISCELLANEOUS) ×4 IMPLANT
PAD CAST 4YDX4 CTTN HI CHSV (CAST SUPPLIES) ×1 IMPLANT
PADDING CAST COTTON 4X4 STRL (CAST SUPPLIES) ×1
PADDING CAST COTTON 6X4 STRL (CAST SUPPLIES) ×2 IMPLANT
SET HNDPC FAN SPRY TIP SCT (DISPOSABLE) ×1 IMPLANT
STAPLER VISISTAT 35W (STAPLE) ×2 IMPLANT
SUCTION FRAZIER HANDLE 10FR (MISCELLANEOUS) ×1
SUCTION TUBE FRAZIER 10FR DISP (MISCELLANEOUS) ×1 IMPLANT
SURGIFLO W/THROMBIN 8M KIT (HEMOSTASIS) IMPLANT
SUT BONE WAX W31G (SUTURE) ×2 IMPLANT
SUT ETHIBOND NAB CT1 #1 30IN (SUTURE) ×8 IMPLANT
SUT MNCRL AB 3-0 PS2 18 (SUTURE) ×2 IMPLANT
SUT VIC AB 0 CT1 27 (SUTURE) ×1
SUT VIC AB 0 CT1 27XBRD ANBCTR (SUTURE) ×1 IMPLANT
SUT VIC AB 2-0 CT1 27 (SUTURE) ×2
SUT VIC AB 2-0 CT1 TAPERPNT 27 (SUTURE) ×2 IMPLANT
SYR CONTROL 10ML LL (SYRINGE) ×2 IMPLANT
TOWEL OR 17X24 6PK STRL BLUE (TOWEL DISPOSABLE) ×2 IMPLANT
TOWEL OR 17X26 10 PK STRL BLUE (TOWEL DISPOSABLE) ×2 IMPLANT
TRAY FOLEY BAG SILVER LF 16FR (SET/KITS/TRAYS/PACK) IMPLANT
WRAP KNEE MAXI GEL POST OP (GAUZE/BANDAGES/DRESSINGS) ×2 IMPLANT

## 2016-11-25 NOTE — H&P (Signed)
The recent History & Physical has been reviewed. I have personally examined the patient today. There is no interval change to the documented History & Physical. The patient would like to proceed with the procedure.  Joseph Kidd W Mikias Lanz 11/25/2016,  9:36 AM

## 2016-11-25 NOTE — Op Note (Signed)
NAME:  Joseph Kidd, Joseph Kidd                    ACCOUNT NO.:  MEDICAL RECORD NO.:  [id number]  LOCATION:                                 FACILITY:  PHYSICIAN:  Vonna Kotyk. Durward Fortes, M.D.    DATE OF BIRTH:  DATE OF PROCEDURE:  11/25/2016 DATE OF DISCHARGE:                              OPERATIVE REPORT   PREOPERATIVE DIAGNOSIS:  End-stage osteoarthritis, left knee.  POSTOPERATIVE DIAGNOSIS:  End-stage osteoarthritis, left knee.  PROCEDURE:  Left total knee replacement.  SURGEON:  Vonna Kotyk. Durward Fortes, M.D..  ASSISTANT:  Biagio Borg, PA-C.  He was present throughout the operative procedure to ensure its timely completion.  ANESTHESIA:  Spinal with IV sedation.  COMPLICATIONS:  None.  COMPONENTS:  DePuy LCS large femoral component, a #4 rotating keeled tibial tray with a 12.5 mm polyethylene bridging bearing, a metal back 3- peg rotating patella.  Components were secured with polymethylmethacrylate.  DESCRIPTION OF PROCEDURE:  Joseph Kidd was met in the holding area, identified the left knee as the appropriate operative site and marked it accordingly.  The patient was then transported to room #7.  Spinal anesthesia was performed by Anesthesia under IV sedation.  The patient was then placed comfortably supine on the operating room table.  A tourniquet was applied to the left thigh.  The left lower extremity was then scrubbed with Betadine scrub and DuraPrep x2 from the tourniquet to the tips of the toes.  Sterile draping was performed.  A time-out was called.  The extremity was then elevated and Esmarch exsanguinated with a proximal tourniquet at 350 mmHg.  A midline longitudinal incision was made centered about the patella via sharp dissection and incision carried down through subcutaneous tissue. First layer of capsule incised in the midline and medial parapatellar incision was made with the Bovie.  The joint was entered.  There was a large clear yellow joint effusion of over 30  mL.  The patella was everted 180 degrees laterally and the knee flexed to 90 degrees.  There was an abundant amount of beefy red hypertrophic synovium.  Synovium samples were sent to pathology for evaluation.  Synovectomy was performed.  We measured a large femoral component.  First, bony cut was made transversely in the proximal tibia with a 7-degree angle of declination. After each bony cut on the tibia and the femur, we checked our alignment with the external jigs.  Subsequent cuts were then made on the femur using the large femoral jigs.  I used a 4-degree distal femoral valgus cut.  Laminar spreaders were inserted along the medial and lateral compartment.  I removed medial and lateral menisci, ACL, and PCL.  There were no loose bodies.  A popliteal cyst was identified medially and the visible portion of the cyst was Bovie coagulated.  Osteophytes were removed from the posterior femoral condyles using a three-quarter-inch curved osteotome.  I measured flexion and extension gaps symmetrically at 12.5 mm.  MCL and LCL remained intact throughout the procedure.  Final cuts were then made on the femur using the large femoral jig for the tapering cuts and to obtain the center hole.  Retractors were then placed about the  tibia, was advanced anteriorly, and measured a #4 tibial tray.  This was pinned in place.  We checked our external alignment.  The center hole was then made followed by the keeled cut.  With the tibial tray in place, the 12.5 mm trial polyethylene bridging bearing was inserted followed by the trial large femoral component.  The entire construct was reduced.  We had full extension and flexion over 115 degrees without any instability.  The patella was prepared by removing approximately 8 mm of bone leaving 13 mm patellar thickness.  Three holes were then made.  The trial patella inserted and reduced and through a full range of motion remained stable.  The trial  components were removed.  We copiously irrigated the joint with saline solution.  The final components were then impacted with polymethylmethacrylate.  We initially applied the #4 tibial tray followed by the trial polyethylene 12.5 mm bridging bearing and then the final femoral component.  They were impacted and then the joint was reduced.  Obvious extraneous methacrylate was removed.  Patella was applied with methacrylate and a patellar clamp.  At approximately 16 minutes, the methacrylate had matured, during which time we injected the joint with 0.25% Marcaine with epinephrine.  The tourniquet was deflated at 74 minutes.  We had immediate capillary refill to the joint surface.  We applied topical tranexamic acid with compression for approximately 5 minutes.  Any remaining bleeding was controlled with the Bovie.  The trial tibia was then removed and any further hardened extraneous methacrylate from either the femur or the tibia was removed with an osteotome.  The final 12.5 mm polyethylene bridging bearing was inserted and reduced and again through a full range of motion, we had perfect stability.  The deep capsule was then closed with a running #1 Ethibond, superficial capsule with 0 running Vicryl, subcu with 2-0 Vicryl and 3-0 Monocryl. Skin closed with skin clips.  A sterile bulky dressing was applied followed by the patient's support stocking.  The patient was then transported to the postanesthesia recovery room without complication.  We did insert a Foley at the end of the procedure with clear yellow urine.     Vonna Kotyk. Durward Fortes, M.D.     PWW/MEDQ  D:  11/25/2016  T:  11/25/2016  Job:  [id number]

## 2016-11-25 NOTE — Anesthesia Procedure Notes (Signed)
Anesthesia Regional Block: Adductor canal block   Pre-Anesthetic Checklist: ,, timeout performed, Correct Patient, Correct Site, Correct Laterality, Correct Procedure,, site marked, risks and benefits discussed, Surgical consent,  Pre-op evaluation,  At surgeon's request and post-op pain management  Laterality: Left  Prep: Dura Prep       Needles:  Injection technique: Single-shot  Needle Type: Echogenic Stimulator Needle     Needle Length: 9cm  Needle Gauge: 21     Additional Needles:   Procedures: ultrasound guided,,,,,,,,  Narrative:  Start time: 11/25/2016 9:10 AM End time: 11/25/2016 9:20 AM Injection made incrementally with aspirations every 5 mL.  Performed by: Personally  Anesthesiologist: Karna ChristmasELLENDER, RYAN P  Additional Notes: Functioning IV was confirmed and monitors were applied.  A 90mm 21ga Arrow echogenic stimulator needle was used. Sterile prep,hand hygiene and sterile gloves were used.  Negative aspiration and negative test dose prior to incremental administration of local anesthetic. The patient tolerated the procedure well.

## 2016-11-25 NOTE — Anesthesia Preprocedure Evaluation (Addendum)
Anesthesia Evaluation  Patient identified by MRN, date of birth, ID band Patient awake    Reviewed: Allergy & Precautions, NPO status , Patient's Chart, lab work & pertinent test results  Airway Mallampati: II  TM Distance: >3 FB Neck ROM: Full    Dental  (+) Poor Dentition, Missing   Pulmonary neg pulmonary ROS,    Pulmonary exam normal breath sounds clear to auscultation       Cardiovascular hypertension, + CAD, + Past MI and + CABG  Normal cardiovascular exam Rhythm:Regular Rate:Normal  ECG: SR, PVC's rate 70  ECHO: Right ventricle: Normal wall thickness and ejection fraction. Cavity is moderately dilated. Tricuspid valve: Trace regurgitation. The tricuspid valve regurgitation jet is central. Pulmonic valve: Mild regurgitation. Mitral valve: Dilated mitral annulus. No leaflet thickening and calcification present. Moderate regurgitation. Aortic valve: The valve is trileaflet. No stenosis. Trace regurgitation. No AV vegetation. Aorta: The ascending aorta is mildly dilated. Left atrium: No spontaneous echo contrast. Left ventricle: Normal cavity size and wall thickness. LV systolic function is moderately reduced with an EF of 35-40%. Wall motion is abnormal. Basal, inferior wall motion is mildly hypokinetic. Mid, inferolateral wall motion is hypokinetic. No thrombus present. Right ventricle: Normal cavity size. RV systolic function hyperdynamic post op  PCP is Kathlen BrunswickMarsha White, NP (notes in care everywhere)  Cardiologist is Peter SwazilandJordan, MD who cleared pt for surgery at last office visit 08/26/16   Neuro/Psych negative neurological ROS  negative psych ROS   GI/Hepatic negative GI ROS, Neg liver ROS,   Endo/Other  negative endocrine ROS  Renal/GU negative Renal ROS     Musculoskeletal  (+) Arthritis , Osteoarthritis,    Abdominal (+) - obese,   Peds  Hematology negative hematology ROS (+)   Anesthesia Other Findings   Reproductive/Obstetrics                            Anesthesia Physical Anesthesia Plan  ASA: II  Anesthesia Plan: Spinal and Regional   Post-op Pain Management:  Regional for Post-op pain   Induction: Intravenous  PONV Risk Score and Plan: 1 and Ondansetron, Dexamethasone, Propofol infusion and Midazolam  Airway Management Planned:   Additional Equipment:   Intra-op Plan:   Post-operative Plan:   Informed Consent: I have reviewed the patients History and Physical, chart, labs and discussed the procedure including the risks, benefits and alternatives for the proposed anesthesia with the patient or authorized representative who has indicated his/her understanding and acceptance.   Dental advisory given  Plan Discussed with: CRNA  Anesthesia Plan Comments:         Anesthesia Quick Evaluation

## 2016-11-25 NOTE — Anesthesia Procedure Notes (Signed)
Spinal  Patient location during procedure: OR Start time: 11/25/2016 10:20 AM End time: 11/25/2016 10:22 AM Staffing Anesthesiologist: Suella Broad D Performed: anesthesiologist  Preanesthetic Checklist Completed: patient identified, site marked, surgical consent, pre-op evaluation, timeout performed, IV checked, risks and benefits discussed and monitors and equipment checked Spinal Block Patient position: sitting Prep: Betadine Patient monitoring: heart rate, continuous pulse ox, blood pressure and cardiac monitor Approach: midline Location: L4-5 Injection technique: single-shot Needle Needle type: Introducer and Pencan  Needle gauge: 24 G Needle length: 9 cm Additional Notes Negative paresthesia. Negative blood return. Positive free-flowing CSF. Expiration date of kit checked and confirmed. Patient tolerated procedure well, without complications.

## 2016-11-25 NOTE — Anesthesia Procedure Notes (Signed)
Date/Time: 11/25/2016 10:30 AM Performed by: Coralee RudFLORES, Joseph Walkup Pre-anesthesia Checklist: Patient identified, Emergency Drugs available, Suction available and Patient being monitored Patient Re-evaluated:Patient Re-evaluated prior to induction Oxygen Delivery Method: Simple face mask Induction Type: IV induction Ventilation: Oral airway inserted - appropriate to patient size Placement Confirmation: positive ETCO2 and breath sounds checked- equal and bilateral

## 2016-11-25 NOTE — Anesthesia Postprocedure Evaluation (Signed)
Anesthesia Post Note  Patient: Joseph Kidd  Procedure(s) Performed: Procedure(s) (LRB): LEFT TOTAL KNEE ARTHROPLASTY (Left)     Patient location during evaluation: PACU Anesthesia Type: Regional Level of consciousness: oriented and awake and alert Pain management: pain level controlled Vital Signs Assessment: post-procedure vital signs reviewed and stable Respiratory status: spontaneous breathing, respiratory function stable and patient connected to nasal cannula oxygen Cardiovascular status: blood pressure returned to baseline and stable Postop Assessment: no headache, no backache, spinal receding and patient able to bend at knees Anesthetic complications: no    Last Vitals:  Vitals:   11/25/16 1353 11/25/16 1430  BP: (!) 135/92 (!) 153/92  Pulse: 60 60  Resp: 17 18  Temp: (!) 36.3 C 36.5 C    Last Pain:  Vitals:   11/25/16 1430  TempSrc: Oral  PainSc:                  Joseph Kidd

## 2016-11-25 NOTE — Transfer of Care (Signed)
Immediate Anesthesia Transfer of Care Note  Patient: Joseph Kidd  Procedure(s) Performed: Procedure(s): LEFT TOTAL KNEE ARTHROPLASTY (Left)  Patient Location: PACU  Anesthesia Type:Spinal  Level of Consciousness: awake, alert  and oriented  Airway & Oxygen Therapy: Patient Spontanous Breathing and Patient connected to nasal cannula oxygen  Post-op Assessment: Report given to RN, Post -op Vital signs reviewed and stable and Patient moving all extremities  Post vital signs: Reviewed and stable  Last Vitals:  Vitals:   11/25/16 0920 11/25/16 1241  BP:  111/83  Pulse: 65 66  Resp: 18 19  Temp:  (!) 36.3 C    Last Pain:  Vitals:   11/25/16 1241  TempSrc:   PainSc: (P) 0-No pain         Complications: No apparent anesthesia complications

## 2016-11-25 NOTE — Op Note (Signed)
PATIENT ID:      Blanche Eastlan M Moxon  MRN:     [id number] DOB/AGE:    67/17/1952 / 67 y.o.       OPERATIVE REPORT    DATE OF PROCEDURE:  11/25/2016       PREOPERATIVE DIAGNOSIS:END STAGE   LEFT KNEE OSTEOARTHRITIS                                                       Estimated body mass index is 32.36 kg/m as calculated from the following:   Height as of 11/12/16: 5\' 11"  (1.803 m).   Weight as of this encounter: 232 lb (105.2 kg).     POSTOPERATIVE DIAGNOSIS:END STAGE   LEFT KNEE OSTEOARTHRITIS                                                                     Estimated body mass index is 32.36 kg/m as calculated from the following:   Height as of 11/12/16: 5\' 11"  (1.803 m).   Weight as of this encounter: 232 lb (105.2 kg).     PROCEDURE:  Procedure(s): LEFT TOTAL KNEE ARTHROPLASTY      SURGEON:  Norlene CampbellPeter Mattia Liford, MD    ASSISTANT:   Jacqualine CodeBrian Petrarca, PA-C   (Present and scrubbed throughout the case, critical for assistance with exposure, retraction, instrumentation, and closure.)          ANESTHESIA: spinal and IV sedation     DRAINS: HEMOVAC DRAIN IN LEFT KNEE CLAMPED :      TOURNIQUET TIME:  Total Tourniquet Time Documented: Thigh (Left) - 75 minutes Total: Thigh (Left) - 75 minutes     COMPLICATIONS:  None   CONDITION:  stable  PROCEDURE IN DETAIL: [id number]   Claude Mangeseter W Trayden Brandy 11/25/2016, 12:11 PM

## 2016-11-25 NOTE — Evaluation (Signed)
Physical Therapy Evaluation Patient Details Name: Joseph Kidd MRN: [id number] DOB: 03/02/1951 Today's Date: 11/25/2016   History of Present Illness  Pt is a 66 y/o male s/p L TKA. PMH includes HTN, DVT, NSTEMI s/p CABG X 5, and CAD.   Clinical Impression  Pt s/p surgery above with deficits below. PTA, pt independent with functional mobility. Upon eval, pt limited by post op pain and weakness, as well as, decreased balance. Pt also reporting increased pain at IV site with use of RW, therefore ambulation limited. Notified RN and RN to address. Required min to min guard assist for functional mobility. Reports he will have assist 24/7 at home and has all DME. Follow up recommendations per MD arrangements. Will continue to follow acutely to maximize functional mobility independence and safety.     Follow Up Recommendations DC plan and follow up therapy as arranged by surgeon;Supervision/Assistance - 24 hour    Equipment Recommendations  None recommended by PT (has all DME )    Recommendations for Other Services       Precautions / Restrictions Precautions Precautions: Knee;Other (comment) Precaution Booklet Issued: Yes (comment) Precaution Comments: Reviewed supine ther ex with pt.  Restrictions Weight Bearing Restrictions: Yes LLE Weight Bearing: Partial weight bearing LLE Partial Weight Bearing Percentage or Pounds: 50      Mobility  Bed Mobility Overal bed mobility: Needs Assistance Bed Mobility: Supine to Sit     Supine to sit: Min assist     General bed mobility comments: Min A for LLE assist. Use of bed rails and elevated HOB.   Transfers Overall transfer level: Needs assistance Equipment used: Rolling walker (2 wheeled) Transfers: Sit to/from Stand Sit to Stand: Min assist         General transfer comment: Min A for lift assist and steadying upon standing. Verbal cues for safe hand placement and foot positioning.   Ambulation/Gait Ambulation/Gait assistance:  Min guard Ambulation Distance (Feet): 10 Feet Assistive device: Rolling walker (2 wheeled) Gait Pattern/deviations: Step-to pattern;Decreased step length - right;Decreased step length - left;Decreased weight shift to left;Antalgic Gait velocity: Decreased Gait velocity interpretation: Below normal speed for age/gender General Gait Details: Slow, antalgic gait secondary to post op pain and weakness. Verbal cues for sequencing with LE and to use UE to maintain PWB. Pt reporting increased pain at IV site during ambulation due to reliance on UE, and only able to tolerate short distance. Notified RN about IV.   Stairs            Wheelchair Mobility    Modified Rankin (Stroke Patients Only)       Balance Overall balance assessment: Needs assistance Sitting-balance support: No upper extremity supported;Feet supported Sitting balance-Leahy Scale: Good     Standing balance support: Bilateral upper extremity supported;During functional activity Standing balance-Leahy Scale: Poor Standing balance comment: Reliant on BUE support on RW.                              Pertinent Vitals/Pain Pain Assessment: 0-10 Pain Score: 2  Pain Location: L knee  Pain Descriptors / Indicators: Aching;Operative site guarding;Sore Pain Intervention(s): Limited activity within patient's tolerance;Monitored during session;Repositioned    Home Living Family/patient expects to be discharged to:: Private residence Living Arrangements: Spouse/significant other Available Help at Discharge: Family;Available 24 hours/day Type of Home: House Home Access: Level entry     Home Layout: Two level;Laundry or work area in [employer]basement Home Equipment: Environmental consultantWalker -  2 wheels;Other (comment);Tub bench;Grab bars - tub/shower (CPM )      Prior Function Level of Independence: Independent               Hand Dominance   Dominant Hand: Left    Extremity/Trunk Assessment   Upper Extremity  Assessment Upper Extremity Assessment: Defer to OT evaluation    Lower Extremity Assessment Lower Extremity Assessment: LLE deficits/detail LLE Deficits / Details: Sensory in tact. Able to perform exercises below. Deficits consistent with post op pain and weakness.     Cervical / Trunk Assessment Cervical / Trunk Assessment: Normal  Communication   Communication: No difficulties  Cognition Arousal/Alertness: Awake/alert Behavior During Therapy: WFL for tasks assessed/performed Overall Cognitive Status: Within Functional Limits for tasks assessed                                        General Comments General comments (skin integrity, edema, etc.): Pt's family present throughout session.     Exercises Total Joint Exercises Ankle Circles/Pumps: AROM;Both;Supine;15 reps Quad Sets: AROM;Left;Supine;15 reps Towel Squeeze: AROM;Both;Supine;15 reps Short Arc Quad: AROM;Left;15 reps;Supine Heel Slides: AROM;10 reps;Supine;Left Hip ABduction/ADduction: AROM;Left;15 reps;Supine   Assessment/Plan    PT Assessment Patient needs continued PT services  PT Problem List Decreased strength;Decreased range of motion;Decreased activity tolerance;Decreased balance;Decreased mobility;Decreased knowledge of use of DME;Decreased knowledge of precautions;Pain       PT Treatment Interventions DME instruction;Gait training;Stair training;Functional mobility training;Therapeutic activities;Therapeutic exercise;Balance training;Neuromuscular re-education;Patient/family education    PT Goals (Current goals can be found in the Care Plan section)  Acute Rehab PT Goals Patient Stated Goal: to go home  PT Goal Formulation: With patient Time For Goal Achievement: 12/02/16 Potential to Achieve Goals: Good    Frequency 7X/week   Barriers to discharge        Co-evaluation               AM-PAC PT "6 Clicks" Daily Activity  Outcome Measure Difficulty turning over in bed  (including adjusting bedclothes, sheets and blankets)?: A Little Difficulty moving from lying on back to sitting on the side of the bed? : Total Difficulty sitting down on and standing up from a chair with arms (e.g., wheelchair, bedside commode, etc,.)?: Total Help needed moving to and from a bed to chair (including a wheelchair)?: A Little Help needed walking in hospital room?: A Little Help needed climbing 3-5 steps with a railing? : A Lot 6 Click Score: 13    End of Session Equipment Utilized During Treatment: Gait belt Activity Tolerance: Patient tolerated treatment well Patient left: in chair;with call bell/phone within reach;with family/visitor present Nurse Communication: Mobility status PT Visit Diagnosis: Other abnormalities of gait and mobility (R26.89);Pain Pain - Right/Left: Left Pain - part of body: Knee    Time: 1737-1810 PT Time Calculation (min) (ACUTE ONLY): 33 min   Charges:   PT Evaluation $PT Eval Low Complexity: 1 Low PT Treatments $Therapeutic Exercise: 8-22 mins   PT G Codes:        Gladys DammeBrittany Shamarr Faucett, PT, DPT  Acute Rehabilitation Services  Pager: [phone]   Lehman PromBrittany S Orvis Stann 11/25/2016, 6:17 PM

## 2016-11-25 NOTE — Progress Notes (Signed)
Orthopedic Tech Progress Note Patient Details:  Joseph Kidd 03/16/1951 [id number]  CPM Left Knee CPM Left Knee: On Left Knee Flexion (Degrees): 90 Left Knee Extension (Degrees): 0 Additional Comments: applied at 1330   [name] 11/25/2016, 1:33 PM ohf not applied because pt's weight exceeds weight durability of frame; RN notified

## 2016-11-26 ENCOUNTER — Telehealth (INDEPENDENT_AMBULATORY_CARE_PROVIDER_SITE_OTHER): Payer: Self-pay | Admitting: Orthopedic Surgery

## 2016-11-26 ENCOUNTER — Encounter (HOSPITAL_COMMUNITY): Payer: Self-pay | Admitting: Orthopaedic Surgery

## 2016-11-26 LAB — BASIC METABOLIC PANEL
Anion gap: 6 (ref 5–15)
BUN: 19 mg/dL (ref 6–20)
CALCIUM: 8.9 mg/dL (ref 8.9–10.3)
CO2: 24 mmol/L (ref 22–32)
CREATININE: 0.94 mg/dL (ref 0.61–1.24)
Chloride: 104 mmol/L (ref 101–111)
GFR calc non Af Amer: >60 mL/min (ref >60)
Glucose, Bld: 130 mg/dL — ABNORMAL HIGH (ref 65–99)
Potassium: 3.9 mmol/L (ref 3.5–5.1)
Sodium: 134 mmol/L — ABNORMAL LOW (ref 135–145)

## 2016-11-26 LAB — CBC
HCT: 37.1 % — ABNORMAL LOW (ref 39.0–52.0)
Hemoglobin: 12.1 g/dL — ABNORMAL LOW (ref 13.0–17.0)
MCH: 27.1 pg (ref 26.0–34.0)
MCHC: 32.6 g/dL (ref 30.0–36.0)
MCV: 83 fL (ref 78.0–100.0)
Platelets: 204 10*3/uL (ref 150–400)
RBC: 4.47 MIL/uL (ref 4.22–5.81)
RDW: 16.5 % — AB (ref 11.5–15.5)
WBC: 11.1 10*3/uL — ABNORMAL HIGH (ref 4.0–10.5)

## 2016-11-26 NOTE — Evaluation (Signed)
Occupational Therapy Evaluation Patient Details Name: Joseph Kidd MRN: [id number] DOB: 05/30/1950 Today's Date: 11/26/2016    History of Present Illness Pt is a 66 y/o male s/p L TKA. PMH includes HTN, DVT, NSTEMI s/p CABG X 5, and CAD.    Clinical Impression   Patient evaluated by Occupational Therapy with no further acute OT needs identified. All education has been completed and the patient has no further questions. See below for any follow-up Occupational Therapy or equipment needs. OT to sign off. Thank you for referral.      Follow Up Recommendations  No OT follow up    Equipment Recommendations  None recommended by OT    Recommendations for Other Services       Precautions / Restrictions Precautions Precautions: Knee;Other (comment) Restrictions Weight Bearing Restrictions: Yes LLE Weight Bearing: Weight bearing as tolerated LLE Partial Weight Bearing Percentage or Pounds: 50      Mobility Bed Mobility               General bed mobility comments: in chair on arrival  Transfers Overall transfer level: Needs assistance Equipment used: Rolling walker (2 wheeled) Transfers: Sit to/from Stand Sit to Stand: Modified independent (Device/Increase time)         General transfer comment: chair moving despite being locked and pt still able to power up with bil UE used    Balance           Standing balance support: No upper extremity supported;During functional activity Standing balance-Leahy Scale: Normal                             ADL either performed or assessed with clinical judgement   ADL Overall ADL's : Modified independent                                       General ADL Comments: pt doning shorts on arrival and completed full adl at sink. Simulated walkin shower transfer. pt without questions or concerns  Pt educated on bathing and avoid washing directly on incision. Pt educated to use new wash cloth and towel  each day. Pt educated to allow water to run across dressing and not to soak in a tub at this time.      Vision Baseline Vision/History: Wears glasses Wears Glasses: At all times       Perception     Praxis      Pertinent Vitals/Pain Pain Assessment: Faces Faces Pain Scale: Hurts a little bit Pain Location: L knee  Pain Descriptors / Indicators: Aching;Operative site guarding;Sore Pain Intervention(s): Monitored during session;Premedicated before session     Hand Dominance Left   Extremity/Trunk Assessment Upper Extremity Assessment Upper Extremity Assessment: Overall WFL for tasks assessed   Lower Extremity Assessment Lower Extremity Assessment: Defer to PT evaluation   Cervical / Trunk Assessment Cervical / Trunk Assessment: Normal   Communication Communication Communication: No difficulties   Cognition Arousal/Alertness: Awake/alert Behavior During Therapy: WFL for tasks assessed/performed Overall Cognitive Status: Within Functional Limits for tasks assessed                                     General Comments  post op dressing still in place and dry    Exercises  Shoulder Instructions      Home Living Family/patient expects to be discharged to:: Private residence Living Arrangements: Spouse/significant other Available Help at Discharge: Family;Available 24 hours/day Type of Home: House Home Access: Level entry     Home Layout: Two level;Laundry or work area in [employer]basement Alternate Level Stairs-Number of Steps: 13 Alternate Level Stairs-Rails: Right;Left;Can reach both Foot LockerBathroom Shower/Tub: Producer, television/film/videoWalk-in shower   Bathroom Toilet: Handicapped height     Home Equipment: Environmental consultantWalker - 2 wheels;Other (comment);Tub bench;Grab bars - tub/shower (CPM )   Additional Comments: reports "i have plenty of help at home"      Prior Functioning/Environment Level of Independence: Independent                 OT Problem List:        OT  Treatment/Interventions:      OT Goals(Current goals can be found in the care plan section) Acute Rehab OT Goals Patient Stated Goal: to go home   OT Frequency:     Barriers to D/C:            Co-evaluation              AM-PAC PT "6 Clicks" Daily Activity     Outcome Measure Help from another person eating meals?: None Help from another person taking care of personal grooming?: None Help from another person toileting, which includes using toliet, bedpan, or urinal?: None Help from another person bathing (including washing, rinsing, drying)?: None Help from another person to put on and taking off regular upper body clothing?: None Help from another person to put on and taking off regular lower body clothing?: A Little 6 Click Score: 23   End of Session Equipment Utilized During Treatment: Gait belt;Rolling walker CPM Left Knee CPM Left Knee: Off Nurse Communication: Mobility status;Precautions;Weight bearing status  Activity Tolerance: Patient tolerated treatment well Patient left: Other (comment) (in hall with PT Vicky)  OT Visit Diagnosis: Unsteadiness on feet (R26.81)                Time: [id number] OT Time Calculation (min): 15 min Charges:  OT General Charges $OT Visit: 1 Procedure OT Evaluation $OT Eval Moderate Complexity: 1 Procedure G-Codes:      Joseph Kidd, Joseph Kidd   OTR/L Pager: [phone] Office: [phone] .   Joseph Kidd, Joseph Kidd B 11/26/2016, 9:23 AM

## 2016-11-26 NOTE — Progress Notes (Signed)
Physical Therapy Treatment Patient Details Name: Joseph Kidd M Wagar MRN: [id number] DOB: 09/17/1950 Today's Date: 11/26/2016    History of Present Illness Pt is a 66 y/o male s/p L TKA. PMH includes HTN, DVT, NSTEMI s/p CABG X 5, and CAD.     PT Comments    Pt progressing well, ambulated 100' with RW and supervision, ascended 5 stairs with rail and min-guard A, and is mod I with bed mobility and transfers. He is desiring to go home today. From a PT standpoint, he would be safe to do this after session in PM if agreed upon by physician. PT will continue to follow.    Follow Up Recommendations  DC plan and follow up therapy as arranged by surgeon;Supervision/Assistance - 24 hour     Equipment Recommendations  None recommended by PT (has all DME )    Recommendations for Other Services       Precautions / Restrictions Precautions Precautions: Knee Restrictions Weight Bearing Restrictions: Yes LLE Weight Bearing: Weight bearing as tolerated LLE Partial Weight Bearing Percentage or Pounds: 50    Mobility  Bed Mobility Overal bed mobility: Modified Independent Bed Mobility: Sit to Supine       Sit to supine: Supervision   General bed mobility comments: pt able to get self into bed and position self without assist  Transfers Overall transfer level: Modified independent Equipment used: Rolling walker (2 wheeled) Transfers: Sit to/from Stand Sit to Stand: Modified independent (Device/Increase time)         General transfer comment: safe stand and sit from bed and chair  Ambulation/Gait Ambulation/Gait assistance: Supervision Ambulation Distance (Feet): 100 Feet Assistive device: Rolling walker (2 wheeled) Gait Pattern/deviations: Step-to pattern;Decreased step length - right;Decreased step length - left;Decreased weight shift to left;Antalgic Gait velocity: Decreased Gait velocity interpretation: Below normal speed for age/gender General Gait Details: pt with minimal L  knee bend on swing through, working towards gaining more. R knee obvious genu varus with need for TKA that side per pt.    Stairs Stairs: Yes   Stair Management: One rail Right;Step to pattern;Forwards Number of Stairs: 5 General stair comments: pt has difficulty with descent due to R knee dysfunction but was able to complete with heavy use of rail. At home he will be able to reach both rails which will help  Wheelchair Mobility    Modified Rankin (Stroke Patients Only)       Balance Overall balance assessment: Modified Independent Sitting-balance support: No upper extremity supported;Feet supported Sitting balance-Leahy Scale: Good     Standing balance support: No upper extremity supported;During functional activity Standing balance-Leahy Scale: Good Standing balance comment: safe in static standing without support but antalgic gait noted without RW, needs support for safety                            Cognition Arousal/Alertness: Awake/alert Behavior During Therapy: WFL for tasks assessed/performed Overall Cognitive Status: Within Functional Limits for tasks assessed                                        Exercises Total Joint Exercises Ankle Circles/Pumps: AROM;Both;15 reps;Seated Quad Sets: AROM;Left;Supine;15 reps Gluteal Sets: AROM;Both;15 reps;Supine Heel Slides: AROM;10 reps;Supine;Left Hip ABduction/ADduction: AROM;Left;15 reps;Supine Straight Leg Raises: AROM;Left;10 reps;Supine Long Arc Quad: AROM;Left;10 reps;Seated Knee Flexion: AROM;Left;10 reps;Seated Goniometric ROM: 10-60    General  Comments General comments (skin integrity, edema, etc.): post op drain in place      Pertinent Vitals/Pain Pain Assessment: Faces Faces Pain Scale: Hurts a little bit Pain Location: L knee  Pain Descriptors / Indicators: Aching;Operative site guarding;Sore Pain Intervention(s): Limited activity within patient's tolerance;Monitored during  session    Home Living Family/patient expects to be discharged to:: Private residence Living Arrangements: Spouse/significant other Available Help at Discharge: Family;Available 24 hours/day Type of Home: House Home Access: Level entry   Home Layout: Two level;Laundry or work area in [employer]ement Home Equipment: Environmental consultantWalker - 2 wheels;Other (comment);Tub bench;Grab bars - tub/shower (CPM ) Additional Comments: reports "i have plenty of help at home"    Prior Function Level of Independence: Independent          PT Goals (current goals can now be found in the care plan section) Acute Rehab PT Goals Patient Stated Goal: to go home  PT Goal Formulation: With patient Time For Goal Achievement: 12/02/16 Potential to Achieve Goals: Good Progress towards PT goals: Progressing toward goals    Frequency    7X/week      PT Plan Current plan remains appropriate    Co-evaluation              AM-PAC PT "6 Clicks" Daily Activity  Outcome Measure  Difficulty turning over in bed (including adjusting bedclothes, sheets and blankets)?: None Difficulty moving from lying on back to sitting on the side of the bed? : None Difficulty sitting down on and standing up from a chair with arms (e.g., wheelchair, bedside commode, etc,.)?: None Help needed moving to and from a bed to chair (including a wheelchair)?: A Little Help needed walking in hospital room?: A Little Help needed climbing 3-5 steps with a railing? : A Little 6 Click Score: 21    End of Session   Activity Tolerance: Patient tolerated treatment well Patient left: with call bell/phone within reach;in bed Nurse Communication: Mobility status PT Visit Diagnosis: Other abnormalities of gait and mobility (R26.89);Pain Pain - Right/Left: Left Pain - part of body: Knee     Time: 0912-0940 PT Time Calculation (min) (ACUTE ONLY): 28 min  Charges:  $Gait Training: 8-22 mins $Therapeutic Exercise: 8-22 mins                    G  Codes:       Lyanne CoVictoria Hadas Jessop, PT  Acute Rehab Services  [phone]    Lawana ChambersVictoria L Rochel Privett 11/26/2016, 9:59 AM

## 2016-11-26 NOTE — Progress Notes (Signed)
Orthopedic Tech Progress Note Patient Details:  Joseph Kidd 05/20/1950 [id number]  Ortho Devices Ortho Device/Splint Location: on cpm at 1840   [name], [name] 11/26/2016, 6:41 PM

## 2016-11-26 NOTE — Progress Notes (Signed)
PATIENT ID: Joseph Eastlan M Zimny        MRN:  [id number]          DOB/AGE: 67/11/1950 / 66 y.o.    Norlene CampbellPeter Torry Istre, MD   Jacqualine CodeBrian Petrarca, PA-C [address] [place], [place]  [address]                             [phone]   PROGRESS NOTE  Subjective:  negative for Chest Pain  negative for Shortness of Breath  negative for Nausea/Vomiting   negative for Calf Pain    Tolerating Diet: yes         Patient reports pain as mild.     Comfortable night-no related problems  Objective: Vital signs in last 24 hours:   Patient Vitals for the past 24 hrs:  BP Temp Temp src Pulse Resp SpO2 Weight  11/26/16 0419 (!) 162/98 97.9 F (36.6 C) Oral 73 20 95 % -  11/26/16 0000 134/84 98.2 F (36.8 C) Oral 79 18 96 % -  11/25/16 2014 (!) 144/83 98.1 F (36.7 C) Oral 78 18 96 % -  11/25/16 1430 (!) 153/92 97.7 F (36.5 C) Oral 60 18 98 % -  11/25/16 1353 (!) 135/92 (!) 97.4 F (36.3 C) - 60 17 95 % -  11/25/16 1340 (!) 133/94 - - 60 17 97 % -  11/25/16 1325 126/85 - - 64 16 96 % -  11/25/16 1310 112/71 - - 60 13 98 % -  11/25/16 1255 116/75 - - 62 17 96 % -  11/25/16 1241 111/83 (!) 97.4 F (36.3 C) - 66 19 98 % -  11/25/16 0920 - - - 65 18 95 % -  11/25/16 0917 - - - 66 18 95 % -  11/25/16 0915 (!) 150/100 - - 66 18 96 % -  11/25/16 0910 (!) 143/98 - - 68 11 98 % -  11/25/16 0906 - - - 68 14 97 % -  11/25/16 0905 (!) 150/93 - - 68 18 97 % -  11/25/16 0900 (!) 146/92 - - 68 19 97 % -  11/25/16 0819 (!) 160/103 - - - - - -  11/25/16 0812 (!) 164/100 98 F (36.7 C) Oral 71 16 97 % 232 lb (105.2 kg)      Intake/Output from previous day:   07/31 0701 - 08/01 0700 In: 1640 [P.O.:340; I.V.:1100] Out: 1950 [Urine:1800; Drains:150]   Intake/Output this shift:   No intake/output data recorded.   Intake/Output      07/31 0701 - 08/01 0700 08/01 0701 - 08/02 0700   P.O. 340    I.V. (mL/kg) 1100 (10.5)    IV Piggyback 200    Total Intake(mL/kg) 1640 (15.6)    Urine (mL/kg/hr) 1800    Drains 150    Blood 0    Total Output 1950     Net -310             LABORATORY DATA:  Recent Labs  11/26/16 0430  WBC 11.1*  HGB 12.1*  HCT 37.1*  PLT 204    Recent Labs  11/26/16 0430  NA 134*  K 3.9  CL 104  CO2 24  BUN 19  CREATININE 0.94  GLUCOSE 130*  CALCIUM 8.9   Lab Results  Component Value Date   INR 1.06 11/12/2016   INR 1.77 07/04/2016   INR 1.09 07/04/2016    Recent Radiographic Studies :  No results found.   Examination:  General appearance: alert, cooperative and no distress  Wound Exam: clean, dry, intact   Drainage:  Moderate amount Serosanguinous exudate  Motor Exam: EHL, FHL, Anterior Tibial and Posterior Tibial Intact  Sensory Exam: Superficial Peroneal, Deep Peroneal and Tibial normal  Vascular Exam: Normal  Assessment:    1 Day Post-Op  Procedure(s) (LRB): LEFT TOTAL KNEE ARTHROPLASTY (Left)  ADDITIONAL DIAGNOSIS:  Principal Problem:   Primary osteoarthritis of left knee Active Problems:   Osteoarthritis of knee   S/P total knee replacement using cement, left     Plan: Physical Therapy as ordered Partial Weight Bearing @ 50% (PWB)  DVT Prophylaxis:  Xarelto,TED hose, foiot pumps  DISCHARGE PLAN: Home  DISCHARGE NEEDS: HHPT, CPM, Walker and 3-in-1 comode seat OOB with PT,saline lock IV,hope for D/C tomorrow       Valeria Batmaneter W Jaleigha Deane  11/26/2016 7:36 AM  Patient ID: Joseph Kidd, male   DOB: 12/28/1950, 66 y.o.   MRN: [id number]

## 2016-11-26 NOTE — Progress Notes (Signed)
Physical Therapy Treatment Patient Details Name: Joseph Kidd MRN: [id number] DOB: 08/07/1950 Today's Date: 11/26/2016    History of Present Illness Pt is a 66 y/o male s/p L TKA. PMH includes HTN, DVT, NSTEMI s/p CABG X 5, and CAD.     PT Comments    Pt ambulated 250' with RW, mod I. Pattern remains asymmetrical and very antalgic, in part due to R knee dysfunction. Continued to work on L knee flexion with swing through phase. PT will continue to follow.   Follow Up Recommendations  DC plan and follow up therapy as arranged by surgeon;Supervision/Assistance - 24 hour     Equipment Recommendations  None recommended by PT (has all DME )    Recommendations for Other Services       Precautions / Restrictions Precautions Precautions: Knee Precaution Comments: reviewed proper positioning Restrictions Weight Bearing Restrictions: Yes LLE Weight Bearing: Weight bearing as tolerated LLE Partial Weight Bearing Percentage or Pounds: 50    Mobility  Bed Mobility Overal bed mobility: Modified Independent Bed Mobility: Sit to Supine       Sit to supine: Modified independent (Device/Increase time)   General bed mobility comments: pt returned to bed and was able to get positioned into CPM  Transfers Overall transfer level: Modified independent Equipment used: Rolling walker (2 wheeled) Transfers: Sit to/from Stand Sit to Stand: Modified independent (Device/Increase time)         General transfer comment: safe stand and sit from bed and chair  Ambulation/Gait Ambulation/Gait assistance: Modified independent (Device/Increase time) Ambulation Distance (Feet): 250 Feet Assistive device: Rolling walker (2 wheeled) Gait Pattern/deviations: Decreased step length - right;Decreased step length - left;Decreased weight shift to left;Antalgic;Step-through pattern Gait velocity: Decreased Gait velocity interpretation: Below normal speed for age/gender General Gait Details: antalgic  gait, vc's for shorter step to try to even out pattern but pt limited by R knee dysfunction as well   Stairs            Wheelchair Mobility    Modified Rankin (Stroke Patients Only)       Balance Overall balance assessment: Modified Independent Sitting-balance support: No upper extremity supported;Feet supported Sitting balance-Leahy Scale: Good     Standing balance support: No upper extremity supported;During functional activity Standing balance-Leahy Scale: Good Standing balance comment: safe in static standing without support but antalgic gait noted without RW, needs support for safety                            Cognition Arousal/Alertness: Awake/alert Behavior During Therapy: WFL for tasks assessed/performed Overall Cognitive Status: Within Functional Limits for tasks assessed                                        Exercises Total Joint Exercises Ankle Circles/Pumps: AROM;Both;15 reps;Seated Heel Slides: AROM;10 reps;Supine;Left Long Arc Quad: AROM;Left;10 reps;Seated Knee Flexion: AROM;Left;10 reps;Seated Goniometric ROM: 10-80 Marching in Standing: AROM;10 reps;Standing    General Comments        Pertinent Vitals/Pain Pain Assessment: 0-10 Pain Score: 2  Pain Location: L knee  Pain Descriptors / Indicators: Aching;Operative site guarding;Sore Pain Intervention(s): Limited activity within patient's tolerance;Monitored during session    Home Living                      Prior Function  PT Goals (current goals can now be found in the care plan section) Acute Rehab PT Goals Patient Stated Goal: to go home  PT Goal Formulation: With patient Time For Goal Achievement: 12/02/16 Potential to Achieve Goals: Good Progress towards PT goals: Progressing toward goals    Frequency    7X/week      PT Plan Current plan remains appropriate    Co-evaluation              AM-PAC PT "6 Clicks" Daily  Activity  Outcome Measure  Difficulty turning over in bed (including adjusting bedclothes, sheets and blankets)?: None Difficulty moving from lying on back to sitting on the side of the bed? : None Difficulty sitting down on and standing up from a chair with arms (e.g., wheelchair, bedside commode, etc,.)?: None Help needed moving to and from a bed to chair (including a wheelchair)?: None Help needed walking in hospital room?: None Help needed climbing 3-5 steps with a railing? : A Little 6 Click Score: 23    End of Session Equipment Utilized During Treatment: Gait belt Activity Tolerance: Patient tolerated treatment well Patient left: with call bell/phone within reach;in bed;in CPM (0-90) Nurse Communication: Mobility status PT Visit Diagnosis: Other abnormalities of gait and mobility (R26.89);Pain Pain - Right/Left: Left Pain - part of body: Knee     Time: 1345-1407 PT Time Calculation (min) (ACUTE ONLY): 22 min  Charges:  $Gait Training: 8-22 mins                    G Codes:       Joseph Kidd, PT  Acute Rehab Services  [phone]    Joseph Kidd 11/26/2016, 3:12 PM

## 2016-11-26 NOTE — Telephone Encounter (Signed)
Patient calling to let you know he is ready to be DCed from the hospital. Physical Therapist released him. Patient is ready to go.

## 2016-11-27 LAB — BASIC METABOLIC PANEL
ANION GAP: 6 (ref 5–15)
BUN: 17 mg/dL (ref 6–20)
CALCIUM: 8.5 mg/dL — AB (ref 8.9–10.3)
CO2: 26 mmol/L (ref 22–32)
Chloride: 107 mmol/L (ref 101–111)
Creatinine, Ser: 0.98 mg/dL (ref 0.61–1.24)
GFR calc Af Amer: >60 mL/min (ref >60)
GLUCOSE: 95 mg/dL (ref 65–99)
Potassium: 4.3 mmol/L (ref 3.5–5.1)
SODIUM: 139 mmol/L (ref 135–145)

## 2016-11-27 LAB — CBC
HCT: 34.1 % — ABNORMAL LOW (ref 39.0–52.0)
Hemoglobin: 11 g/dL — ABNORMAL LOW (ref 13.0–17.0)
MCH: 27.1 pg (ref 26.0–34.0)
MCHC: 32.3 g/dL (ref 30.0–36.0)
MCV: 84 fL (ref 78.0–100.0)
PLATELETS: 196 10*3/uL (ref 150–400)
RBC: 4.06 MIL/uL — ABNORMAL LOW (ref 4.22–5.81)
RDW: 16.9 % — AB (ref 11.5–15.5)
WBC: 8.2 10*3/uL (ref 4.0–10.5)

## 2016-11-27 MED ORDER — METHOCARBAMOL 500 MG PO TABS: 500.0000 mg | ORAL_TABLET | Freq: Four times a day (QID) | ORAL | 0 refills | Status: DC | PRN

## 2016-11-27 MED ORDER — OXYCODONE HCL 5 MG PO TABS: 5.0000 mg | ORAL_TABLET | ORAL | 0 refills | Status: DC | PRN

## 2016-11-27 MED ORDER — RIVAROXABAN 10 MG PO TABS: 10.0000 mg | ORAL_TABLET | Freq: Every day | ORAL | 0 refills | Status: DC

## 2016-11-27 NOTE — Progress Notes (Signed)
Physical Therapy Treatment Patient Details Name: Joseph Kidd MRN: [id number] DOB: 08/03/1950 Today's Date: 11/27/2016    History of Present Illness Pt is a 66 y/o male s/p L TKA. PMH includes HTN, DVT, NSTEMI s/p CABG X 5, and CAD.     PT Comments    Pt presents with a slight increase in lethargy and stiffness this session as compared to previous session. Pt is, however, able to perform LE strengthening with ROM measurements noted and gait with slow antalgic gait. Pt is expected to progress well once he returns home.     Follow Up Recommendations  DC plan and follow up therapy as arranged by surgeon;Supervision/Assistance - 24 hour     Equipment Recommendations  None recommended by PT    Recommendations for Other Services       Precautions / Restrictions Precautions Precautions: Knee Precaution Booklet Issued: Yes (comment) Precaution Comments: reviewed proper positioning Restrictions Weight Bearing Restrictions: Yes LLE Weight Bearing: Partial weight bearing LLE Partial Weight Bearing Percentage or Pounds: 50    Mobility  Bed Mobility Overal bed mobility: Modified Independent Bed Mobility: Supine to Sit     Supine to sit: Modified independent (Device/Increase time)     General bed mobility comments: pt able to perform supine to sit without railing, HOB flat and cues for using RLE to assist LLE EOB. pt went out the right side of the bed like he will do at home. No therapist assistance, just increased time to complete.   Transfers Overall transfer level: Modified independent Equipment used: Rolling walker (2 wheeled) Transfers: Sit to/from Stand Sit to Stand: Modified independent (Device/Increase time)         General transfer comment: Mod I noted this session from EOB  Ambulation/Gait Ambulation/Gait assistance: Modified independent (Device/Increase time) Ambulation Distance (Feet): 200 Feet Assistive device: Rolling walker (2 wheeled) Gait  Pattern/deviations: Decreased step length - right;Decreased step length - left;Decreased weight shift to left;Antalgic;Step-through pattern Gait velocity: Decreased Gait velocity interpretation: Below normal speed for age/gender General Gait Details: increased antalgic gait this session, cues for proximity to RW. Increased discomfort and sitffness noted this session as compared to previous session.    Stairs            Wheelchair Mobility    Modified Rankin (Stroke Patients Only)       Balance Overall balance assessment: Modified Independent Sitting-balance support: No upper extremity supported;Feet supported Sitting balance-Leahy Scale: Good     Standing balance support: No upper extremity supported;During functional activity Standing balance-Leahy Scale: Good Standing balance comment: safe in static standing without support but antalgic gait noted without RW, needs support for safety                            Cognition Arousal/Alertness: Awake/alert Behavior During Therapy: WFL for tasks assessed/performed Overall Cognitive Status: Within Functional Limits for tasks assessed                                        Exercises Total Joint Exercises Long Arc Quad: AROM;Left;10 reps;Seated Knee Flexion: AROM;Left;10 reps;Seated Goniometric ROM: 2-80    General Comments        Pertinent Vitals/Pain Pain Assessment: 0-10 Pain Score: 5  Pain Location: L knee  Pain Descriptors / Indicators: Aching;Operative site guarding;Sore Pain Intervention(s): Monitored during session;Premedicated before session;Repositioned;Ice applied  Home Living                      Prior Function            PT Goals (current goals can now be found in the care plan section) Acute Rehab PT Goals Patient Stated Goal: to go home  Progress towards PT goals: Progressing toward goals    Frequency    7X/week      PT Plan Current plan remains  appropriate    Co-evaluation              AM-PAC PT "6 Clicks" Daily Activity  Outcome Measure  Difficulty turning over in bed (including adjusting bedclothes, sheets and blankets)?: None Difficulty moving from lying on back to sitting on the side of the bed? : None Difficulty sitting down on and standing up from a chair with arms (e.g., wheelchair, bedside commode, etc,.)?: None Help needed moving to and from a bed to chair (including a wheelchair)?: None Help needed walking in hospital room?: None Help needed climbing 3-5 steps with a railing? : A Little 6 Click Score: 23    End of Session Equipment Utilized During Treatment: Gait belt Activity Tolerance: Patient tolerated treatment well Patient left: in chair;with call bell/phone within reach Nurse Communication: Mobility status PT Visit Diagnosis: Other abnormalities of gait and mobility (R26.89);Pain Pain - Right/Left: Left Pain - part of body: Knee     Time: [id number] PT Time Calculation (min) (ACUTE ONLY): 28 min  Charges:  $Gait Training: 8-22 mins $Therapeutic Exercise: 8-22 mins                    G Codes:       Joseph Kidd PT, DPT  [phone]    Joseph Kidd 11/27/2016, 10:35 AM

## 2016-11-27 NOTE — Progress Notes (Signed)
PATIENT ID: Joseph Kidd M Tunison        MRN:  [id number]          DOB/AGE: 67/11/1950 / 66 y.o.    Joseph CampbellPeter Jadamarie Butson, MD   Joseph CodeBrian Petrarca, PA-C [address] [place], [place]  [address]                             [phone]   PROGRESS NOTE  Subjective:  negative for Chest Pain  negative for Shortness of Breath  negative for Nausea/Vomiting   negative for Calf Pain    Tolerating Diet: yes         Patient reports pain as moderate.     "I'm a little loopy with the pain meds"-otherwise anxious to go home  Objective: Vital signs in last 24 hours:   Patient Vitals for the past 24 hrs:  BP Temp Temp src Pulse Resp SpO2  11/27/16 0457 (!) 162/95 98 F (36.7 C) Oral 73 17 95 %  11/26/16 2023 (!) 161/97 98.4 F (36.9 C) Oral 76 17 96 %  11/26/16 1459 (!) 152/98 (!) 97 F (36.1 C) Oral 76 18 98 %      Intake/Output from previous day:   08/01 0701 - 08/02 0700 In: 700 [P.O.:700] Out: 1460 [Urine:1025; Drains:435]   Intake/Output this shift:   08/02 0701 - 08/02 1900 In: -  Out: 400 [Urine:400]   Intake/Output      08/01 0701 - 08/02 0700 08/02 0701 - 08/03 0700   P.O. 700    Total Intake(mL/kg) 700 (6.7)    Urine (mL/kg/hr) 1025 (0.4) 400 (4.2)   Drains 435    Total Output 1460 400   Net -760 -400        Urine Occurrence 2 x       LABORATORY DATA:  Recent Labs  11/26/16 0430 11/27/16 0357  WBC 11.1* 8.2  HGB 12.1* 11.0*  HCT 37.1* 34.1*  PLT 204 196    Recent Labs  11/26/16 0430 11/27/16 0357  NA 134* 139  K 3.9 4.3  CL 104 107  CO2 24 26  BUN 19 17  CREATININE 0.94 0.98  GLUCOSE 130* 95  CALCIUM 8.9 8.5*   Lab Results  Component Value Date   INR 1.06 11/12/2016   INR 1.77 07/04/2016   INR 1.09 07/04/2016    Recent Radiographic Studies :  No results found.   Examination:  General appearance: alert, cooperative and no distress  Wound Exam: clean, dry, intact   Drainage:  Scant/small amount Serosanguinous exudate In hemovac Motor Exam:  EHL, FHL, Anterior Tibial and Posterior Tibial Intact  Sensory Exam: Superficial Peroneal, Deep Peroneal and Tibial normal  Vascular Exam: Normal  Assessment:    2 Days Post-Op  Procedure(s) (LRB): LEFT TOTAL KNEE ARTHROPLASTY (Left)  ADDITIONAL DIAGNOSIS:  Principal Problem:   Primary osteoarthritis of left knee Active Problems:   Osteoarthritis of knee   S/P total knee replacement using cement, left     Plan: Physical Therapy as ordered  DVT Prophylaxis:  Partial Weight Bearing @ 50% (PWB)  DISCHARGE PLAN: Home  DISCHARGE NEEDS: HHPT, CPM, Walker and 3-in-1 comode seat Awake, alert this am, voiding without difficulty, good effort in PT,no calf pain or SOB. Dressing changed left knee-wound clean and dry. BP slightly elevated-will have him check with his FP. OK for discharge.       [name]  11/27/2016 7:55 AM  Patient  ID: Joseph Kidd, male   DOB: 02/26/1951, 66 y.o.   MRN: [id number]

## 2016-11-27 NOTE — Discharge Summary (Signed)
Norlene CampbellPeter Kazi Reppond, MD   Jacqualine CodeBrian Petrarca, PA-C [address], [place], [place]  [id number]                             [phone]  PATIENT ID: Joseph Kidd M Arcos        MRN:  [id number]          DOB/AGE: 67/11/1950 / 67 y.o.    DISCHARGE SUMMARY  ADMISSION DATE:    11/25/2016 DISCHARGE DATE:   11/27/2016   ADMISSION DIAGNOSIS: LEFT KNEE OSTEOARTHRITIS    DISCHARGE DIAGNOSIS:  LEFT KNEE OSTEOARTHRITIS    ADDITIONAL DIAGNOSIS: Principal Problem:   Primary osteoarthritis of left knee Active Problems:   Osteoarthritis of knee   S/P total knee replacement using cement, left hypertension Past Medical History:  Diagnosis Date  . Coronary artery disease   . DVT (deep venous thrombosis) (HCC)    right lower leg  . Hypercholesterolemia   . Hypertension   . Myocardial infarction (HCC)   . Osteoarthritis     PROCEDURE: Procedure(s): LEFT TOTAL KNEE ARTHROPLASTY on 11/25/2016   HISTORY:  See H&P in chartMr. Wagman has been seen on numerous occasions in the past for end-stage osteoarthritis of both knees. He is completed a course of Visco supplementation with only minimal proximally a year ago. He is reached a point where he really is having miserable time with sleeping walking and performing activities of daily living and wishes to discuss knee replacement. Many years ago he had a crush injury to his right leg when he was kicked by a horse. Not really had any problems since that time. He just recently was diagnosed with DVT in his right lower extremity and treated by Dr. SwazilandJordan with the above. He completed the xareltoin early July.  Patient is a 66 y.o. male presented with a history of pain in the left knee for 6 years. Onset of symptoms was gradual starting 6 years ago with rapidly worsening course since that time. Prior procedures on the knee are none. Patient has been treated conservatively with over-the-counter NSAIDs and activity modification. Patient currently rates pain in the knee  at 7 out of 10 with activity. There is pain at night. present.  HOSPITAL COURSE:  Joseph Kidd M Coleson is a 66 y.o. admitted on 11/25/2016 and found to have a diagnosis of LEFT KNEE OSTEOARTHRITIS.  After appropriate laboratory studies were obtained  Patient was taken to the operating room on 11/25/2016 and underwent  Procedure(s): LEFT TOTAL KNEE ARTHROPLASTY    They were given perioperative antibiotics:  Anti-infectives    Start     Dose/Rate Route Frequency Ordered Stop   11/25/16 1630  ceFAZolin (ANCEF) IVPB 2g/100 mL premix     2 g 200 mL/hr over 30 Minutes Intravenous Every 6 hours 11/25/16 1445 11/26/16 0020   11/25/16 0757  ceFAZolin (ANCEF) IVPB 2g/100 mL premix     2 g 200 mL/hr over 30 Minutes Intravenous On call to O.R. 11/25/16 [id number] 11/25/16 1035    .  Tolerated the procedure well.  Placed with a foley intraoperatively.    Toradol was given post op.  POD #1, allowed out of bed to a chair.  PT for ambulation and exercise program.  Foley D/C'd in morning.  IV saline locked.  O2 discontionued.  POD #2, continued PT and ambulation.  Hemovac pulled.with minimal drainage. Dressing changed .  The remainder of the hospital course was dedicated to ambulation and strengthening.  The patient was discharged on 2 Days Post-Op in  stable condition.  Blood products given:none  DIAGNOSTIC STUDIES: Recent vital signs: Patient Vitals for the past 24 hrs:  BP Temp Temp src Pulse Resp SpO2  11/27/16 0457 (!) 162/95 98 F (36.7 C) Oral 73 17 95 %  11/26/16 2023 (!) 161/97 98.4 F (36.9 C) Oral 76 17 96 %  11/26/16 1459 (!) 152/98 (!) 97 F (36.1 C) Oral 76 18 98 %       Recent laboratory studies:  Recent Labs  11/26/16 0430 11/27/16 0357  WBC 11.1* 8.2  HGB 12.1* 11.0*  HCT 37.1* 34.1*  PLT 204 196    Recent Labs  11/26/16 0430 11/27/16 0357  NA 134* 139  K 3.9 4.3  CL 104 107  CO2 24 26  BUN 19 17  CREATININE 0.94 0.98  GLUCOSE 130* 95  CALCIUM 8.9 8.5*   Lab Results   Component Value Date   INR 1.06 11/12/2016   INR 1.77 07/04/2016   INR 1.09 07/04/2016     Recent Radiographic Studies :  No results found.  DISCHARGE INSTRUCTIONS:   DISCHARGE MEDICATIONS:   Allergies as of 11/27/2016      Reactions   Atorvastatin    Severe myalgias   Chlorhexidine Rash      Medication List    STOP taking these medications   aspirin EC 81 MG tablet     TAKE these medications   diltiazem 120 MG 24 hr capsule Commonly known as:  CARDIZEM CD Take 1 capsule (120 mg total) by mouth daily.   methocarbamol 500 MG tablet Commonly known as:  ROBAXIN Take 1 tablet (500 mg total) by mouth every 6 (six) hours as needed for muscle spasms.   oxyCODONE 5 MG immediate release tablet Commonly known as:  Oxy IR/ROXICODONE Take 1-2 tablets (5-10 mg total) by mouth every 4 (four) hours as needed for moderate pain, severe pain or breakthrough pain.   rivaroxaban 10 MG Tabs tablet Commonly known as:  XARELTO Take 1 tablet (10 mg total) by mouth daily with breakfast.   rosuvastatin 10 MG tablet Commonly known as:  CRESTOR Take 1 tablet (10 mg total) by mouth daily.   zolpidem 10 MG tablet Commonly known as:  AMBIEN Take 1 tablet by mouth at bedtime.            Durable Medical Equipment        Start     Ordered   11/25/16 1446  DME Walker rolling  Once    Question:  Patient needs a walker to treat with the following condition  Answer:  S/P total knee replacement using cement, right   11/25/16 1445   11/25/16 1446  DME 3 n 1  Once     11/25/16 1445   11/25/16 1446  DME Bedside commode  Once    Question:  Patient needs a bedside commode to treat with the following condition  Answer:  S/P total knee replacement using cement, right   11/25/16 1445      FOLLOW UP VISIT:   Follow-up Information    [facility] up.   Specialty:  Home Health Services Why:  A representative from [facility] Will contact you to arrange start date and time for  your therapy.Benay Pillow information: [address] [place] [phone]           DISPOSITION:   Home  CONDITION:  Stable  Norlene CampbellPeter Clearance Chenault, M.D. [facility] [phone]  11/27/2016 8:10 AM

## 2016-11-28 DIAGNOSIS — I2510 Atherosclerotic heart disease of native coronary artery without angina pectoris: Secondary | ICD-10-CM | POA: Diagnosis not present

## 2016-11-28 DIAGNOSIS — Z96652 Presence of left artificial knee joint: Secondary | ICD-10-CM | POA: Diagnosis not present

## 2016-11-28 DIAGNOSIS — I10 Essential (primary) hypertension: Secondary | ICD-10-CM | POA: Diagnosis not present

## 2016-11-28 DIAGNOSIS — Z471 Aftercare following joint replacement surgery: Secondary | ICD-10-CM | POA: Diagnosis not present

## 2016-11-28 DIAGNOSIS — I252 Old myocardial infarction: Secondary | ICD-10-CM | POA: Diagnosis not present

## 2016-11-28 DIAGNOSIS — Z951 Presence of aortocoronary bypass graft: Secondary | ICD-10-CM | POA: Diagnosis not present

## 2016-12-01 ENCOUNTER — Other Ambulatory Visit (INDEPENDENT_AMBULATORY_CARE_PROVIDER_SITE_OTHER): Payer: Self-pay | Admitting: Orthopaedic Surgery

## 2016-12-01 DIAGNOSIS — Z471 Aftercare following joint replacement surgery: Secondary | ICD-10-CM | POA: Diagnosis not present

## 2016-12-01 DIAGNOSIS — I2510 Atherosclerotic heart disease of native coronary artery without angina pectoris: Secondary | ICD-10-CM | POA: Diagnosis not present

## 2016-12-01 DIAGNOSIS — Z951 Presence of aortocoronary bypass graft: Secondary | ICD-10-CM | POA: Diagnosis not present

## 2016-12-01 DIAGNOSIS — I252 Old myocardial infarction: Secondary | ICD-10-CM | POA: Diagnosis not present

## 2016-12-01 DIAGNOSIS — I10 Essential (primary) hypertension: Secondary | ICD-10-CM | POA: Diagnosis not present

## 2016-12-01 DIAGNOSIS — Z96652 Presence of left artificial knee joint: Secondary | ICD-10-CM | POA: Diagnosis not present

## 2016-12-03 DIAGNOSIS — Z471 Aftercare following joint replacement surgery: Secondary | ICD-10-CM | POA: Diagnosis not present

## 2016-12-03 DIAGNOSIS — I252 Old myocardial infarction: Secondary | ICD-10-CM | POA: Diagnosis not present

## 2016-12-03 DIAGNOSIS — I10 Essential (primary) hypertension: Secondary | ICD-10-CM | POA: Diagnosis not present

## 2016-12-03 DIAGNOSIS — I2510 Atherosclerotic heart disease of native coronary artery without angina pectoris: Secondary | ICD-10-CM | POA: Diagnosis not present

## 2016-12-03 DIAGNOSIS — Z951 Presence of aortocoronary bypass graft: Secondary | ICD-10-CM | POA: Diagnosis not present

## 2016-12-03 DIAGNOSIS — Z96652 Presence of left artificial knee joint: Secondary | ICD-10-CM | POA: Diagnosis not present

## 2016-12-05 ENCOUNTER — Telehealth (INDEPENDENT_AMBULATORY_CARE_PROVIDER_SITE_OTHER): Payer: Self-pay | Admitting: Orthopaedic Surgery

## 2016-12-05 DIAGNOSIS — Z471 Aftercare following joint replacement surgery: Secondary | ICD-10-CM | POA: Diagnosis not present

## 2016-12-05 DIAGNOSIS — I2510 Atherosclerotic heart disease of native coronary artery without angina pectoris: Secondary | ICD-10-CM | POA: Diagnosis not present

## 2016-12-05 DIAGNOSIS — Z951 Presence of aortocoronary bypass graft: Secondary | ICD-10-CM | POA: Diagnosis not present

## 2016-12-05 DIAGNOSIS — I252 Old myocardial infarction: Secondary | ICD-10-CM | POA: Diagnosis not present

## 2016-12-05 DIAGNOSIS — I10 Essential (primary) hypertension: Secondary | ICD-10-CM | POA: Diagnosis not present

## 2016-12-05 DIAGNOSIS — Z96652 Presence of left artificial knee joint: Secondary | ICD-10-CM | POA: Diagnosis not present

## 2016-12-05 NOTE — Telephone Encounter (Signed)
Spoke with pt and he will take Tylenol or advil as needed for pain.

## 2016-12-05 NOTE — Telephone Encounter (Signed)
Patient would like to know if he can take Tylenol instead of the Oxycodone. Patient states Oxy makes him feel awful. Please call to advise.

## 2016-12-08 ENCOUNTER — Ambulatory Visit: Payer: Medicare Other | Admitting: Cardiology

## 2016-12-08 ENCOUNTER — Ambulatory Visit (INDEPENDENT_AMBULATORY_CARE_PROVIDER_SITE_OTHER): Payer: Medicare Other

## 2016-12-08 ENCOUNTER — Other Ambulatory Visit (INDEPENDENT_AMBULATORY_CARE_PROVIDER_SITE_OTHER): Payer: Self-pay

## 2016-12-08 ENCOUNTER — Encounter (INDEPENDENT_AMBULATORY_CARE_PROVIDER_SITE_OTHER): Payer: Self-pay | Admitting: Orthopaedic Surgery

## 2016-12-08 ENCOUNTER — Ambulatory Visit (INDEPENDENT_AMBULATORY_CARE_PROVIDER_SITE_OTHER): Payer: Medicare Other | Admitting: Orthopaedic Surgery

## 2016-12-08 VITALS — BP 151/86 | HR 72 | Resp 12 | Ht 74.0 in | Wt 250.0 lb

## 2016-12-08 DIAGNOSIS — Z96652 Presence of left artificial knee joint: Secondary | ICD-10-CM

## 2016-12-08 DIAGNOSIS — Z951 Presence of aortocoronary bypass graft: Secondary | ICD-10-CM | POA: Diagnosis not present

## 2016-12-08 DIAGNOSIS — I2510 Atherosclerotic heart disease of native coronary artery without angina pectoris: Secondary | ICD-10-CM | POA: Diagnosis not present

## 2016-12-08 DIAGNOSIS — I252 Old myocardial infarction: Secondary | ICD-10-CM | POA: Diagnosis not present

## 2016-12-08 DIAGNOSIS — I10 Essential (primary) hypertension: Secondary | ICD-10-CM | POA: Diagnosis not present

## 2016-12-08 DIAGNOSIS — Z471 Aftercare following joint replacement surgery: Secondary | ICD-10-CM | POA: Diagnosis not present

## 2016-12-08 NOTE — Progress Notes (Signed)
Office Visit Note   Patient: Joseph Kidd           Date of Birth: 07-Jun-1950           MRN: [id number] Visit Date: 12/08/2016              Requested by: April Manson, NP [address] [place] [address] PCP: April Manson, NP   Assessment & Plan: Visit Diagnoses:  1. History of left knee replacement     Plan: 2 weeks status post left total knee replacement doing well. Has taken very little pain medicine. Continues on the xarelto without any shortness of breath or chest pain. We'll start outpatient therapy at [facility] and return in 2 weeks. start his baby aspirin daily  Follow-Up Instructions: No Follow-up on file.   Orders:  Orders Placed This Encounter  Procedures  . XR KNEE 3 VIEW LEFT   No orders of the defined types were placed in this encounter.     Procedures: No procedures performed   Clinical Data: No additional findings.   Subjective: Chief Complaint  Patient presents with  . Left Knee - Routine Post Op    Joseph Kidd is a 66 y o that is 2 weeks status post Left TKA. He ambulates with a walker and is being weight. He realtes he takes tylenol for pain. Staples removed, steri strips applied.  Doing very well 2 weeks postoperative. Denies shortness of breath or chest pain fever or chills.  He is independent with a walker.  HPI  Review of Systems   Objective: Vital Signs: BP (!) 151/86   Pulse 72   Resp 12   Ht 6\' 2"  (1.88 m)   Wt 250 lb (113.4 kg)   BMI 32.10 kg/m   Physical Exam  Ortho Exam left knee healing without evidence of infection. Steri-Strips were applied after removal of surgical clips. No calf pain. Minimal ankle swelling. Neurovascular exam intact. Full passive extension about 80-85 of flexion no instability  Specialty Comments:  No specialty comments available.  Imaging: No results found.   PMFS History: Patient Active Problem List   Diagnosis Date Noted  . Primary osteoarthritis of left knee  11/25/2016  . Osteoarthritis of knee 11/25/2016  . S/P total knee replacement using cement, left 11/25/2016  . S/P CABG x 5   . Coronary artery disease 07/04/2016  . Non-ST elevation (NSTEMI) myocardial infarction (HCC) 07/03/2016  . HTN (hypertension) 07/03/2016  . Hypercholesterolemia 07/03/2016  . NSTEMI (non-ST elevated myocardial infarction) (HCC) 07/03/2016  . Chest pain 07/03/2016   Past Medical History:  Diagnosis Date  . Coronary artery disease   . DVT (deep venous thrombosis) (HCC)    right lower leg  . Hypercholesterolemia   . Hypertension   . Myocardial infarction (HCC)   . Osteoarthritis     Family History  Problem Relation Age of Onset  . Heart disease Mother   . Hypercholesterolemia Mother     Past Surgical History:  Procedure Laterality Date  . CARDIAC CATHETERIZATION    . CORONARY ARTERY BYPASS GRAFT N/A 07/04/2016   Procedure: CORONARY ARTERY BYPASS GRAFT times five with left internal mammary artery to Left Anterior Descending artery : endoscopic harvest of right saphenous vein with grafts to Diagonal, Right Coronary, Obtuse Marginal 1, Obtuse Marginal 3 Arteries. Insertion of right groin femoral a line;  Surgeon: Delight Ovens, MD;  Location: [facility] OR;  Service: Open Heart Surgery;  Laterality: N/A;  .  INTRAOPERATIVE TRANSESOPHAGEAL ECHOCARDIOGRAM N/A 07/04/2016   Procedure: INTRAOPERATIVE TRANSESOPHAGEAL ECHOCARDIOGRAM;  Surgeon: Delight OvensEdward B Gerhardt, MD;  [facility];  Service: Open Heart Surgery;  Laterality: N/A;  . LEFT HEART CATH AND CORONARY ANGIOGRAPHY N/A 07/03/2016   Procedure: Left Heart Cath and Coronary Angiography;  Surgeon: Alfredo Collymore M SwazilandJordan, MD;  [facility];  Service: Cardiovascular;  Laterality: N/A;  . TOTAL KNEE ARTHROPLASTY Left 11/25/2016   Procedure: LEFT TOTAL KNEE ARTHROPLASTY;  Surgeon: Valeria BatmanWhitfield, Hollyn Stucky W, MD;  Location: [facility] OR;  Service: Orthopedics;  Laterality: Left;   Social History   Occupational History  . retired  Copywriter, advertisinglineman    Social History Main Topics  . Smoking status: Never Smoker  . Smokeless tobacco: Never Used  . Alcohol use Yes     Comment: very seldom  . Drug use: No  . Sexual activity: Not on file     Valeria BatmanPeter W Verdelle Valtierra, MD   Note - This record has been created using [employer] software.  Chart creation errors have been sought, but may not always  have been located. Such creation errors do not reflect on  the standard of medical care.

## 2016-12-08 NOTE — Progress Notes (Deleted)
Office Visit Note   Patient: Joseph Kidd           Date of Birth: 11-28-1950           MRN: [id number] Visit Date: 12/08/2016              Requested by: April Manson, NP [address] [place] [address] PCP: April Manson, NP   Assessment & Plan: Visit Diagnoses:  1. History of left knee replacement     Plan: ***  Follow-Up Instructions: No Follow-up on file.   Orders:  Orders Placed This Encounter  Procedures  . XR KNEE 3 VIEW LEFT   No orders of the defined types were placed in this encounter.     Procedures: No procedures performed   Clinical Data: No additional findings.   Subjective: No chief complaint on file.   HPI  Review of Systems  Constitutional: Negative for fatigue.  HENT: Negative for hearing loss.   Respiratory: Negative for apnea, chest tightness and shortness of breath.   Cardiovascular: Negative for chest pain, palpitations and leg swelling.  Gastrointestinal: Negative for blood in stool, constipation and diarrhea.  Genitourinary: Negative for difficulty urinating.  Musculoskeletal: Negative for arthralgias, back pain, joint swelling, myalgias, neck pain and neck stiffness.  Neurological: Negative for weakness, numbness and headaches.  Hematological: Does not bruise/bleed easily.  Psychiatric/Behavioral: Negative for sleep disturbance. The patient is not nervous/anxious.   All other systems reviewed and are negative.    Objective: Vital Signs: There were no vitals taken for this visit.  Physical Exam  Ortho Exam  Specialty Comments:  No specialty comments available.  Imaging: No results found.   PMFS History: Patient Active Problem List   Diagnosis Date Noted  . Primary osteoarthritis of left knee 11/25/2016  . Osteoarthritis of knee 11/25/2016  . S/P total knee replacement using cement, left 11/25/2016  . S/P CABG x 5   . Coronary artery disease 07/04/2016  . Non-ST elevation (NSTEMI) myocardial  infarction (HCC) 07/03/2016  . HTN (hypertension) 07/03/2016  . Hypercholesterolemia 07/03/2016  . NSTEMI (non-ST elevated myocardial infarction) (HCC) 07/03/2016  . Chest pain 07/03/2016   Past Medical History:  Diagnosis Date  . Coronary artery disease   . DVT (deep venous thrombosis) (HCC)    right lower leg  . Hypercholesterolemia   . Hypertension   . Myocardial infarction (HCC)   . Osteoarthritis     Family History  Problem Relation Age of Onset  . Heart disease Mother   . Hypercholesterolemia Mother     Past Surgical History:  Procedure Laterality Date  . CARDIAC CATHETERIZATION    . CORONARY ARTERY BYPASS GRAFT N/A 07/04/2016   Procedure: CORONARY ARTERY BYPASS GRAFT times five with left internal mammary artery to Left Anterior Descending artery : endoscopic harvest of right saphenous vein with grafts to Diagonal, Right Coronary, Obtuse Marginal 1, Obtuse Marginal 3 Arteries. Insertion of right groin femoral a line;  Surgeon: Delight Ovens, MD;  Location: [facility] OR;  Service: Open Heart Surgery;  Laterality: N/A;  . INTRAOPERATIVE TRANSESOPHAGEAL ECHOCARDIOGRAM N/A 07/04/2016   Procedure: INTRAOPERATIVE TRANSESOPHAGEAL ECHOCARDIOGRAM;  Surgeon: Delight Ovens, MD;  [facility] OR;  Service: Open Heart Surgery;  Laterality: N/A;  . LEFT HEART CATH AND CORONARY ANGIOGRAPHY N/A 07/03/2016   Procedure: Left Heart Cath and Coronary Angiography;  Surgeon: Peter M Swaziland, MD;  Location[facility]VAS[facility] CV LAB;  Service: Cardiovascular;  Laterality: N/A;  . TOTAL  KNEE ARTHROPLASTY Left 11/25/2016   Procedure: LEFT TOTAL KNEE ARTHROPLASTY;  Surgeon: Valeria BatmanWhitfield, Peter W, MD;  [facility];  Service: Orthopedics;  Laterality: Left;   Social History   Occupational History  . retired Copywriter, advertisinglineman    Social History Main Topics  . Smoking status: Never Smoker  . Smokeless tobacco: Never Used  . Alcohol use Yes     Comment: very seldom  . Drug use: No  . Sexual activity: Not on file

## 2016-12-12 DIAGNOSIS — M1711 Unilateral primary osteoarthritis, right knee: Secondary | ICD-10-CM | POA: Diagnosis not present

## 2016-12-15 DIAGNOSIS — M1711 Unilateral primary osteoarthritis, right knee: Secondary | ICD-10-CM | POA: Diagnosis not present

## 2016-12-17 DIAGNOSIS — M1711 Unilateral primary osteoarthritis, right knee: Secondary | ICD-10-CM | POA: Diagnosis not present

## 2016-12-19 DIAGNOSIS — M1711 Unilateral primary osteoarthritis, right knee: Secondary | ICD-10-CM | POA: Diagnosis not present

## 2016-12-22 ENCOUNTER — Encounter (INDEPENDENT_AMBULATORY_CARE_PROVIDER_SITE_OTHER): Payer: Self-pay | Admitting: Orthopaedic Surgery

## 2016-12-22 ENCOUNTER — Ambulatory Visit (INDEPENDENT_AMBULATORY_CARE_PROVIDER_SITE_OTHER): Payer: Medicare Other | Admitting: Orthopaedic Surgery

## 2016-12-22 VITALS — BP 116/58 | HR 78 | Resp 14 | Ht 71.0 in | Wt 250.0 lb

## 2016-12-22 DIAGNOSIS — M1711 Unilateral primary osteoarthritis, right knee: Secondary | ICD-10-CM | POA: Diagnosis not present

## 2016-12-22 DIAGNOSIS — Z96652 Presence of left artificial knee joint: Secondary | ICD-10-CM

## 2016-12-22 NOTE — Progress Notes (Signed)
Office Visit Note   Patient: Joseph Kidd           Date of Birth: [birth year]           MRN: [id number] Visit Date: 12/22/2016              Requested by: April Manson, NP [address], [place] [address] PCP: April Manson, NP   Assessment & Plan: Visit Diagnoses:  1. History of left knee replacement   1 month status post left total knee replacement doing very well. Mr. Radwan is pleased with his progress.  Plan: 2 new strengthening exercises a range of motion and return to the office in 2 months  Follow-Up Instructions: Return in about 2 months (around 02/21/2017).   Orders:  No orders of the defined types were placed in this encounter.  No orders of the defined types were placed in this encounter.     Procedures: No procedures performed   Clinical Data: No additional findings.   Subjective: Chief Complaint  Patient presents with  . Left Knee - Routine Post Op    Mr. Lienemann is a 66 y o that is 1 month status post Left knee replacement. No calf pain, little swelling  Denies fever or chills shortness of breath or chest pain. Presently not using any ambulatory aid. Being followed in physical therapy with excellent progress. Not taking any pain medicine.  HPI  Review of Systems   Objective: Vital Signs: BP (!) 116/58   Pulse 78   Resp 14   Ht 5\' 11"  (1.803 m)   Wt 250 lb (113.4 kg)   BMI 34.87 kg/m   Physical Exam  Ortho Exam left knee with mild induration and swelling consistent with a recent knee replacement. Incision is healed nicely without a problem. Full quick knee extension and 105 of flexion. Mild ankle swelling but no calf pain. Neurovascular exam intact. Straight leg raise negative. Painless range of motion of hip. No instability. No popping or clicking left knee  No specialty comments available.  Imaging: No results found.   PMFS History: Patient Active Problem List   Diagnosis Date Noted  . Primary osteoarthritis  of left knee 11/25/2016  . Osteoarthritis of knee 11/25/2016  . S/P total knee replacement using cement, left 11/25/2016  . S/P CABG x 5   . Coronary artery disease 07/04/2016  . Non-ST elevation (NSTEMI) myocardial infarction (HCC) 07/03/2016  . HTN (hypertension) 07/03/2016  . Hypercholesterolemia 07/03/2016  . NSTEMI (non-ST elevated myocardial infarction) (HCC) 07/03/2016  . Chest pain 07/03/2016   Past Medical History:  Diagnosis Date  . Coronary artery disease   . DVT (deep venous thrombosis) (HCC)    right lower leg  . Hypercholesterolemia   . Hypertension   . Myocardial infarction (HCC)   . Osteoarthritis     Family History  Problem Relation Age of Onset  . Heart disease Mother   . Hypercholesterolemia Mother     Past Surgical History:  Procedure Laterality Date  . CARDIAC CATHETERIZATION    . CORONARY ARTERY BYPASS GRAFT N/A 07/04/2016   Procedure: CORONARY ARTERY BYPASS GRAFT times five with left internal mammary artery to Left Anterior Descending artery : endoscopic harvest of right saphenous vein with grafts to Diagonal, Right Coronary, Obtuse Marginal 1, Obtuse Marginal 3 Arteries. Insertion of right groin femoral a line;  Surgeon: Delight Ovens, MD;  Location: [facility] OR;  Service: Open Heart Surgery;  Laterality: N/A;  .  INTRAOPERATIVE TRANSESOPHAGEAL ECHOCARDIOGRAM N/A 07/04/2016   Procedure: INTRAOPERATIVE TRANSESOPHAGEAL ECHOCARDIOGRAM;  Surgeon: Delight Ovens, MD;  Location: [facility] OR;  Service: Open Heart Surgery;  Laterality: N/A;  . LEFT HEART CATH AND CORONARY ANGIOGRAPHY N/A 07/03/2016   Procedure: Left Heart Cath and Coronary Angiography;  Surgeon: Shatia Sindoni M Swaziland, MD;  Location: [facility] INVASIVE CV LAB;  Service: Cardiovascular;  Laterality: N/A;  . TOTAL KNEE ARTHROPLASTY Left 11/25/2016   Procedure: LEFT TOTAL KNEE ARTHROPLASTY;  Surgeon: Valeria Batman, MD;  Location: [facility] OR;  Service: Orthopedics;  Laterality: Left;   Social History   Occupational History    . retired Copywriter, advertising    Social History Main Topics  . Smoking status: Never Smoker  . Smokeless tobacco: Never Used  . Alcohol use Yes     Comment: very seldom  . Drug use: No  . Sexual activity: Not on file     Valeria Batman, MD   Note - This record has been created using [employer].  Chart creation errors have been sought, but may not always  have been located. Such creation errors do not reflect on  the standard of medical care.

## 2016-12-24 DIAGNOSIS — M1711 Unilateral primary osteoarthritis, right knee: Secondary | ICD-10-CM | POA: Diagnosis not present

## 2016-12-30 DIAGNOSIS — M1711 Unilateral primary osteoarthritis, right knee: Secondary | ICD-10-CM | POA: Diagnosis not present

## 2016-12-31 DIAGNOSIS — M1711 Unilateral primary osteoarthritis, right knee: Secondary | ICD-10-CM | POA: Diagnosis not present

## 2017-01-02 DIAGNOSIS — M1711 Unilateral primary osteoarthritis, right knee: Secondary | ICD-10-CM | POA: Diagnosis not present

## 2017-01-05 DIAGNOSIS — M1711 Unilateral primary osteoarthritis, right knee: Secondary | ICD-10-CM | POA: Diagnosis not present

## 2017-01-09 DIAGNOSIS — M1711 Unilateral primary osteoarthritis, right knee: Secondary | ICD-10-CM | POA: Diagnosis not present

## 2017-01-12 DIAGNOSIS — M1711 Unilateral primary osteoarthritis, right knee: Secondary | ICD-10-CM | POA: Diagnosis not present

## 2017-01-15 ENCOUNTER — Other Ambulatory Visit: Payer: Self-pay

## 2017-01-15 MED ORDER — ROSUVASTATIN CALCIUM 10 MG PO TABS: 10.0000 mg | ORAL_TABLET | Freq: Every day | ORAL | 1 refills | Status: DC

## 2017-01-16 DIAGNOSIS — M1711 Unilateral primary osteoarthritis, right knee: Secondary | ICD-10-CM | POA: Diagnosis not present

## 2017-01-19 DIAGNOSIS — M1711 Unilateral primary osteoarthritis, right knee: Secondary | ICD-10-CM | POA: Diagnosis not present

## 2017-01-26 DIAGNOSIS — M1711 Unilateral primary osteoarthritis, right knee: Secondary | ICD-10-CM | POA: Diagnosis not present

## 2017-02-05 NOTE — Progress Notes (Signed)
Cardiology Office Note    Date:  02/09/2017   ID:  Joseph Kidd, DOB 16-Nov-1950, MRN [id number]  PCP:  April Manson, NP  Cardiologist:  Dr. Ulis Kaps [place]  Chief Complaint  Patient presents with  . Follow-up    3 months;  . Coronary Artery Disease    History of Present Illness:  Joseph Kidd is a 66 y.o. male with PMH of HTN, HLD intolerant to statins, OA and  CAD s/p CABG. He presented to the hospital with inferior STEMI on 07/03/2016. Emergent cardiac catheterization performed on 07/03/16 showed 95% proximal to mid RCA stenosis, 30% ostial to proximal RCA stenosis, 95% proximal LAD, 80% mid LAD, 100% occluded proximal left circumflex with left to left and right-to-left collaterals, 90% ostial OM1 lesion, EF 50-55%. Echocardiogram performed on the same day showed EF 60-65%, mild LVH, dilated aortic root up to 40 mm, mild MR. He underwent 5 vessel CABG with LIMA to LAD, SVG to diagonal, sequential SVG to OM1 and OM 3, SVG to RCA by Dr. Tyrone Sage on 07/04/2013. Post procedure, the hospital course involved volume overload requiring diuresis and also atrial fibrillation with RVR which converted on IV amiodarone. He was also initiated on Crestor.  When seen in follow up April 2 he complained of persistent wheezing and beta blocker was discontinued. Lisinopril had been discontinued earlier.  He was maintaining NSR and amiodarone was stopped. He was noted to have right leg swelling. LE venous dopplers demonstrated right peroneal vein DVT. He was started on Xarelto and completed his course of therapy in July. In August he did undergo left TKR. He was given DVT prophylaxis with Xarelto.   He tolerated his knee surgery well. Now back on ASA. He has lost an additional 36 lbs. Will eventually need right knee surgery. No chest pain or SOB. Complains of insomnia.     Past Medical History:  Diagnosis Date  . Coronary artery disease   . DVT (deep venous thrombosis) (HCC)    right lower leg  .  Hypercholesterolemia   . Hypertension   . Myocardial infarction (HCC)   . Osteoarthritis     Past Surgical History:  Procedure Laterality Date  . CARDIAC CATHETERIZATION    . CORONARY ARTERY BYPASS GRAFT N/A 07/04/2016   Procedure: CORONARY ARTERY BYPASS GRAFT times five with left internal mammary artery to Left Anterior Descending artery : endoscopic harvest of right saphenous vein with grafts to Diagonal, Right Coronary, Obtuse Marginal 1, Obtuse Marginal 3 Arteries. Insertion of right groin femoral a line;  Surgeon: Delight Ovens, MD;  Location: [facility] OR;  Service: Open Heart Surgery;  Laterality: N/A;  . INTRAOPERATIVE TRANSESOPHAGEAL ECHOCARDIOGRAM N/A 07/04/2016   Procedure: INTRAOPERATIVE TRANSESOPHAGEAL ECHOCARDIOGRAM;  Surgeon: Delight Ovens, MD;  Location: [facility];  Service: Open Heart Surgery;  Laterality: N/A;  . LEFT HEART CATH AND CORONARY ANGIOGRAPHY N/A 07/03/2016   Procedure: Left Heart Cath and Coronary Angiography;  Surgeon: Amira Podolak M [place], MD;  [facility] INVASIVE CV LAB;  Service: Cardiovascular;  Laterality: N/A;  . TOTAL KNEE ARTHROPLASTY Left 11/25/2016   Procedure: LEFT TOTAL KNEE ARTHROPLASTY;  Surgeon: Valeria Batman, MD;  Location: [facility] OR;  Service: Orthopedics;  Laterality: Left;    Current Medications: Outpatient Medications Prior to Visit  Medication Sig Dispense Refill  . diltiazem (CARDIZEM CD) 120 MG 24 hr capsule Take 1 capsule (120 mg total) by mouth daily. (Patient taking differently: Take 120 mg by mouth daily. ) 90 capsule 3  .  methocarbamol (ROBAXIN) 500 MG tablet Take 1 tablet (500 mg total) by mouth every 6 (six) hours as needed for muscle spasms. 30 tablet 0  . rosuvastatin (CRESTOR) 10 MG tablet Take 1 tablet (10 mg total) by mouth daily. 90 tablet 1  . zolpidem (AMBIEN) 10 MG tablet Take 1 tablet by mouth at bedtime.    Marland Kitchen diltiazem (CARTIA XT) 120 MG 24 hr capsule      No facility-administered medications prior to visit.      Allergies:    Atorvastatin and Chlorhexidine   Social History   Social History  . Marital status: Married    Spouse name: N/A  . Number of children: 1  . Years of education: N/A   Occupational History  . retired Copywriter, advertising    Social History Main Topics  . Smoking status: Never Smoker  . Smokeless tobacco: Never Used  . Alcohol use Yes     Comment: very seldom  . Drug use: No  . Sexual activity: Not Asked   Other Topics Concern  . None   Social History Narrative  . None     Family History:  The patient's family history includes Heart disease in his mother; Hypercholesterolemia in his mother.   ROS:   Please see the history of present illness.    ROS All other systems reviewed and are negative.   PHYSICAL EXAM:   VS:  BP (!) 142/86   Pulse 75   Ht  (1.778 m)   Wt 214 lb 6.4 oz (97.3 kg)   BMI 30.76 kg/m    GENERAL:  Well appearing WM in NAD HEENT:  PERRL, EOMI, sclera are clear. Oropharynx is clear. NECK:  No jugular venous distention, carotid upstroke brisk and symmetric, no bruits, no thyromegaly or adenopathy LUNGS:  Clear to auscultation bilaterally CHEST:  Unremarkable HEART:  RRR,  PMI not displaced or sustained,S1 and S2 within normal limits, no S3, no S4: no clicks, no rubs, no murmurs ABD:  Soft, nontender. BS +, no masses or bruits. No hepatomegaly, no splenomegaly EXT:  2 + pulses throughout, no edema, no cyanosis no clubbing SKIN:  Warm and dry.  No rashes NEURO:  Alert and oriented x 3. Cranial nerves II through XII intact. PSYCH:  Cognitively intact    Wt Readings from Last 3 Encounters:  02/09/17 214 lb 6.4 oz (97.3 kg)  12/22/16 250 lb (113.4 kg)  12/08/16 250 lb (113.4 kg)      Studies/Labs Reviewed:   EKG:  EKG is not ordered today.    Recent Labs: 07/05/2016: Magnesium 2.4 11/12/2016: ALT 26 11/27/2016: BUN 17; Creatinine, Ser 0.98; Hemoglobin 11.0; Platelets 196; Potassium 4.3; Sodium 139   Lipid Panel    Component Value Date/Time   CHOL  146 08/26/2016 1236   TRIG 95 08/26/2016 1236   HDL 27 (L) 08/26/2016 1236   CHOLHDL 5.4 (H) 08/26/2016 1236   VLDL 19 08/26/2016 1236   LDLCALC 100 (H) 08/26/2016 1236    Additional studies/ records that were reviewed today include:   Cath 07/03/2016 Conclusion     Prox RCA to Mid RCA lesion, 95 %stenosed.  Ost RCA to Prox RCA lesion, 30 %stenosed.  Mid RCA lesion, 40 %stenosed.  Prox LAD lesion, 95 %stenosed.  Mid LAD-2 lesion, 75 %stenosed.  Mid LAD-1 lesion, 80 %stenosed.  Ost 1st Mrg to 1st Mrg lesion, 90 %stenosed.  Prox Cx to Mid Cx lesion, 100 %stenosed.  The left ventricular systolic function is normal.  LV end diastolic pressure is normal.  The left ventricular ejection fraction is 50-55% by visual estimate.   1. Critical 3 vessel obstructive CAD.     - complex 95% proximal LAD, 75-80% diffuse mid LAD    - 90% OM1    - 100% mid LCx with left to left and right to left collaterals    - 95% proximal RCA 2. Good LV function 3. Normal LVEDP  Plan: Patient is currently pain free- will aggressively threat medically with ASA, IV Ntg, IV heparin, beta blocker. Consult CT surgery  for CABG.      Echo 07/03/2016 LV EF: 60% -   65%  ------------------------------------------------------------------- Indications:      Chest pain 786.51.  ------------------------------------------------------------------- History:   Risk factors:  NSTEMI. Hypertension. Dyslipidemia.  ------------------------------------------------------------------- Study Conclusions  - Left ventricle: The cavity size was normal. Wall thickness was   increased in a pattern of mild LVH. Systolic function was normal.   The estimated ejection fraction was in the range of 60% to 65%.   Wall motion was normal; there were no regional wall motion   abnormalities. Left ventricular diastolic function parameters   were normal. - Aortic valve: Mildly calcified annulus. There was trivial    regurgitation. - Aorta: Mild aortic root dilatation. Aortic root dimension: 40 mm   (ED). - Mitral valve: There was mild regurgitation.    CABG x 5 by Dr. Tyrone Sage 07/04/2016 PRE-OPERATIVE DIAGNOSIS:  1. S/p NSTEMI 2. Coronary artery disease  POST-OPERATIVE DIAGNOSIS:  1. S/p NSTEMI 2. Coronary artery disease  PROCEDURE: INTRAOPERATIVE TRANSESOPHAGEAL ECHOCARDIOGRAM, MEDIAN STERNOTOMY for CORONARY ARTERY BYPASS GRAFT x 5 (LIMA to LAD, SVG to DIAGONAL, SVG SEQUENTIALLY to OM1 and OM3, and SVG to RCA) with  endoscopic harvest of right greater saphenous vein with grafts to    ASSESSMENT:    No diagnosis found.   PLAN:  In order of problems listed above:  1. CAD s/p CABG in March 2018: he is asymptomatic.   Continue ASA daily. On Cardizemfor BP control.   2. RLE peroneal DVT: completed course of anticoagulation.   3. HTN: Blood pressure stable  4. HLD: Intolerant to Lipitor as before, he was started on Crestor 5 mg daily, then increased to 10 mg daily. Tolerating well. Check in early November. Hopefully with substantial weight loss we will see further improvement.  5. Severe osteoarthritis knees. S/p left TKR.    Medication Adjustments/Labs and Tests Ordered: Current medicines are reviewed at length with the patient today.  Concerns regarding medicines are outlined above.  Medication changes, Labs and Tests ordered today are listed in the Patient Instructions below. Patient Instructions  Continue your current therapy  I will see you in 6 months  We will await your lab results in November      Signed, Casy Brunetto [place], MD  02/09/2017 11:26 AM    [facility] [address] [place], [place], [place]  [address] Phone: [phone]; Fax: [phone]

## 2017-02-09 ENCOUNTER — Ambulatory Visit (INDEPENDENT_AMBULATORY_CARE_PROVIDER_SITE_OTHER): Payer: Medicare Other | Admitting: Cardiology

## 2017-02-09 ENCOUNTER — Encounter: Payer: Self-pay | Admitting: Cardiology

## 2017-02-09 VITALS — BP 142/86 | HR 75 | Ht 70.0 in | Wt 214.4 lb

## 2017-02-09 DIAGNOSIS — I10 Essential (primary) hypertension: Secondary | ICD-10-CM | POA: Diagnosis not present

## 2017-02-09 DIAGNOSIS — I25810 Atherosclerosis of coronary artery bypass graft(s) without angina pectoris: Secondary | ICD-10-CM

## 2017-02-09 DIAGNOSIS — Z951 Presence of aortocoronary bypass graft: Secondary | ICD-10-CM | POA: Diagnosis not present

## 2017-02-09 DIAGNOSIS — E7800 Pure hypercholesterolemia, unspecified: Secondary | ICD-10-CM

## 2017-02-09 MED ORDER — ASPIRIN EC 81 MG PO TBEC
81.0000 mg | DELAYED_RELEASE_TABLET | Freq: Every day | ORAL | 3 refills | Status: DC
Start: 2017-02-09 — End: 2017-10-13

## 2017-02-09 NOTE — Patient Instructions (Signed)
Continue your current therapy  I will see you in 6 months  We will await your lab results in November

## 2017-02-16 DIAGNOSIS — Z23 Encounter for immunization: Secondary | ICD-10-CM | POA: Diagnosis not present

## 2017-02-20 ENCOUNTER — Ambulatory Visit (INDEPENDENT_AMBULATORY_CARE_PROVIDER_SITE_OTHER): Payer: Medicare Other | Admitting: Orthopaedic Surgery

## 2017-02-20 ENCOUNTER — Encounter (INDEPENDENT_AMBULATORY_CARE_PROVIDER_SITE_OTHER): Payer: Self-pay | Admitting: Orthopaedic Surgery

## 2017-02-20 VITALS — BP 139/87 | HR 70 | Resp 14 | Ht 70.0 in | Wt 195.0 lb

## 2017-02-20 DIAGNOSIS — Z96652 Presence of left artificial knee joint: Secondary | ICD-10-CM

## 2017-02-20 NOTE — Progress Notes (Signed)
Office Visit Note   Patient: Joseph Kidd           Date of Birth: [birth year]           MRN: [id number] Visit Date: 02/20/2017              Requested by: April Manson, NP [address], [place] [address] PCP: April Manson, NP   Assessment & Plan: Visit Diagnoses:  1. History of left knee replacement     Plan: 3 months status post left total knee replacement and very happy with the results. Not taking any medicine. Not using any ambulatory aid. We'll encourage him to continue with his exercises and return in 6 months  Follow-Up Instructions: Return in about 6 months (around 08/21/2017).   Orders:  No orders of the defined types were placed in this encounter.  No orders of the defined types were placed in this encounter.     Procedures: No procedures performed   Clinical Data: No additional findings.   Subjective: Chief Complaint  Patient presents with  . Left Knee - Routine Post Op    Joseph Kidd is a 66 y o S/P 3 months left TKA. He relates his L knee still swells , next day swelling goes down. Intermittent pain.   Very pleased with the results. No present issues with his left knee  HPI  Review of Systems  Constitutional: Negative for fatigue.  HENT: Negative for hearing loss.   Respiratory: Negative for apnea, chest tightness and shortness of breath.   Cardiovascular: Negative for chest pain, palpitations and leg swelling.  Gastrointestinal: Negative for blood in stool, constipation and diarrhea.  Genitourinary: Negative for difficulty urinating.  Musculoskeletal: Negative for arthralgias, back pain, joint swelling, myalgias, neck pain and neck stiffness.  Neurological: Negative for weakness, numbness and headaches.  Hematological: Does not bruise/bleed easily.  Psychiatric/Behavioral: Positive for sleep disturbance. The patient is not nervous/anxious.      Objective: Vital Signs: BP 139/87   Pulse 70   Resp 14   Ht 5\' 10"  (1.778  m)   Wt 195 lb (88.5 kg)   BMI 27.98 kg/m   Physical Exam  Ortho Exam awake alert and oriented 3. Comfortable sitting. Walks without a limp. Full quick extension of his left knee without effusion. No instability. Overall 105 of flexion. No swelling distally. Neurovascular exam intact.  Specialty Comments:  No specialty comments available.  Imaging: No results found.   PMFS History: Patient Active Problem List   Diagnosis Date Noted  . Primary osteoarthritis of left knee 11/25/2016  . Osteoarthritis of knee 11/25/2016  . S/P total knee replacement using cement, left 11/25/2016  . S/P CABG x 5   . Coronary artery disease 07/04/2016  . Non-ST elevation (NSTEMI) myocardial infarction (HCC) 07/03/2016  . HTN (hypertension) 07/03/2016  . Hypercholesterolemia 07/03/2016  . NSTEMI (non-ST elevated myocardial infarction) (HCC) 07/03/2016  . Chest pain 07/03/2016   Past Medical History:  Diagnosis Date  . Coronary artery disease   . DVT (deep venous thrombosis) (HCC)    right lower leg  . Hypercholesterolemia   . Hypertension   . Myocardial infarction (HCC)   . Osteoarthritis     Family History  Problem Relation Age of Onset  . Heart disease Mother   . Hypercholesterolemia Mother     Past Surgical History:  Procedure Laterality Date  . CARDIAC CATHETERIZATION    . CORONARY ARTERY BYPASS GRAFT N/A 07/04/2016  Procedure: CORONARY ARTERY BYPASS GRAFT times five with left internal mammary artery to Left Anterior Descending artery : endoscopic harvest of right saphenous vein with grafts to Diagonal, Right Coronary, Obtuse Marginal 1, Obtuse Marginal 3 Arteries. Insertion of right groin femoral a line;  Surgeon: Delight OvensEdward B Gerhardt, MD;  Location: [facility] OR;  Service: Open Heart Surgery;  Laterality: N/A;  . INTRAOPERATIVE TRANSESOPHAGEAL ECHOCARDIOGRAM N/A 07/04/2016   Procedure: INTRAOPERATIVE TRANSESOPHAGEAL ECHOCARDIOGRAM;  Surgeon: Delight OvensEdward B Gerhardt, MD;  [facility];  Service:  Open Heart Surgery;  Laterality: N/A;  . LEFT HEART CATH AND CORONARY ANGIOGRAPHY N/A 07/03/2016   Procedure: Left Heart Cath and Coronary Angiography;  Surgeon: Rosaleah Person M [place], MD;  [facility];  Service: Cardiovascular;  Laterality: N/A;  . TOTAL KNEE ARTHROPLASTY Left 11/25/2016   Procedure: LEFT TOTAL KNEE ARTHROPLASTY;  Surgeon: Valeria BatmanWhitfield, Taishaun Levels W, MD;  Location: [facility] OR;  Service: Orthopedics;  Laterality: Left;   Social History   Occupational History  . retired Copywriter, [employer]    Social History Main Topics  . Smoking status: Never Smoker  . Smokeless tobacco: Never Used  . Alcohol use Yes     Comment: very seldom  . Drug use: No  . Sexual activity: Not on file

## 2017-03-02 DIAGNOSIS — E7800 Pure hypercholesterolemia, unspecified: Secondary | ICD-10-CM | POA: Diagnosis not present

## 2017-03-02 DIAGNOSIS — I25810 Atherosclerosis of coronary artery bypass graft(s) without angina pectoris: Secondary | ICD-10-CM | POA: Diagnosis not present

## 2017-03-02 LAB — LIPID PANEL
CHOL/HDL RATIO: 4.3 (calc) (ref <5.0)
Cholesterol: 146 mg/dL (ref <200)
HDL: 34 mg/dL — ABNORMAL LOW (ref >40)
LDL CHOLESTEROL (CALC): 95 mg/dL
NON-HDL CHOLESTEROL (CALC): 112 mg/dL (ref <130)
Triglycerides: 76 mg/dL (ref <150)

## 2017-03-02 LAB — HEPATIC FUNCTION PANEL
AG Ratio: 1.2 (calc) (ref 1.0–2.5)
ALKALINE PHOSPHATASE (APISO): 94 U/L (ref 40–115)
ALT: 23 U/L (ref 9–46)
AST: 17 U/L (ref 10–35)
Albumin: 4.1 g/dL (ref 3.6–5.1)
BILIRUBIN INDIRECT: 0.3 mg/dL (ref 0.2–1.2)
Bilirubin, Direct: 0.1 mg/dL (ref 0.0–0.2)
Globulin: 3.3 g/dL (calc) (ref 1.9–3.7)
TOTAL PROTEIN: 7.4 g/dL (ref 6.1–8.1)
Total Bilirubin: 0.4 mg/dL (ref 0.2–1.2)

## 2017-03-05 ENCOUNTER — Other Ambulatory Visit: Payer: Self-pay

## 2017-03-05 DIAGNOSIS — I25810 Atherosclerosis of coronary artery bypass graft(s) without angina pectoris: Secondary | ICD-10-CM

## 2017-03-05 DIAGNOSIS — E7800 Pure hypercholesterolemia, unspecified: Secondary | ICD-10-CM

## 2017-03-05 MED ORDER — EZETIMIBE 10 MG PO TABS: 10.0000 mg | ORAL_TABLET | Freq: Every day | ORAL | 6 refills | Status: DC

## 2017-03-05 NOTE — Progress Notes (Signed)
zetia 

## 2017-05-25 DIAGNOSIS — Z1211 Encounter for screening for malignant neoplasm of colon: Secondary | ICD-10-CM | POA: Diagnosis not present

## 2017-05-25 DIAGNOSIS — I10 Essential (primary) hypertension: Secondary | ICD-10-CM | POA: Diagnosis not present

## 2017-05-25 DIAGNOSIS — G4700 Insomnia, unspecified: Secondary | ICD-10-CM | POA: Diagnosis not present

## 2017-05-25 DIAGNOSIS — Z23 Encounter for immunization: Secondary | ICD-10-CM | POA: Diagnosis not present

## 2017-05-25 DIAGNOSIS — Z0000 Encounter for general adult medical examination without abnormal findings: Secondary | ICD-10-CM | POA: Diagnosis not present

## 2017-05-25 DIAGNOSIS — Z79899 Other long term (current) drug therapy: Secondary | ICD-10-CM | POA: Diagnosis not present

## 2017-06-09 DIAGNOSIS — Z1212 Encounter for screening for malignant neoplasm of rectum: Secondary | ICD-10-CM | POA: Diagnosis not present

## 2017-06-09 DIAGNOSIS — Z1211 Encounter for screening for malignant neoplasm of colon: Secondary | ICD-10-CM | POA: Diagnosis not present

## 2017-07-09 ENCOUNTER — Other Ambulatory Visit: Payer: Self-pay | Admitting: Physician Assistant

## 2017-07-09 NOTE — Telephone Encounter (Signed)
Refill Request.  

## 2017-07-23 ENCOUNTER — Telehealth: Payer: Self-pay | Admitting: Cardiology

## 2017-07-23 MED ORDER — ROSUVASTATIN CALCIUM 10 MG PO TABS: 10.0000 mg | ORAL_TABLET | Freq: Every day | ORAL | 0 refills | Status: DC

## 2017-07-23 NOTE — Telephone Encounter (Signed)
New message    *STAT* If patient is at the pharmacy, call can be transferred to refill team.   1. Which medications need to be refilled? (please list name of each medication and dose if known) rosuvastatin (CRESTOR) 10 MG tablet  2. Which pharmacy/location (including street and city if local pharmacy) is medication to be sent to?[facility] [place] - [address]  3. Do they need a 30 day or 90 day supply? 90

## 2017-08-24 ENCOUNTER — Encounter (INDEPENDENT_AMBULATORY_CARE_PROVIDER_SITE_OTHER): Payer: Self-pay | Admitting: Orthopaedic Surgery

## 2017-08-24 ENCOUNTER — Ambulatory Visit (INDEPENDENT_AMBULATORY_CARE_PROVIDER_SITE_OTHER): Payer: Medicare Other | Admitting: Orthopaedic Surgery

## 2017-08-24 VITALS — BP 177/100 | HR 66 | Resp 16 | Ht 71.0 in | Wt 200.0 lb

## 2017-08-24 DIAGNOSIS — Z96652 Presence of left artificial knee joint: Secondary | ICD-10-CM

## 2017-08-24 NOTE — Progress Notes (Signed)
Cardiology Office Note    Date:  08/26/2017   ID:  Joseph Kidd, DOB 02/23/1951, MRN [id number]  PCP:  April Manson, NP  Cardiologist:  Dr. Emmeline Winebarger Kidd  No chief complaint on file.   History of Present Illness:  Joseph Kidd is a 67 y.o. male with PMH of HTN, HLD intolerant to statins, OA and  CAD s/p CABG. He presented to the hospital with inferior STEMI on 07/03/2016. Emergent cardiac catheterization performed on 07/03/16 showed 95% proximal to mid RCA stenosis, 30% ostial to proximal RCA stenosis, 95% proximal LAD, 80% mid LAD, 100% occluded proximal left circumflex with left to left and right-to-left collaterals, 90% ostial OM1 lesion, EF 50-55%. Echocardiogram performed on the same day showed EF 60-65%, mild LVH, dilated aortic root up to 40 mm, mild MR. He underwent 5 vessel CABG with LIMA to LAD, SVG to diagonal, sequential SVG to OM1 and OM 3, SVG to RCA by Dr. Tyrone Kidd on 07/04/2013. Post procedure, the hospital course involved volume overload requiring diuresis and also atrial fibrillation with RVR which converted on IV amiodarone. He was also initiated on Crestor.  When seen in follow up July 28, 2016 he complained of persistent wheezing and beta blocker was discontinued. Lisinopril had been discontinued earlier.  He was maintaining NSR and amiodarone was stopped. He was noted to have right leg swelling. LE venous dopplers demonstrated right peroneal vein DVT. He was started on Xarelto and completed his course of therapy in July. In August 2018 he did undergo left TKR. He was given DVT prophylaxis with Xarelto.   On follow up today he is doing very well. He states his left knee surgery went great and he is planning on having his right knee done. He denies any SOB, chest pain, or palpitations. Tolerating medication well.     Past Medical History:  Diagnosis Date  . Coronary artery disease   . DVT (deep venous thrombosis) (HCC)    right lower leg  . Hypercholesterolemia   .  Hypertension   . Myocardial infarction (HCC)   . Osteoarthritis     Past Surgical History:  Procedure Laterality Date  . CARDIAC CATHETERIZATION    . CORONARY ARTERY BYPASS GRAFT N/A 07/04/2016   Procedure: CORONARY ARTERY BYPASS GRAFT times five with left internal mammary artery to Left Anterior Descending artery : endoscopic harvest of right saphenous vein with grafts to Diagonal, Right Coronary, Obtuse Marginal 1, Obtuse Marginal 3 Arteries. Insertion of right groin femoral a line;  Surgeon: Delight Ovens, MD;  Location: [facility] OR;  Service: Open Heart Surgery;  Laterality: N/A;  . INTRAOPERATIVE TRANSESOPHAGEAL ECHOCARDIOGRAM N/A 07/04/2016   Procedure: INTRAOPERATIVE TRANSESOPHAGEAL ECHOCARDIOGRAM;  Surgeon: Delight Ovens, MD;  Location: [facility] OR;  Service: Open Heart Surgery;  Laterality: N/A;  . LEFT HEART CATH AND CORONARY ANGIOGRAPHY N/A 07/03/2016   Procedure: Left Heart Cath and Coronary Angiography;  Surgeon: Joseph Obarr M Swaz[place], MD;  Location: [facility] INVASIVE CV LAB;  Service: Cardiovascular;  Laterality: N/A;  . TOTAL KNEE ARTHROPLASTY Left 11/25/2016   Procedure: LEFT TOTAL KNEE ARTHROPLASTY;  Surgeon: Joseph Batman, MD;  Location: [facility] OR;  Service: Orthopedics;  Laterality: Left;    Current Medications: Outpatient Medications Prior to Visit  Medication Sig Dispense Refill  . aspirin EC 81 MG tablet Take 1 tablet (81 mg total) by mouth daily. 90 tablet 3  . diltiazem (TIAZAC) 120 MG 24 hr capsule TAKE 1 CAPSULE BY MOUTH EVERY DAY 90 capsule 1  .  ezetimibe (ZETIA) 10 MG tablet Take 10 mg by mouth daily.    . rosuvastatin (CRESTOR) 10 MG tablet Take 1 tablet (10 mg total) by mouth daily. MUST GET ADDT'L REFILLS AT UPCOMING APPT 60 tablet 0  . diltiazem (CARDIZEM CD) 120 MG 24 hr capsule Take 1 capsule (120 mg total) by mouth daily. (Patient taking differently: Take 120 mg by mouth daily. ) 90 capsule 3  . ezetimibe (ZETIA) 10 MG tablet Take 1 tablet (10 mg total) daily by mouth. 30 tablet  6  . methocarbamol (ROBAXIN) 500 MG tablet Take 1 tablet (500 mg total) by mouth every 6 (six) hours as needed for muscle spasms. (Patient not taking: Reported on 02/20/2017) 30 tablet 0  . zolpidem (AMBIEN) 10 MG tablet Take 1 tablet by mouth at bedtime.     No facility-administered medications prior to visit.      Allergies:   Atorvastatin and Chlorhexidine   Social History   Socioeconomic History  . Marital status: Married    Spouse name: Not on file  . Number of children: 1  . Years of education: Not on file  . Highest education level: Not on file  Occupational History  . Occupation: retired Recruitment consultant  . Financial resource strain: Not on file  . Food insecurity:    Worry: Not on file    Inability: Not on file  . Transportation needs:    Medical: Not on file    Non-medical: Not on file  Tobacco Use  . Smoking status: Never Smoker  . Smokeless tobacco: Never Used  Substance and Sexual Activity  . Alcohol use: Yes    Comment: very seldom  . Drug use: No  . Sexual activity: Not on file  Lifestyle  . Physical activity:    Days per week: Not on file    Minutes per session: Not on file  . Stress: Not on file  Relationships  . Social connections:    Talks on phone: Not on file    Gets together: Not on file    Attends religious service: Not on file    Active member of club or organization: Not on file    Attends meetings of clubs or organizations: Not on file    Relationship status: Not on file  Other Topics Concern  . Not on file  Social History Narrative  . Not on file     Family History:  The patient's family history includes Heart disease in his mother; Hypercholesterolemia in his mother.   ROS:   Please see the history of present illness.    ROS All other systems reviewed and are negative.   PHYSICAL EXAM:   VS:  BP 134/80 (BP Location: Right Arm, Cuff Size: Normal)   Pulse 67   Ht  (1.803 m)   Wt 219 lb (99.3 kg)   BMI 30.54 kg/m     GENERAL:  Well appearing WM in NAD HEENT:  PERRL, EOMI, sclera are clear. Oropharynx is clear. NECK:  No jugular venous distention, carotid upstroke brisk and symmetric, no bruits, no thyromegaly or adenopathy LUNGS:  Clear to auscultation bilaterally CHEST:  Unremarkable, median sternotomy scar HEART:  RRR,  PMI not displaced or sustained,S1 and S2 within normal limits, no S3, no S4: no clicks, no rubs, no murmurs ABD:  Soft, nontender. BS +, no masses or bruits. No hepatomegaly, no splenomegaly EXT:  2 + pulses throughout, no edema, no cyanosis no clubbing SKIN:  Warm and  dry.  No rashes NEURO:  Alert and oriented x 3. Cranial nerves II through XII intact. PSYCH:  Cognitively intact      Wt Readings from Last 3 Encounters:  08/26/17 219 lb (99.3 kg)  08/25/17 200 lb (90.7 kg)  02/20/17 195 lb (88.5 kg)      Studies/Labs Reviewed:   EKG:  EKG is  ordered today.  NSR with PVCs. Rate 67. T wave inversion in the inferior leads. I have personally reviewed and interpreted this study.   Recent Labs: 11/27/2016: BUN 17; Creatinine, Ser 0.98; Hemoglobin 11.0; Platelets 196; Potassium 4.3; Sodium 139 03/02/2017: ALT 23   Lipid Panel    Component Value Date/Time   CHOL 146 03/02/2017 0908   TRIG 76 03/02/2017 0908   HDL 34 (L) 03/02/2017 0908   CHOLHDL 4.3 03/02/2017 0908   VLDL 19 08/26/2016 1236   LDLCALC 95 03/02/2017 0908   Labs dated 1/291/9: CBC and CMET normal. Cholesterol 120, triglycerides 26, HDL 35, LDL 74. TSH and PSA normal.   Additional studies/ records that were reviewed today include:   Cath 07/03/2016 Conclusion     Prox RCA to Mid RCA lesion, 95 %stenosed.  Ost RCA to Prox RCA lesion, 30 %stenosed.  Mid RCA lesion, 40 %stenosed.  Prox LAD lesion, 95 %stenosed.  Mid LAD-2 lesion, 75 %stenosed.  Mid LAD-1 lesion, 80 %stenosed.  Ost 1st Mrg to 1st Mrg lesion, 90 %stenosed.  Prox Cx to Mid Cx lesion, 100 %stenosed.  The left ventricular systolic  function is normal.  LV end diastolic pressure is normal.  The left ventricular ejection fraction is 50-55% by visual estimate.   1. Critical 3 vessel obstructive CAD.     - complex 95% proximal LAD, 75-80% diffuse mid LAD    - 90% OM1    - 100% mid LCx with left to left and right to left collaterals    - 95% proximal RCA 2. Good LV function 3. Normal LVEDP  Plan: Patient is currently pain free- will aggressively threat medically with ASA, IV Ntg, IV heparin, beta blocker. Consult CT surgery  for CABG.      Echo 07/03/2016 LV EF: 60% -   65%  ------------------------------------------------------------------- Indications:      Chest pain 786.51.  ------------------------------------------------------------------- History:   Risk factors:  NSTEMI. Hypertension. Dyslipidemia.  ------------------------------------------------------------------- Study Conclusions  - Left ventricle: The cavity size was normal. Wall thickness was   increased in a pattern of mild LVH. Systolic function was normal.   The estimated ejection fraction was in the range of 60% to 65%.   Wall motion was normal; there were no regional wall motion   abnormalities. Left ventricular diastolic function parameters   were normal. - Aortic valve: Mildly calcified annulus. There was trivial   regurgitation. - Aorta: Mild aortic root dilatation. Aortic root dimension: 40 mm   (ED). - Mitral valve: There was mild regurgitation.    CABG x 5 by Dr. Tyrone Kidd 07/04/2016 PRE-OPERATIVE DIAGNOSIS:  1. S/p NSTEMI 2. Coronary artery disease  POST-OPERATIVE DIAGNOSIS:  1. S/p NSTEMI 2. Coronary artery disease  PROCEDURE: INTRAOPERATIVE TRANSESOPHAGEAL ECHOCARDIOGRAM, MEDIAN STERNOTOMY for CORONARY ARTERY BYPASS GRAFT x 5 (LIMA to LAD, SVG to DIAGONAL, SVG SEQUENTIALLY to OM1 and OM3, and SVG to RCA) with  endoscopic harvest of right greater saphenous vein with grafts to    ASSESSMENT:    1. Coronary artery  disease involving coronary bypass graft of native heart without angina pectoris   2. S/P CABG  x 5   3. Essential hypertension   4. Hypercholesterolemia      PLAN:  In order of problems listed above:  1. CAD s/p CABG in March 2018: he is asymptomatic. Continue ASA, Statin, Diltiazem.  2. Post op RLE peroneal DVT: resolved.  3. HTN: Blood pressure was initially elevated but on repeat was 134/80. Will monitor for now on diltiazem.  4. HLD: Intolerant to Lipitor as before, he was started on Crestor 5 mg daily, then increased to 10 mg daily. Tolerating well. Last lipids in January looked excellent.  5. Severe osteoarthritis knees. S/p left TKR. Planning to do right TKR. He is cleared from a cardiac standpoint for surgery.    Medication Adjustments/Labs and Tests Ordered: Current medicines are reviewed at length with the patient today.  Concerns regarding medicines are outlined above.  Medication changes, Labs and Tests ordered today are listed in the Patient Instructions below. Patient Instructions  Continue your current therapy  I will see you in 6 months.    Signed, Kimi Bordeau Sw[place], MD  08/26/2017 10:35 AM    [facility] [address] [place], [place], [place]  [address] Phone: [phone]; Fax: [phone]

## 2017-08-24 NOTE — Progress Notes (Signed)
Office Visit Note   Patient: Joseph Kidd           Date of Birth: November 26, 1950           MRN: [id number] Visit Date: 08/24/2017              Requested by: April Manson, NP [address] [place] [address] PCP: April Manson, NP   Assessment & Plan: Visit Diagnoses:  1. History of left knee replacement     Plan: 90-month status post left total knee replacement doing very well range of motion is 0 to 130 degrees.  Joseph Kidd would like to proceed with a right total knee replacement.  We will schedule.  Has end-stage osteoarthritis right knee would like to proceed with a knee replacement.  Follow-Up Instructions: Return will schedule right TKR.   Orders:  No orders of the defined types were placed in this encounter.  No orders of the defined types were placed in this encounter.     Procedures: No procedures performed   Clinical Data: No additional findings.   Subjective: No chief complaint on file. Joseph Kidd is 6 months status post primary left total knee replacement.  He denies any problems or complications.  He walks without a limp and relates that he is very happy with his knee replacement.  HPI  Review of Systems   Objective: Vital Signs: There were no vitals taken for this visit.  Physical Exam  Constitutional: He is oriented to person, place, and time. He appears well-developed and well-nourished.  Eyes: Pupils are equal, round, and reactive to light. EOM are normal.  Pulmonary/Chest: Effort normal.  Neurological: He is alert and oriented to person, place, and time.  Skin: Skin is warm and dry.  Psychiatric: He has a normal mood and affect. His behavior is normal.    Ortho Exam awake alert and oriented x3.  Comfortable sitting.  Walks without a limp.  Full quick extension of left knee with 133 degrees of flexion measured with a goniometer.  No instability.  No effusion.  No localized areas of tenderness.  Neurovascular exam intact.   Straight leg raise negative.  Painless range of motion both hips.  Right knee lacks a few degrees to full extension with a positive effusion.  Flexed over 110 degrees.  No instability.  Predominately medial joint pain.  Specialty Comments:  No specialty comments available.  Imaging: No results found.   PMFS History: Patient Active Problem List   Diagnosis Date Noted  . Primary osteoarthritis of left knee 11/25/2016  . Osteoarthritis of knee 11/25/2016  . S/P total knee replacement using cement, left 11/25/2016  . S/P CABG x 5   . Coronary artery disease 07/04/2016  . Non-ST elevation (NSTEMI) myocardial infarction (HCC) 07/03/2016  . HTN (hypertension) 07/03/2016  . Hypercholesterolemia 07/03/2016  . NSTEMI (non-ST elevated myocardial infarction) (HCC) 07/03/2016  . Chest pain 07/03/2016   Past Medical History:  Diagnosis Date  . Coronary artery disease   . DVT (deep venous thrombosis) (HCC)    right lower leg  . Hypercholesterolemia   . Hypertension   . Myocardial infarction (HCC)   . Osteoarthritis     Family History  Problem Relation Age of Onset  . Heart disease Mother   . Hypercholesterolemia Mother     Past Surgical History:  Procedure Laterality Date  . CARDIAC CATHETERIZATION    . CORONARY ARTERY BYPASS GRAFT N/A 07/04/2016   Procedure: CORONARY  ARTERY BYPASS GRAFT times five with left internal mammary artery to Left Anterior Descending artery : endoscopic harvest of right saphenous vein with grafts to Diagonal, Right Coronary, Obtuse Marginal 1, Obtuse Marginal 3 Arteries. Insertion of right groin femoral a line;  Surgeon: Delight Ovens, MD;  Location: [facility] OR;  Service: Open Heart Surgery;  Laterality: N/A;  . INTRAOPERATIVE TRANSESOPHAGEAL ECHOCARDIOGRAM N/A 07/04/2016   Procedure: INTRAOPERATIVE TRANSESOPHAGEAL ECHOCARDIOGRAM;  Surgeon: Delight Ovens, MD;  Location: [facility] OR;  Service: Open Heart Surgery;  Laterality: N/A;  . LEFT HEART CATH AND CORONARY  ANGIOGRAPHY N/A 07/03/2016   Procedure: Left Heart Cath and Coronary Angiography;  Surgeon: Tremain Rucinski M Swaz[place], MD;  Location: [facility] INVASIVE CV LAB;  Service: Cardiovascular;  Laterality: N/A;  . TOTAL KNEE ARTHROPLASTY Left 11/25/2016   Procedure: LEFT TOTAL KNEE ARTHROPLASTY;  Surgeon: Valeria Batman, MD;  Location: [facility] OR;  Service: Orthopedics;  Laterality: Left;   Social History   Occupational History  . Occupation: retired Copywriter, advertising  Tobacco Use  . Smoking status: Never Smoker  . Smokeless tobacco: Never Used  Substance and Sexual Activity  . Alcohol use: Yes    Comment: very seldom  . Drug use: No  . Sexual activity: Not on file     Valeria Batman, MD   Note - This record has been created using [employer].  Chart creation errors have been sought, but may not always  have been located. Such creation errors do not reflect on  the standard of medical care.

## 2017-08-26 ENCOUNTER — Ambulatory Visit (INDEPENDENT_AMBULATORY_CARE_PROVIDER_SITE_OTHER): Payer: Medicare Other | Admitting: Cardiology

## 2017-08-26 ENCOUNTER — Encounter: Payer: Self-pay | Admitting: Cardiology

## 2017-08-26 VITALS — BP 134/80 | HR 67 | Ht 71.0 in | Wt 219.0 lb

## 2017-08-26 DIAGNOSIS — I10 Essential (primary) hypertension: Secondary | ICD-10-CM | POA: Diagnosis not present

## 2017-08-26 DIAGNOSIS — E7800 Pure hypercholesterolemia, unspecified: Secondary | ICD-10-CM | POA: Diagnosis not present

## 2017-08-26 DIAGNOSIS — Z951 Presence of aortocoronary bypass graft: Secondary | ICD-10-CM | POA: Diagnosis not present

## 2017-08-26 DIAGNOSIS — I25810 Atherosclerosis of coronary artery bypass graft(s) without angina pectoris: Secondary | ICD-10-CM | POA: Diagnosis not present

## 2017-08-26 NOTE — Patient Instructions (Signed)
Continue your current therapy  I will see you in 6 months.   

## 2017-08-28 ENCOUNTER — Telehealth (INDEPENDENT_AMBULATORY_CARE_PROVIDER_SITE_OTHER): Payer: Self-pay | Admitting: Orthopaedic Surgery

## 2017-08-28 NOTE — Telephone Encounter (Signed)
Left message for patient to call and discuss surgery dates. Patient has my direct number.

## 2017-09-30 ENCOUNTER — Encounter (INDEPENDENT_AMBULATORY_CARE_PROVIDER_SITE_OTHER): Payer: Self-pay | Admitting: Surgery

## 2017-09-30 ENCOUNTER — Ambulatory Visit (INDEPENDENT_AMBULATORY_CARE_PROVIDER_SITE_OTHER): Payer: Medicare Other | Admitting: Surgery

## 2017-09-30 VITALS — BP 154/92 | HR 64 | Temp 97.0°F | Ht 71.0 in | Wt 220.0 lb

## 2017-09-30 DIAGNOSIS — I25810 Atherosclerosis of coronary artery bypass graft(s) without angina pectoris: Secondary | ICD-10-CM

## 2017-09-30 DIAGNOSIS — G8929 Other chronic pain: Secondary | ICD-10-CM | POA: Diagnosis not present

## 2017-09-30 DIAGNOSIS — M25561 Pain in right knee: Secondary | ICD-10-CM | POA: Diagnosis not present

## 2017-09-30 NOTE — Pre-Procedure Instructions (Signed)
Joseph Kidd  09/30/2017      [id number] - [place], [place] - [address] [name] [address] Phone: [phone] Fax: [phone]  [facility] [place], [place] - [address] [facility] [place] [address] Phone: [phone] Fax: [phone]    Your procedure is scheduled on Tues., October 13, 2017 from 7:15AM-9:35AM  Report to [facility] Admitting Entrance "A" at 5:30AM  Call this number if you have problems the morning of surgery:  [phone]   Remember:  No food or drinks after midnight on June 17th    Take these medicines the morning of surgery with A SIP OF WATER: Diltiazem [facility])   Follow your doctors instructions regarding your Aspirin.  If no instructions were given by your doctor, then you will need to call the prescribing office office to get instructions.    7 days before surgery (6/11), stop taking all Other Aspirin Products, Vitamins, Fish oils, and Herbal medications. Also stop all NSAIDS i.e. Advil, Ibuprofen, Motrin, Aleve, Anaprox, Naproxen, BC, Goody Powders, and all Supplements.    Do not wear jewelry.  Do not wear lotions, powders, colognes, or deodorant.  Do not shave 48 hours prior to surgery.  Men may shave face.  Do not bring valuables to the hospital.  [facility] is not responsible for any belongings or valuables.  Contacts, dentures or bridgework may not be worn into surgery.  Leave your suitcase in the car.  After surgery it may be brought to your room.  For patients admitted to the hospital, discharge time will be determined by your treatment team.  Patients discharged the day of surgery will not be allowed to drive home.   Special instructions [place]- Preparing For Surgery  Before surgery, you can play an important role. Because skin is not sterile, your skin needs to be as free of germs as possible. You can reduce the number of germs on your  skin by washing with CHG (chlorahexidine gluconate) Soap before surgery.  CHG is an antiseptic cleaner which kills germs and bonds with the skin to continue killing germs even after washing.    Oral Hygiene is also important to reduce your risk of infection.  Remember - BRUSH YOUR TEETH THE MORNING OF SURGERY WITH YOUR REGULAR TOOTHPASTE  Please do not use if you have an allergy to CHG or antibacterial soaps. If your skin becomes reddened/irritated stop using the CHG.  Do not shave (including legs and underarms) for at least 48 hours prior to first CHG shower. It is OK to shave your face.  Please follow these instructions carefully.   1. Shower the NIGHT BEFORE SURGERY and the MORNING OF SURGERY with CHG.   2. If you chose to wash your hair, wash your hair first as usual with your normal shampoo.  3. After you shampoo, rinse your hair and body thoroughly to remove the shampoo.  4. Use CHG as you would any other liquid soap. You can apply CHG directly to the skin and wash gently with a scrungie or a clean washcloth.   5. Apply the CHG Soap to your body ONLY FROM THE NECK DOWN.  Do not use on open wounds or open sores. Avoid contact with your eyes, ears, mouth and genitals (private parts). Wash Face and genitals (private parts)  with your normal soap.  6. Wash thoroughly, paying special attention to the area  where your surgery will be performed.  7. Thoroughly rinse your body with warm water from the neck down.  8. DO NOT shower/wash with your normal soap after using and rinsing off the CHG Soap.  9. Pat yourself dry with a CLEAN TOWEL.  10. Wear CLEAN PAJAMAS to bed the night before surgery, wear comfortable clothes the morning of surgery  11. Place CLEAN SHEETS on your bed the night of your first shower and DO NOT SLEEP WITH PETS.  Day of Surgery:  Do not apply any deodorants/lotions.  Please wear clean clothes to the hospital/surgery center.   Remember to brush your teeth WITH  YOUR REGULAR TOOTHPASTE.  Please read over the following fact sheets that you were given. Pain Booklet, Coughing and Deep Breathing, MRSA Information and Surgical Site Infection Prevention

## 2017-09-30 NOTE — Progress Notes (Signed)
67 year old white male history of end-stage DJD right knee and pain presents for preop evaluation.  Scheduled to undergo right total knee replacement by Dr. Norlene CampbellPeter Whitfield October 13, 2017.  We received preop clearance.  Today full H&P placed in patient's chart.

## 2017-09-30 NOTE — H&P (Signed)
TOTAL KNEE ADMISSION H&P  Patient is being admitted for right total knee arthroplasty.  Subjective:  Chief Complaint:right knee pain.  HPI: Joseph Kidd, 67 y.o. male, has a history of pain and functional disability in the right knee due to arthritis and has failed non-surgical conservative treatments for greater than 12 weeks to includeNSAID's and/or analgesics, use of assistive devices, weight reduction as appropriate and activity modification.  Onset of symptoms was gradual, starting 10 years ago with gradually worsening course since that time.  Patient currently rates pain in the right knee(s) at 10 out of 10 with activity. Patient has night pain, worsening of pain with activity and weight bearing, pain that interferes with activities of daily living, pain with passive range of motion, crepitus and joint swelling.  Patient has evidence of subchondral sclerosis, periarticular osteophytes and joint space narrowing by imaging studies.  There is no active infection.  Patient Active Problem List   Diagnosis Date Noted  . Primary osteoarthritis of left knee 11/25/2016  . Osteoarthritis of knee 11/25/2016  . S/P total knee replacement using cement, left 11/25/2016  . S/P CABG x 5   . Coronary artery disease 07/04/2016  . Non-ST elevation (NSTEMI) myocardial infarction (HCC) 07/03/2016  . HTN (hypertension) 07/03/2016  . Hypercholesterolemia 07/03/2016  . NSTEMI (non-ST elevated myocardial infarction) (HCC) 07/03/2016  . Chest pain 07/03/2016   Past Medical History:  Diagnosis Date  . Coronary artery disease   . DVT (deep venous thrombosis) (HCC)    right lower leg  . Hypercholesterolemia   . Hypertension   . Myocardial infarction (HCC)   . Osteoarthritis     Past Surgical History:  Procedure Laterality Date  . CARDIAC CATHETERIZATION    . CORONARY ARTERY BYPASS GRAFT N/A 07/04/2016   Procedure: CORONARY ARTERY BYPASS GRAFT times five with left internal mammary artery to Left  Anterior Descending artery : endoscopic harvest of right saphenous vein with grafts to Diagonal, Right Coronary, Obtuse Marginal 1, Obtuse Marginal 3 Arteries. Insertion of right groin femoral a line;  Surgeon: Delight Ovens, MD;  Location: [facility] OR;  Service: Open Heart Surgery;  Laterality: N/A;  . INTRAOPERATIVE TRANSESOPHAGEAL ECHOCARDIOGRAM N/A 07/04/2016   Procedure: INTRAOPERATIVE TRANSESOPHAGEAL ECHOCARDIOGRAM;  Surgeon: Delight Ovens, MD;  Location[facility] OR;  Service: Open Heart Surgery;  Laterality: N/A;  . LEFT HEART CATH AND CORONARY ANGIOGRAPHY N/A 07/03/2016   Procedure: Left Heart Cath and Coronary Angiography;  Surgeon: Peter M Swaziland, MD;  Location: [facility] INVASIVE CV LAB;  Service: Cardiovascular;  Laterality: N/A;  . TOTAL KNEE ARTHROPLASTY Left 11/25/2016   Procedure: LEFT TOTAL KNEE ARTHROPLASTY;  Surgeon: Valeria Batman, MD;  Location: [facility] OR;  Service: Orthopedics;  Laterality: Left;    No current facility-administered medications for this encounter.    Current Outpatient Medications  Medication Sig Dispense Refill Last Dose  . aspirin EC 81 MG tablet Take 1 tablet (81 mg total) by mouth daily. 90 tablet 3 Taking  . diltiazem (TIAZAC) 120 MG 24 hr capsule TAKE 1 CAPSULE BY MOUTH EVERY DAY 90 capsule 1 Taking  . ezetimibe (ZETIA) 10 MG tablet Take 10 mg by mouth daily.   Taking  . rosuvastatin (CRESTOR) 10 MG tablet Take 1 tablet (10 mg total) by mouth daily. MUST GET ADDT'L REFILLS AT UPCOMING APPT 60 tablet 0 Taking   Allergies  Allergen Reactions  . Atorvastatin     Severe myalgias  . Chlorhexidine Rash    Social History   Tobacco Use  .  Smoking status: Never Smoker  . Smokeless tobacco: Never Used  Substance Use Topics  . Alcohol use: Yes    Comment: very seldom    Family History  Problem Relation Age of Onset  . Heart disease Mother   . Hypercholesterolemia Mother      Review of Systems  Constitutional: Negative.   HENT: Negative.   Respiratory:  Negative.   Cardiovascular: Negative.   Gastrointestinal: Negative.   Genitourinary: Negative.   Musculoskeletal: Positive for joint pain.  Skin: Negative.   Neurological: Negative.   Psychiatric/Behavioral: Negative.     Objective:  Physical Exam  Constitutional: He is oriented to person, place, and time. He appears well-developed. No distress.  HENT:  Head: Normocephalic and atraumatic.  Eyes: Pupils are equal, round, and reactive to light. EOM are normal.  Neck: Normal range of motion.  Cardiovascular: Normal rate.  Respiratory: Effort normal and breath sounds normal. No respiratory distress.  GI: He exhibits no distension. There is no tenderness.  Musculoskeletal: He exhibits tenderness.  Neurological: He is alert and oriented to person, place, and time.  Skin: Skin is warm and dry.  Psychiatric: He has a normal mood and affect.    Vital signs in last 24 hours: Temp:  [97 F (36.1 C)] 97 F (36.1 C) (06/05 0841) Pulse Rate:  [64] 64 (06/05 0841) BP: (154)/(92) 154/92 (06/05 0841) Weight:  [220 lb (99.8 kg)] 220 lb (99.8 kg) (06/05 0841)  Labs:   Estimated body mass index is 30.68 kg/m as calculated from the following:   Height as of 09/30/17: 5\' 11"  (1.803 m).   Weight as of 09/30/17: 220 lb (99.8 kg).   Imaging Review Plain radiographs demonstrate moderate degenerative joint disease of the right knee(s). The overall alignment ismild varus. The bone quality appears to be good for age and reported activity level.   Preoperative templating of the joint replacement has been completed, documented, and submitted to the Operating Room personnel in order to optimize intra-operative equipment management.    Assessment/Plan:  End stage arthritis, right knee   The patient history, physical examination, clinical judgment of the provider and imaging studies are consistent with end stage degenerative joint disease of the right knee(s) and total knee arthroplasty is deemed  medically necessary. The treatment options including medical management, injection therapy arthroscopy and arthroplasty were discussed at length. The risks and benefits of total knee arthroplasty were presented and reviewed. The risks due to aseptic loosening, infection, stiffness, patella tracking problems, thromboembolic complications and other imponderables were discussed. The patient acknowledged the explanation, agreed to proceed with the plan and consent was signed. Patient is being admitted for inpatient treatment for surgery, pain control, PT, OT, prophylactic antibiotics, VTE prophylaxis, progressive ambulation and ADL's and discharge planning. The patient is planning to be discharged home with home health services

## 2017-10-01 ENCOUNTER — Encounter (HOSPITAL_COMMUNITY): Payer: Self-pay

## 2017-10-01 ENCOUNTER — Other Ambulatory Visit: Payer: Self-pay | Admitting: Cardiology

## 2017-10-01 ENCOUNTER — Ambulatory Visit (HOSPITAL_COMMUNITY)
Admission: RE | Admit: 2017-10-01 | Discharge: 2017-10-01 | Disposition: A | Discharge status: Home / Self Care | Payer: Medicare Other | Source: Ambulatory Visit | Attending: Orthopedic Surgery | Admitting: Orthopedic Surgery

## 2017-10-01 ENCOUNTER — Other Ambulatory Visit: Payer: Self-pay

## 2017-10-01 ENCOUNTER — Encounter (HOSPITAL_COMMUNITY)
Admission: RE | Admit: 2017-10-01 | Discharge: 2017-10-01 | Disposition: A | Discharge status: Home / Self Care | Payer: Medicare Other | Source: Ambulatory Visit | Attending: Orthopaedic Surgery | Admitting: Orthopaedic Surgery

## 2017-10-01 DIAGNOSIS — E785 Hyperlipidemia, unspecified: Secondary | ICD-10-CM | POA: Insufficient documentation

## 2017-10-01 DIAGNOSIS — Z7982 Long term (current) use of aspirin: Secondary | ICD-10-CM | POA: Insufficient documentation

## 2017-10-01 DIAGNOSIS — Z01812 Encounter for preprocedural laboratory examination: Secondary | ICD-10-CM | POA: Insufficient documentation

## 2017-10-01 DIAGNOSIS — Z86718 Personal history of other venous thrombosis and embolism: Secondary | ICD-10-CM | POA: Insufficient documentation

## 2017-10-01 DIAGNOSIS — Z0183 Encounter for blood typing: Secondary | ICD-10-CM | POA: Diagnosis not present

## 2017-10-01 DIAGNOSIS — I252 Old myocardial infarction: Secondary | ICD-10-CM | POA: Diagnosis not present

## 2017-10-01 DIAGNOSIS — Z01818 Encounter for other preprocedural examination: Secondary | ICD-10-CM

## 2017-10-01 DIAGNOSIS — Z951 Presence of aortocoronary bypass graft: Secondary | ICD-10-CM | POA: Diagnosis not present

## 2017-10-01 DIAGNOSIS — I10 Essential (primary) hypertension: Secondary | ICD-10-CM | POA: Insufficient documentation

## 2017-10-01 DIAGNOSIS — M1711 Unilateral primary osteoarthritis, right knee: Secondary | ICD-10-CM | POA: Diagnosis not present

## 2017-10-01 DIAGNOSIS — Z79899 Other long term (current) drug therapy: Secondary | ICD-10-CM | POA: Diagnosis not present

## 2017-10-01 DIAGNOSIS — J449 Chronic obstructive pulmonary disease, unspecified: Secondary | ICD-10-CM | POA: Insufficient documentation

## 2017-10-01 DIAGNOSIS — Z96652 Presence of left artificial knee joint: Secondary | ICD-10-CM | POA: Insufficient documentation

## 2017-10-01 DIAGNOSIS — I2510 Atherosclerotic heart disease of native coronary artery without angina pectoris: Secondary | ICD-10-CM | POA: Insufficient documentation

## 2017-10-01 HISTORY — DX: Unilateral primary osteoarthritis, right knee: M17.11

## 2017-10-01 LAB — CBC WITH DIFFERENTIAL/PLATELET
Abs Immature Granulocytes: 0 10*3/uL (ref 0.0–0.1)
BASOS PCT: 1 %
Basophils Absolute: 0.1 10*3/uL (ref 0.0–0.1)
EOS ABS: 0.1 10*3/uL (ref 0.0–0.7)
Eosinophils Relative: 2 %
HCT: 42.8 % (ref 39.0–52.0)
Hemoglobin: 13.8 g/dL (ref 13.0–17.0)
IMMATURE GRANULOCYTES: 0 %
Lymphocytes Relative: 33 %
Lymphs Abs: 2 10*3/uL (ref 0.7–4.0)
MCH: 28.9 pg (ref 26.0–34.0)
MCHC: 32.2 g/dL (ref 30.0–36.0)
MCV: 89.5 fL (ref 78.0–100.0)
Monocytes Absolute: 0.5 10*3/uL (ref 0.1–1.0)
Monocytes Relative: 8 %
NEUTROS PCT: 56 %
Neutro Abs: 3.4 10*3/uL (ref 1.7–7.7)
PLATELETS: 183 10*3/uL (ref 150–400)
RBC: 4.78 MIL/uL (ref 4.22–5.81)
RDW: 13.8 % (ref 11.5–15.5)
WBC: 6 10*3/uL (ref 4.0–10.5)

## 2017-10-01 LAB — COMPREHENSIVE METABOLIC PANEL
ALBUMIN: 3.8 g/dL (ref 3.5–5.0)
ALT: 44 U/L (ref 17–63)
AST: 31 U/L (ref 15–41)
Alkaline Phosphatase: 76 U/L (ref 38–126)
Anion gap: 6 (ref 5–15)
BUN: 19 mg/dL (ref 6–20)
CHLORIDE: 109 mmol/L (ref 101–111)
CO2: 28 mmol/L (ref 22–32)
Calcium: 9 mg/dL (ref 8.9–10.3)
Creatinine, Ser: 1.02 mg/dL (ref 0.61–1.24)
GFR calc Af Amer: >60 mL/min (ref >60)
Glucose, Bld: 86 mg/dL (ref 65–99)
POTASSIUM: 3.9 mmol/L (ref 3.5–5.1)
Sodium: 143 mmol/L (ref 135–145)
Total Bilirubin: 0.6 mg/dL (ref 0.3–1.2)
Total Protein: 7.3 g/dL (ref 6.5–8.1)

## 2017-10-01 LAB — URINALYSIS, ROUTINE W REFLEX MICROSCOPIC
BILIRUBIN URINE: NEGATIVE
GLUCOSE, UA: NEGATIVE mg/dL
HGB URINE DIPSTICK: NEGATIVE
KETONES UR: NEGATIVE mg/dL
Leukocytes, UA: NEGATIVE
NITRITE: NEGATIVE
PH: 7 (ref 5.0–8.0)
Protein, ur: NEGATIVE mg/dL
Specific Gravity, Urine: 1.009 (ref 1.005–1.030)

## 2017-10-01 LAB — PROTIME-INR
INR: 1.11
PROTHROMBIN TIME: 14.2 s (ref 11.4–15.2)

## 2017-10-01 LAB — APTT: APTT: 30 s (ref 24–36)

## 2017-10-01 LAB — TYPE AND SCREEN
ABO/RH(D): O POS
ANTIBODY SCREEN: NEGATIVE

## 2017-10-01 LAB — SURGICAL PCR SCREEN
MRSA, PCR: NEGATIVE
Staphylococcus aureus: NEGATIVE

## 2017-10-01 NOTE — Progress Notes (Signed)
PCP - NP-Marsha White  Cardiologist - Dr. Peter SwazilandJordan  Chest x-ray - 10/01/17  EKG - 08/26/17 (E)  Stress Test - Denies  ECHO - 07/04/16 (E)  Cardiac Cath - 07/03/16 (E)  Sleep Study - Denies CPAP - None  LABS- 10/01/17: CBC w/D, CMP, PT, PTT, T/S, UA, UC  ASA- LD- 6/14   Anesthesia- Yes- Cardiac history/previous consult on 11/12/16  Pt denies having chest pain, sob, or fever at this time. All instructions explained to the pt, with a verbal understanding of the material. Pt agrees to go over the instructions while at home for a better understanding. The opportunity to ask questions was provided.

## 2017-10-01 NOTE — Telephone Encounter (Signed)
Rx sent to pharmacy   

## 2017-10-02 LAB — URINE CULTURE: Culture: NO GROWTH

## 2017-10-02 NOTE — Progress Notes (Signed)
Anesthesia Chart Review:  Case:  [id number] Date/Time:  10/13/17 0700   Procedure:  RIGHT TOTAL KNEE ARTHROPLASTY (Right ) - FEMORAL NERVE BLOCK   Anesthesia type:  General   Pre-op diagnosis:  RIGHT KNEE OSTEOARTHRITIS   [facility] ROOM 07 / [facility]   Surgeon:  Valeria Batman, MD      DISCUSSION: Patient is a 67 year old male scheduled for the above procedure.  History includes CAD/inferior NSTEMI 07/03/16 (s/p CABG 5: LIMA-LAD, SVG-DIAG, SVG-OM1-OM3, SVG-RCA 07/04/16; complicated by post-op afib), HTN, hyperlipidemia. DVT (07/28/16; completed course of Xarelto 11/02/16), left TKA 11/25/16.  Never smoker.   He is not on a b-blocker currently, as it was discontinued due to wheezing.  Last ASA 10/09/17. He has clearance from his cardiologist.  If no acute changes and I would anticipate that he can proceed as planned.   VS: BP (!) 163/96 Comment: notified Adrain RN  Pulse 77   Temp 36.6 C   Resp 20   Ht 5\' 11"  (1.803 m)   Wt 232 lb 12.8 oz (105.6 kg)   SpO2 100%   BMI 32.47 kg/m  BP 154/92, 163/96.  PROVIDERS: April Manson, NP is PCP [facility] Everywhere) Sw[place], Peter, MD is cardiologist. Last visit 08/26/17. He was aware of upcoming surgery and wrote, "He is cleared from a cardiac standpoint for surgery."   LABS: Labs reviewed: Acceptable for surgery. (all labs ordered are listed, but only abnormal results are displayed)  Labs Reviewed  URINALYSIS, ROUTINE W REFLEX MICROSCOPIC - Abnormal; Notable for the following components:      Result Value   Color, Urine STRAW (*)    All other components within normal limits  URINE CULTURE  SURGICAL PCR SCREEN  APTT  CBC WITH DIFFERENTIAL/PLATELET  COMPREHENSIVE METABOLIC PANEL  PROTIME-INR  TYPE AND SCREEN    IMAGES: CXR 10/01/17: IMPRESSION: COPD without acute abnormality.  EKG: 08/26/17 EKG: Sinus rhythm with occasional PVCs.  T wave abnormality, consider inferior ischemia.   CV: TEE 07/04/16:   Right ventricle: Normal wall  thickness and ejection fraction. Cavity is moderately dilated.  Tricuspid valve: Trace regurgitation. The tricuspid valve regurgitation jet is central.  Pulmonic valve: Mild regurgitation.  Mitral valve: Dilated mitral annulus. No leaflet thickening and calcification present. Moderate regurgitation.  Aortic valve: The valve is trileaflet. No stenosis. Trace regurgitation. No AV vegetation.  Aorta: The ascending aorta is mildly dilated.  Left atrium: No spontaneous echo contrast.  Left ventricle: Normal cavity size and wall thickness. LV systolic function is moderately reduced with an EF of 35-40%. Wall motion is abnormal. Basal, inferior wall motion is mildly hypokinetic. Mid, inferolateral wall motion is hypokinetic. No thrombus present.  Right ventricle: Normal cavity size. RV systolic function hyperdynamic post op  Cardiac cath 07/03/16 (PRE-CABG):  1. Critical 3 vessel obstructive CAD.  - complex 95% proximal LAD, 75-80% diffuse mid LAD - 90% OM1 - 100% mid LCx with left to left and right to left collaterals - 95% proximal RCA 2. Good LV function 3. Normal LVEDP - Plan: Consult CT surgery for CABG. (S/P CABG x5 07/04/16)   Carotid duplex 07/03/16:  - The vertebral arteries appear patent with antegrade flow. - Findings consistent with a 1-39 percent stenosis involving the right internal carotid artery and the left internal carotid artery.   Past Medical History:  Diagnosis Date  . Coronary artery disease   . DVT (deep venous thrombosis) (HCC)    right lower leg  . Hypercholesterolemia   .  Hypertension   . Myocardial infarction (HCC)   . Osteoarthritis   . Osteoarthritis of right knee     Past Surgical History:  Procedure Laterality Date  . CARDIAC CATHETERIZATION    . CORONARY ARTERY BYPASS GRAFT N/A 07/04/2016   Procedure: CORONARY ARTERY BYPASS GRAFT times five with left internal mammary artery to Left Anterior Descending artery : endoscopic harvest of  right saphenous vein with grafts to Diagonal, Right Coronary, Obtuse Marginal 1, Obtuse Marginal 3 Arteries. Insertion of right groin femoral a line;  Surgeon: Delight OvensEdward B Gerhardt, MD;  Location: [facility] OR;  Service: Open Heart Surgery;  Laterality: N/A;  . INTRAOPERATIVE TRANSESOPHAGEAL ECHOCARDIOGRAM N/A 07/04/2016   Procedure: INTRAOPERATIVE TRANSESOPHAGEAL ECHOCARDIOGRAM;  Surgeon: Delight OvensEdward B Gerhardt, MD;  [facility];  Service: Open Heart Surgery;  Laterality: N/A;  . LEFT HEART CATH AND CORONARY ANGIOGRAPHY N/A 07/03/2016   Procedure: Left Heart Cath and Coronary Angiography;  Surgeon: Peter M SwazilandJordan, MD;  [facility];  Service: Cardiovascular;  Laterality: N/A;  . TOTAL KNEE ARTHROPLASTY Left 11/25/2016   Procedure: LEFT TOTAL KNEE ARTHROPLASTY;  Surgeon: Valeria BatmanWhitfield, Peter W, MD;  Location: [facility] OR;  Service: Orthopedics;  Laterality: Left;    MEDICATIONS: . aspirin EC 81 MG tablet  . diltiazem (TIAZAC) 120 MG 24 hr capsule  . ezetimibe (ZETIA) 10 MG tablet  . rosuvastatin (CRESTOR) 10 MG tablet   No current facility-administered medications for this encounter.    Joseph Ochsllison Alice Burnside, PA-C [facility]/Anesthesiology Phone [phone] 10/02/2017 11:01 AM

## 2017-10-12 ENCOUNTER — Other Ambulatory Visit: Payer: Self-pay | Admitting: Orthopaedic Surgery

## 2017-10-12 MED ORDER — TRANEXAMIC ACID 1000 MG/10ML IV SOLN
2000.0000 mg | INTRAVENOUS | Status: AC
  Administered 2017-10-13: 2000 mg via TOPICAL
  Filled 2017-10-12: qty 20

## 2017-10-12 NOTE — Care Plan (Signed)
Met with patient in the office. Prefers to go straight to OPPT, does not want a CPM and would like to have ASA instead of Xarelto. I have discussed this with MD and he is agreeable to all requests.   [place]  10/16/17 @ 100P   MD follow up :  10/26/17 @ 145   Has all equipment at home from previous surgery.    Please contact Ladell Heads, [place] with questions or if this plan should need to change.   Thanks

## 2017-10-13 ENCOUNTER — Other Ambulatory Visit: Payer: Self-pay

## 2017-10-13 ENCOUNTER — Inpatient Hospital Stay (HOSPITAL_COMMUNITY)
Admission: RE | Admit: 2017-10-13 | Discharge: 2017-10-15 | DRG: 470 | Disposition: A | Discharge status: Home Health | Payer: Medicare Other | Attending: Orthopaedic Surgery | Admitting: Orthopaedic Surgery

## 2017-10-13 ENCOUNTER — Inpatient Hospital Stay (HOSPITAL_COMMUNITY): Payer: Medicare Other

## 2017-10-13 ENCOUNTER — Encounter (HOSPITAL_COMMUNITY): Payer: Self-pay

## 2017-10-13 ENCOUNTER — Inpatient Hospital Stay (HOSPITAL_COMMUNITY): Payer: Medicare Other | Admitting: Vascular Surgery

## 2017-10-13 ENCOUNTER — Encounter (HOSPITAL_COMMUNITY)
Admission: RE | Disposition: A | Discharge status: Home Health | Payer: Self-pay | Source: Home / Self Care | Attending: Orthopaedic Surgery

## 2017-10-13 ENCOUNTER — Inpatient Hospital Stay (HOSPITAL_COMMUNITY): Payer: Medicare Other | Admitting: Certified Registered Nurse Anesthetist

## 2017-10-13 DIAGNOSIS — Z79899 Other long term (current) drug therapy: Secondary | ICD-10-CM | POA: Diagnosis not present

## 2017-10-13 DIAGNOSIS — Z86718 Personal history of other venous thrombosis and embolism: Secondary | ICD-10-CM | POA: Diagnosis not present

## 2017-10-13 DIAGNOSIS — M170 Bilateral primary osteoarthritis of knee: Secondary | ICD-10-CM | POA: Diagnosis not present

## 2017-10-13 DIAGNOSIS — I252 Old myocardial infarction: Secondary | ICD-10-CM

## 2017-10-13 DIAGNOSIS — Z8249 Family history of ischemic heart disease and other diseases of the circulatory system: Secondary | ICD-10-CM | POA: Diagnosis not present

## 2017-10-13 DIAGNOSIS — G8918 Other acute postprocedural pain: Secondary | ICD-10-CM | POA: Diagnosis not present

## 2017-10-13 DIAGNOSIS — Z8349 Family history of other endocrine, nutritional and metabolic diseases: Secondary | ICD-10-CM

## 2017-10-13 DIAGNOSIS — Z7982 Long term (current) use of aspirin: Secondary | ICD-10-CM

## 2017-10-13 DIAGNOSIS — E7800 Pure hypercholesterolemia, unspecified: Secondary | ICD-10-CM | POA: Diagnosis present

## 2017-10-13 DIAGNOSIS — Z951 Presence of aortocoronary bypass graft: Secondary | ICD-10-CM

## 2017-10-13 DIAGNOSIS — I10 Essential (primary) hypertension: Secondary | ICD-10-CM | POA: Diagnosis present

## 2017-10-13 DIAGNOSIS — Z96652 Presence of left artificial knee joint: Secondary | ICD-10-CM | POA: Diagnosis present

## 2017-10-13 DIAGNOSIS — Z96659 Presence of unspecified artificial knee joint: Secondary | ICD-10-CM

## 2017-10-13 DIAGNOSIS — I2510 Atherosclerotic heart disease of native coronary artery without angina pectoris: Secondary | ICD-10-CM | POA: Diagnosis not present

## 2017-10-13 DIAGNOSIS — M1711 Unilateral primary osteoarthritis, right knee: Secondary | ICD-10-CM | POA: Diagnosis not present

## 2017-10-13 DIAGNOSIS — M21161 Varus deformity, not elsewhere classified, right knee: Secondary | ICD-10-CM | POA: Diagnosis not present

## 2017-10-13 DIAGNOSIS — M25761 Osteophyte, right knee: Secondary | ICD-10-CM | POA: Diagnosis not present

## 2017-10-13 DIAGNOSIS — Z888 Allergy status to other drugs, medicaments and biological substances status: Secondary | ICD-10-CM

## 2017-10-13 DIAGNOSIS — Z96651 Presence of right artificial knee joint: Secondary | ICD-10-CM | POA: Diagnosis not present

## 2017-10-13 DIAGNOSIS — Z471 Aftercare following joint replacement surgery: Secondary | ICD-10-CM | POA: Diagnosis not present

## 2017-10-13 DIAGNOSIS — M25561 Pain in right knee: Secondary | ICD-10-CM | POA: Diagnosis present

## 2017-10-13 DIAGNOSIS — I214 Non-ST elevation (NSTEMI) myocardial infarction: Secondary | ICD-10-CM | POA: Diagnosis not present

## 2017-10-13 HISTORY — PX: TOTAL KNEE ARTHROPLASTY: SHX125

## 2017-10-13 LAB — CREATININE, SERUM
CREATININE: 0.99 mg/dL (ref 0.61–1.24)
GFR calc Af Amer: >60 mL/min (ref >60)
GFR calc non Af Amer: >60 mL/min (ref >60)

## 2017-10-13 LAB — CBC
HEMATOCRIT: 41.1 % (ref 39.0–52.0)
HEMOGLOBIN: 13.1 g/dL (ref 13.0–17.0)
MCH: 28.7 pg (ref 26.0–34.0)
MCHC: 31.9 g/dL (ref 30.0–36.0)
MCV: 90.1 fL (ref 78.0–100.0)
Platelets: 187 10*3/uL (ref 150–400)
RBC: 4.56 MIL/uL (ref 4.22–5.81)
RDW: 13.7 % (ref 11.5–15.5)
WBC: 7.2 10*3/uL (ref 4.0–10.5)

## 2017-10-13 SURGERY — ARTHROPLASTY, KNEE, TOTAL
Anesthesia: Monitor Anesthesia Care | Site: Knee | Laterality: Right

## 2017-10-13 MED ORDER — BUPIVACAINE IN DEXTROSE 0.75-8.25 % IT SOLN
INTRATHECAL | Status: DC | PRN
  Administered 2017-10-13: 2 mL via INTRATHECAL

## 2017-10-13 MED ORDER — BISACODYL 5 MG PO TBEC: 5.0000 mg | DELAYED_RELEASE_TABLET | Freq: Every day | ORAL | Status: DC | PRN

## 2017-10-13 MED ORDER — MENTHOL 3 MG MT LOZG: 1.0000 | LOZENGE | OROMUCOSAL | Status: DC | PRN

## 2017-10-13 MED ORDER — ONDANSETRON HCL 4 MG/2ML IJ SOLN: 4.0000 mg | Freq: Four times a day (QID) | INTRAMUSCULAR | Status: DC | PRN

## 2017-10-13 MED ORDER — CEFAZOLIN SODIUM-DEXTROSE 2-4 GM/100ML-% IV SOLN
2.0000 g | Freq: Four times a day (QID) | INTRAVENOUS | Status: AC
  Administered 2017-10-13 (×2): 2 g via INTRAVENOUS
  Filled 2017-10-13 (×2): qty 100

## 2017-10-13 MED ORDER — ASPIRIN EC 81 MG PO TBEC: DELAYED_RELEASE_TABLET | ORAL | 3 refills | Status: DC

## 2017-10-13 MED ORDER — PHENOL 1.4 % MT LIQD: 1.0000 | OROMUCOSAL | Status: DC | PRN

## 2017-10-13 MED ORDER — DIPHENHYDRAMINE HCL 12.5 MG/5ML PO ELIX: 12.5000 mg | ORAL_SOLUTION | ORAL | Status: DC | PRN

## 2017-10-13 MED ORDER — OXYCODONE HCL 5 MG PO TABS
5.0000 mg | ORAL_TABLET | ORAL | Status: DC | PRN
  Administered 2017-10-14 – 2017-10-15 (×3): 10 mg via ORAL
  Filled 2017-10-13 (×2): qty 2

## 2017-10-13 MED ORDER — DILTIAZEM HCL ER COATED BEADS 120 MG PO CP24
120.0000 mg | ORAL_CAPSULE | Freq: Every day | ORAL | Status: DC
  Administered 2017-10-14 – 2017-10-15 (×2): 120 mg via ORAL
  Filled 2017-10-13 (×6): qty 1

## 2017-10-13 MED ORDER — EPHEDRINE SULFATE 50 MG/ML IJ SOLN
INTRAMUSCULAR | Status: DC | PRN
  Administered 2017-10-13: 5 mg via INTRAVENOUS
  Administered 2017-10-13: 10 mg via INTRAVENOUS

## 2017-10-13 MED ORDER — LACTATED RINGERS IV SOLN
INTRAVENOUS | Status: DC | PRN
  Administered 2017-10-13: 07:00:00 via INTRAVENOUS

## 2017-10-13 MED ORDER — METHOCARBAMOL 1000 MG/10ML IJ SOLN
500.0000 mg | Freq: Four times a day (QID) | INTRAVENOUS | Status: DC | PRN
  Filled 2017-10-13: qty 5

## 2017-10-13 MED ORDER — FENTANYL CITRATE (PF) 250 MCG/5ML IJ SOLN
INTRAMUSCULAR | Status: AC
  Filled 2017-10-13: qty 5

## 2017-10-13 MED ORDER — PROPOFOL 10 MG/ML IV BOLUS
INTRAVENOUS | Status: AC
  Filled 2017-10-13: qty 20

## 2017-10-13 MED ORDER — POVIDONE-IODINE 7.5 % EX SOLN
Freq: Once | CUTANEOUS | Status: DC
  Filled 2017-10-13: qty 118

## 2017-10-13 MED ORDER — DEXAMETHASONE SODIUM PHOSPHATE 10 MG/ML IJ SOLN
INTRAMUSCULAR | Status: DC | PRN
  Administered 2017-10-13: 10 mg via INTRAVENOUS

## 2017-10-13 MED ORDER — DOCUSATE SODIUM 100 MG PO CAPS
100.0000 mg | ORAL_CAPSULE | Freq: Two times a day (BID) | ORAL | Status: DC
  Administered 2017-10-13 – 2017-10-15 (×5): 100 mg via ORAL
  Filled 2017-10-13 (×5): qty 1

## 2017-10-13 MED ORDER — ACETAMINOPHEN 325 MG PO TABS
325.0000 mg | ORAL_TABLET | Freq: Four times a day (QID) | ORAL | Status: DC | PRN
  Filled 2017-10-13: qty 2

## 2017-10-13 MED ORDER — FENTANYL CITRATE (PF) 100 MCG/2ML IJ SOLN: 25.0000 ug | INTRAMUSCULAR | Status: DC | PRN

## 2017-10-13 MED ORDER — ROSUVASTATIN CALCIUM 10 MG PO TABS
10.0000 mg | ORAL_TABLET | Freq: Every day | ORAL | Status: DC
  Administered 2017-10-13 – 2017-10-15 (×3): 10 mg via ORAL
  Filled 2017-10-13 (×3): qty 1

## 2017-10-13 MED ORDER — LIDOCAINE 2% (20 MG/ML) 5 ML SYRINGE
INTRAMUSCULAR | Status: AC
  Filled 2017-10-13: qty 5

## 2017-10-13 MED ORDER — HYDROMORPHONE HCL 2 MG/ML IJ SOLN
0.5000 mg | INTRAMUSCULAR | Status: DC | PRN
  Administered 2017-10-13 – 2017-10-14 (×2): 1 mg via INTRAVENOUS
  Filled 2017-10-13 (×2): qty 1

## 2017-10-13 MED ORDER — METOCLOPRAMIDE HCL 5 MG PO TABS: 5.0000 mg | ORAL_TABLET | Freq: Three times a day (TID) | ORAL | Status: DC | PRN

## 2017-10-13 MED ORDER — PROPOFOL 10 MG/ML IV BOLUS
INTRAVENOUS | Status: DC | PRN
  Administered 2017-10-13: 30 mg via INTRAVENOUS

## 2017-10-13 MED ORDER — MIDAZOLAM HCL 5 MG/5ML IJ SOLN
INTRAMUSCULAR | Status: DC | PRN
  Administered 2017-10-13: 1 mg via INTRAVENOUS

## 2017-10-13 MED ORDER — ZOLPIDEM TARTRATE 5 MG PO TABS: 5.0000 mg | ORAL_TABLET | Freq: Every evening | ORAL | Status: DC | PRN

## 2017-10-13 MED ORDER — POLYETHYLENE GLYCOL 3350 17 G PO PACK: 17.0000 g | PACK | Freq: Every day | ORAL | Status: DC | PRN

## 2017-10-13 MED ORDER — ONDANSETRON HCL 4 MG/2ML IJ SOLN
INTRAMUSCULAR | Status: DC | PRN
  Administered 2017-10-13: 4 mg via INTRAVENOUS

## 2017-10-13 MED ORDER — ONDANSETRON HCL 4 MG/2ML IJ SOLN
INTRAMUSCULAR | Status: AC
  Filled 2017-10-13: qty 2

## 2017-10-13 MED ORDER — OXYCODONE-ACETAMINOPHEN 5-325 MG PO TABS: ORAL_TABLET | ORAL | 0 refills | Status: DC

## 2017-10-13 MED ORDER — BUPIVACAINE-EPINEPHRINE (PF) 0.25% -1:200000 IJ SOLN
INTRAMUSCULAR | Status: AC
  Filled 2017-10-13: qty 30

## 2017-10-13 MED ORDER — ONDANSETRON HCL 4 MG PO TABS: 4.0000 mg | ORAL_TABLET | Freq: Four times a day (QID) | ORAL | Status: DC | PRN

## 2017-10-13 MED ORDER — BUPIVACAINE-EPINEPHRINE 0.25% -1:200000 IJ SOLN
INTRAMUSCULAR | Status: DC | PRN
  Administered 2017-10-13: 30 mL

## 2017-10-13 MED ORDER — EZETIMIBE 10 MG PO TABS
10.0000 mg | ORAL_TABLET | Freq: Every day | ORAL | Status: DC
  Administered 2017-10-13 – 2017-10-15 (×3): 10 mg via ORAL
  Filled 2017-10-13 (×3): qty 1

## 2017-10-13 MED ORDER — MIDAZOLAM HCL 2 MG/2ML IJ SOLN
INTRAMUSCULAR | Status: AC
  Filled 2017-10-13: qty 2

## 2017-10-13 MED ORDER — METOCLOPRAMIDE HCL 5 MG/ML IJ SOLN: 5.0000 mg | Freq: Three times a day (TID) | INTRAMUSCULAR | Status: DC | PRN

## 2017-10-13 MED ORDER — ROPIVACAINE HCL 7.5 MG/ML IJ SOLN
INTRAMUSCULAR | Status: DC | PRN
  Administered 2017-10-13: 20 mL via PERINEURAL

## 2017-10-13 MED ORDER — 0.9 % SODIUM CHLORIDE (POUR BTL) OPTIME
TOPICAL | Status: DC | PRN
  Administered 2017-10-13: 1000 mL

## 2017-10-13 MED ORDER — ACETAMINOPHEN 500 MG PO TABS
1000.0000 mg | ORAL_TABLET | Freq: Four times a day (QID) | ORAL | Status: AC
  Administered 2017-10-13 – 2017-10-14 (×2): 1000 mg via ORAL
  Filled 2017-10-13 (×2): qty 2

## 2017-10-13 MED ORDER — CEFAZOLIN SODIUM-DEXTROSE 2-4 GM/100ML-% IV SOLN
2.0000 g | INTRAVENOUS | Status: AC
  Administered 2017-10-13: 2 g via INTRAVENOUS
  Filled 2017-10-13: qty 100

## 2017-10-13 MED ORDER — LIDOCAINE 2% (20 MG/ML) 5 ML SYRINGE
INTRAMUSCULAR | Status: DC | PRN
  Administered 2017-10-13: 40 mg via INTRAVENOUS
  Administered 2017-10-13: 20 mg via INTRAVENOUS

## 2017-10-13 MED ORDER — MAGNESIUM CITRATE PO SOLN: 1.0000 | Freq: Once | ORAL | Status: DC | PRN

## 2017-10-13 MED ORDER — SODIUM CHLORIDE 0.9 % IR SOLN
Status: DC | PRN
  Administered 2017-10-13: 3000 mL

## 2017-10-13 MED ORDER — METHOCARBAMOL 500 MG PO TABS
500.0000 mg | ORAL_TABLET | Freq: Four times a day (QID) | ORAL | Status: DC | PRN
  Administered 2017-10-13 – 2017-10-15 (×4): 500 mg via ORAL
  Filled 2017-10-13 (×4): qty 1

## 2017-10-13 MED ORDER — FENTANYL CITRATE (PF) 100 MCG/2ML IJ SOLN
INTRAMUSCULAR | Status: DC | PRN
  Administered 2017-10-13: 50 ug via INTRAVENOUS

## 2017-10-13 MED ORDER — DEXAMETHASONE SODIUM PHOSPHATE 10 MG/ML IJ SOLN
INTRAMUSCULAR | Status: AC
  Filled 2017-10-13: qty 1

## 2017-10-13 MED ORDER — PANTOPRAZOLE SODIUM 40 MG PO TBEC
40.0000 mg | DELAYED_RELEASE_TABLET | Freq: Every day | ORAL | Status: DC
  Administered 2017-10-13 – 2017-10-15 (×3): 40 mg via ORAL
  Filled 2017-10-13 (×3): qty 1

## 2017-10-13 MED ORDER — POTASSIUM CHLORIDE IN NACL 20-0.9 MEQ/L-% IV SOLN
INTRAVENOUS | Status: DC
  Administered 2017-10-13: 12:00:00 via INTRAVENOUS
  Filled 2017-10-13: qty 1000

## 2017-10-13 MED ORDER — OXYCODONE HCL 5 MG PO TABS
10.0000 mg | ORAL_TABLET | ORAL | Status: DC | PRN
  Administered 2017-10-13 (×2): 15 mg via ORAL
  Filled 2017-10-13 (×2): qty 3
  Filled 2017-10-13: qty 2

## 2017-10-13 MED ORDER — SODIUM CHLORIDE 0.9 % IV SOLN: INTRAVENOUS | Status: DC

## 2017-10-13 MED ORDER — EPHEDRINE SULFATE 50 MG/ML IJ SOLN
INTRAMUSCULAR | Status: AC
  Filled 2017-10-13: qty 1

## 2017-10-13 MED ORDER — PROPOFOL 500 MG/50ML IV EMUL
INTRAVENOUS | Status: DC | PRN
  Administered 2017-10-13: 100 ug/kg/min via INTRAVENOUS

## 2017-10-13 MED ORDER — OXYCODONE HCL 5 MG/5ML PO SOLN: 5.0000 mg | Freq: Once | ORAL | Status: DC | PRN

## 2017-10-13 MED ORDER — OXYCODONE HCL 5 MG PO TABS: 5.0000 mg | ORAL_TABLET | Freq: Once | ORAL | Status: DC | PRN

## 2017-10-13 MED ORDER — PHENYLEPHRINE HCL 10 MG/ML IJ SOLN
INTRAMUSCULAR | Status: DC | PRN
  Administered 2017-10-13: 50 ug/min via INTRAVENOUS

## 2017-10-13 MED ORDER — ALUM & MAG HYDROXIDE-SIMETH 200-200-20 MG/5ML PO SUSP: 30.0000 mL | ORAL | Status: DC | PRN

## 2017-10-13 MED ORDER — ACETAMINOPHEN 10 MG/ML IV SOLN
1000.0000 mg | Freq: Four times a day (QID) | INTRAVENOUS | Status: DC
  Administered 2017-10-13: 1000 mg via INTRAVENOUS
  Filled 2017-10-13: qty 100

## 2017-10-13 MED ORDER — ENOXAPARIN SODIUM 30 MG/0.3ML ~~LOC~~ SOLN
30.0000 mg | Freq: Two times a day (BID) | SUBCUTANEOUS | Status: DC
  Administered 2017-10-14 – 2017-10-15 (×3): 30 mg via SUBCUTANEOUS
  Filled 2017-10-13 (×3): qty 0.3

## 2017-10-13 SURGICAL SUPPLY — 64 items
BAG DECANTER FOR FLEXI CONT (MISCELLANEOUS) ×2 IMPLANT
BANDAGE ACE 4X5 VEL STRL LF (GAUZE/BANDAGES/DRESSINGS) ×1 IMPLANT
BANDAGE ACE 6X5 VEL STRL LF (GAUZE/BANDAGES/DRESSINGS) ×1 IMPLANT
BANDAGE ESMARK 6X9 LF (GAUZE/BANDAGES/DRESSINGS) ×1 IMPLANT
BLADE SAGITTAL 25.0X1.19X90 (BLADE) ×2 IMPLANT
BNDG ESMARK 6X9 LF (GAUZE/BANDAGES/DRESSINGS) ×2
BOWL SMART MIX CTS (DISPOSABLE) ×2 IMPLANT
CAP KNEE TOTAL 3 SIGMA ×1 IMPLANT
CEMENT HV SMART SET (Cement) ×4 IMPLANT
COVER SURGICAL LIGHT HANDLE (MISCELLANEOUS) ×2 IMPLANT
CUFF TOURNIQUET SINGLE 34IN LL (TOURNIQUET CUFF) ×2 IMPLANT
CUFF TOURNIQUET SINGLE 44IN (TOURNIQUET CUFF) IMPLANT
DECANTER SPIKE VIAL GLASS SM (MISCELLANEOUS) ×2 IMPLANT
DRAPE EXTREMITY T 121X128X90 (DRAPE) ×2 IMPLANT
DRAPE HALF SHEET 40X57 (DRAPES) ×4 IMPLANT
DRSG ADAPTIC 3X8 NADH LF (GAUZE/BANDAGES/DRESSINGS) ×2 IMPLANT
DRSG PAD ABDOMINAL 8X10 ST (GAUZE/BANDAGES/DRESSINGS) ×4 IMPLANT
DURAPREP 26ML APPLICATOR (WOUND CARE) ×4 IMPLANT
ELECT CAUTERY BLADE 6.4 (BLADE) ×3 IMPLANT
ELECT REM PT RETURN 9FT ADLT (ELECTROSURGICAL) ×2
ELECTRODE REM PT RTRN 9FT ADLT (ELECTROSURGICAL) ×1 IMPLANT
EVACUATOR 1/8 PVC DRAIN (DRAIN) IMPLANT
FACESHIELD WRAPAROUND (MASK) ×4 IMPLANT
FACESHIELD WRAPAROUND OR TEAM (MASK) ×2 IMPLANT
GAUZE SPONGE 4X4 12PLY STRL (GAUZE/BANDAGES/DRESSINGS) ×2 IMPLANT
GLOVE BIOGEL PI IND STRL 8 (GLOVE) ×1 IMPLANT
GLOVE BIOGEL PI IND STRL 8.5 (GLOVE) ×1 IMPLANT
GLOVE BIOGEL PI INDICATOR 8 (GLOVE) ×1
GLOVE BIOGEL PI INDICATOR 8.5 (GLOVE) ×1
GLOVE ECLIPSE 8.0 STRL XLNG CF (GLOVE) ×4 IMPLANT
GLOVE ECLIPSE 8.5 STRL (GLOVE) ×4 IMPLANT
GOWN STRL REUS W/ TWL LRG LVL3 (GOWN DISPOSABLE) ×2 IMPLANT
GOWN STRL REUS W/TWL 2XL LVL3 (GOWN DISPOSABLE) ×2 IMPLANT
GOWN STRL REUS W/TWL LRG LVL3 (GOWN DISPOSABLE) ×2
HANDPIECE INTERPULSE COAX TIP (DISPOSABLE) ×1
KIT BASIN OR (CUSTOM PROCEDURE TRAY) ×2 IMPLANT
KIT TURNOVER KIT B (KITS) ×2 IMPLANT
MANIFOLD NEPTUNE II (INSTRUMENTS) ×2 IMPLANT
NEEDLE 22X1 1/2 (OR ONLY) (NEEDLE) ×2 IMPLANT
NS IRRIG 1000ML POUR BTL (IV SOLUTION) ×2 IMPLANT
PACK TOTAL JOINT (CUSTOM PROCEDURE TRAY) ×2 IMPLANT
PAD ARMBOARD 7.5X6 YLW CONV (MISCELLANEOUS) ×4 IMPLANT
PAD CAST 4YDX4 CTTN HI CHSV (CAST SUPPLIES) ×1 IMPLANT
PADDING CAST COTTON 4X4 STRL (CAST SUPPLIES) ×2
PADDING CAST COTTON 6X4 STRL (CAST SUPPLIES) ×2 IMPLANT
PENCIL BUTTON HOLSTER BLD 10FT (ELECTRODE) ×1 IMPLANT
SET HNDPC FAN SPRY TIP SCT (DISPOSABLE) ×1 IMPLANT
STAPLER VISISTAT 35W (STAPLE) ×3 IMPLANT
SUCTION FRAZIER HANDLE 10FR (MISCELLANEOUS) ×1
SUCTION TUBE FRAZIER 10FR DISP (MISCELLANEOUS) ×1 IMPLANT
SURGIFLO W/THROMBIN 8M KIT (HEMOSTASIS) IMPLANT
SUT BONE WAX W31G (SUTURE) ×2 IMPLANT
SUT ETHIBOND NAB CT1 #1 30IN (SUTURE) ×4 IMPLANT
SUT MNCRL AB 3-0 PS2 18 (SUTURE) ×2 IMPLANT
SUT VIC AB 0 CT1 27 (SUTURE) ×1
SUT VIC AB 0 CT1 27XBRD ANBCTR (SUTURE) ×1 IMPLANT
SUT VIC AB 2-0 CT1 27 (SUTURE) ×4
SUT VIC AB 2-0 CT1 TAPERPNT 27 (SUTURE) IMPLANT
SYR CONTROL 10ML LL (SYRINGE) IMPLANT
TOWEL OR 17X24 6PK STRL BLUE (TOWEL DISPOSABLE) ×2 IMPLANT
TOWEL OR 17X26 10 PK STRL BLUE (TOWEL DISPOSABLE) ×2 IMPLANT
TRAY FOLEY BAG SILVER LF 16FR (SET/KITS/TRAYS/PACK) ×1 IMPLANT
TRAY FOLEY W/BAG SLVR 14FR (SET/KITS/TRAYS/PACK) ×1 IMPLANT
WRAP KNEE MAXI GEL POST OP (GAUZE/BANDAGES/DRESSINGS) ×2 IMPLANT

## 2017-10-13 NOTE — H&P (Signed)
The recent History & Physical has been reviewed. I have personally examined the patient today. There is no interval change to the documented History & Physical. The patient would like to proceed with the procedure.  Valeria Batmaneter W Aziza Stuckert 10/13/2017,  7:11 AM

## 2017-10-13 NOTE — Progress Notes (Signed)
PATIENT ID:      Joseph Kidd  MRN:     [id number] DOB/AGE:    08/27/1950 / 67 y.o.       OPERATIVE REPORT    DATE OF PROCEDURE:  10/13/2017       PREOPERATIVE DIAGNOSIS:END STAGE   RIGHT KNEE OSTEOARTHRITIS WITH VARUS DEFORMITY                                                       Estimated body mass index is 32.47 kg/m as calculated from the following:   Height as of this encounter: 5\' 11"  (1.803 m).   Weight as of this encounter: 232 lb 12.8 oz (105.6 kg).     POSTOPERATIVE DIAGNOSIS:   RIGHT KNEE OSTEOARTHRITIS -SAME                                                                    Estimated body mass index is 32.47 kg/m as calculated from the following:   Height as of this encounter: 5\' 11"  (1.803 m).   Weight as of this encounter: 232 lb 12.8 oz (105.6 kg).     PROCEDURE:  Procedure(s): RIGHT TOTAL KNEE ARTHROPLASTY     SURGEON:  Norlene CampbellPeter Whitfield, MD    ASSISTANT:   Esperanza RichtersLINDSEY Stanbery,PAC  (Present and scrubbed throughout the case, critical for assistance with exposure, retraction, instrumentation, and closure.)          ANESTHESIA: regional, spinal and IV sedation     DRAINS: none :      TOURNIQUET TIME: 80 minutes   COMPLICATIONS:  None   CONDITION:  stable  PROCEDURE IN DETAIL: [id number]   [name] 10/13/2017, 9:24 AM  Patient ID: Joseph Kidd, male   DOB: 01/09/1951, 67 y.o.   MRN: [id number]

## 2017-10-13 NOTE — Anesthesia Preprocedure Evaluation (Signed)
Anesthesia Evaluation  Patient identified by MRN, date of birth, ID band Patient awake    Reviewed: Allergy & Precautions, H&P , NPO status , Patient's Chart, lab work & pertinent test results  Airway Mallampati: II   Neck ROM: full    Dental   Pulmonary neg pulmonary ROS,    breath sounds clear to auscultation       Cardiovascular hypertension, + CAD, + Past MI and + CABG   Rhythm:regular Rate:Normal     Neuro/Psych    GI/Hepatic   Endo/Other    Renal/GU      Musculoskeletal  (+) Arthritis ,   Abdominal   Peds  Hematology   Anesthesia Other Findings   Reproductive/Obstetrics                             Anesthesia Physical Anesthesia Plan  ASA: II  Anesthesia Plan: MAC and Spinal   Post-op Pain Management:  Regional for Post-op pain   Induction: Intravenous  PONV Risk Score and Plan: 1 and Ondansetron, Propofol infusion and Dexamethasone  Airway Management Planned: Simple Face Mask  Additional Equipment:   Intra-op Plan:   Post-operative Plan:   Informed Consent: I have reviewed the patients History and Physical, chart, labs and discussed the procedure including the risks, benefits and alternatives for the proposed anesthesia with the patient or authorized representative who has indicated his/her understanding and acceptance.     Plan Discussed with: CRNA, Surgeon and Anesthesiologist  Anesthesia Plan Comments:         Anesthesia Quick Evaluation

## 2017-10-13 NOTE — Progress Notes (Signed)
Orthopedic Tech Progress Note Patient Details:  Joseph Kidd 05/31/1950 [id number]  CPM Right Knee CPM Right Knee: On Right Knee Flexion (Degrees): 90 Right Knee Extension (Degrees): 0 Additional Comments: trapeze bar patient helper Viewed order from doctor's order list Post Interventions Patient Tolerated: Well Instructions Provided: Care of device  Nikki DomCrawford, Joseph Kidd 10/13/2017, 11:22 AM

## 2017-10-13 NOTE — Transfer of Care (Signed)
Immediate Anesthesia Transfer of Care Note  Patient: Joseph Kidd  Procedure(s) Performed: RIGHT TOTAL KNEE ARTHROPLASTY (Right Knee)  Patient Location: PACU  Anesthesia Type:MAC, Regional and Spinal  Level of Consciousness: awake, alert , oriented and patient cooperative  Airway & Oxygen Therapy: Patient Spontanous Breathing and Patient connected to nasal cannula oxygen  Post-op Assessment: Report given to RN and Post -op Vital signs reviewed and stable  Post vital signs: Reviewed and stable  Last Vitals:  Vitals Value Taken Time  BP 110/64 10/13/2017  9:53 AM  Temp    Pulse 63 10/13/2017  9:54 AM  Resp 21 10/13/2017  9:54 AM  SpO2 94 % 10/13/2017  9:54 AM  Vitals shown include unvalidated device data.  Last Pain:  Vitals:   10/13/17 0607  TempSrc: Oral  PainSc: 0-No pain      Patients Stated Pain Goal: 3 (40/76/80 8811)  Complications: No apparent anesthesia complications

## 2017-10-13 NOTE — Anesthesia Procedure Notes (Signed)
Spinal  Patient location during procedure: OR Start time: 10/13/2017 7:20 AM End time: 10/13/2017 7:22 AM Staffing Anesthesiologist: Achille RichHodierne, Quill Grinder, MD Performed: anesthesiologist  Preanesthetic Checklist Completed: patient identified, surgical consent, pre-op evaluation, timeout performed, IV checked, risks and benefits discussed and monitors and equipment checked Spinal Block Patient position: sitting Prep: DuraPrep Patient monitoring: cardiac monitor, continuous pulse ox and blood pressure Approach: midline Location: L3-4 Injection technique: single-shot Needle Needle type: Pencan  Needle gauge: 24 G Needle length: 9 cm Assessment Sensory level: T10 Additional Notes Functioning IV was confirmed and monitors were applied. Sterile prep and drape, including hand hygiene and sterile gloves were used. The patient was positioned and the spine was prepped. The skin was anesthetized with lidocaine.  Free flow of clear CSF was obtained prior to injecting local anesthetic into the CSF.  The spinal needle aspirated freely following injection.  The needle was carefully withdrawn.  The patient tolerated the procedure well.

## 2017-10-13 NOTE — Op Note (Signed)
NAME: CLOYDE, OREGEL MEDICAL RECORD JA:[id number] ACCOUNT [id number] DATE OF BIRTH:1951-04-27 FACILITY: [facility] LOCATION: [place], [place]  OPERATIVE REPORT  DATE OF PROCEDURE:  10/13/2017  PREOPERATIVE DIAGNOSIS:  End-stage osteoarthritis, right knee with varus deformity.  POSTOPERATIVE DIAGNOSIS:  End-stage osteoarthritis, right knee with varus deformity.  PROCEDURE:  Right total knee replacement.  SURGEON:  Joni Fears, MD  ASSISTANT:  Ria Comment, PA-C.  ANESTHESIA:  Spinal with adductor canal block and IV sedation.  SPECIMENS:  None.  INDICATIONS:  DePuy LCS large femoral component, a #5 rotating keeled tibial tray, a 10 mm polyethylene bridging bearing, a metal backed 3 peg rotating patella.  Components were secured with polymethyl methacrylate.  PROCEDURE:  The patient was met in the holding area and identified the right knee as the appropriate operative site and marked it accordingly.  Anesthesia performed an adductor canal block.  Any questions were answered.  The patient was then transported to room 7.  Spinal anesthetic was administered by Anesthesia under IV sedation.  DESCRIPTION OF PROCEDURE:  The patient was then placed comfortably on the operating table.  Nursing staff inserted a Foley catheter.  Urine was clear.  The right lower extremity was then placed in a thigh tourniquet.  The leg was then prepped with Betadine scrub and then DuraPrep x2 from the tourniquet to the tips of the toes.  Sterile draping was performed.  Timeout was called.  The right lower extremity was then elevated and Esmarch exsanguinated with a proximal tourniquet at 325 mmHg.  The midline longitudinal incision was then made centered about the patella extending from the superior pouch to the tibial tubercle.  By sharp dissection, the incision was carried down to the subcutaneous tissue.  First layer of capsule was incised in  the midline.  A medial parapatellar  incision was then made with the Bovie.  The joint was entered.  There was a clear yellow joint effusion.  The patella was everted 180 degrees laterally, the knee flexed to 90 degrees.  There was abundant beefy red  synovitis.  There was a fixed varus deformity with complete absence of articular cartilage in the medial femoral condyle and medial tibial plateau with large osteophytes medially.  I performed a subperiosteal medial release on the tibia, at which point I  could place the knee in neutral.  A synovectomy was performed.  I measured a large femoral component.  First bony cut was then made transversely in the proximal tibia with a 7-degree angle of declination.  After each bony cut on the tibia and the femur, I checked my alignment with the external guides.  Subsequent cuts were then made on the femur with a large femoral jig.  I used a 4-degree distal femoral valgus.  Laminar spreader was then placed into the medial and lateral compartment.  I removed medial and lateral menisci as well as osteophytes in the  posterior femoral condyle bilaterally.  There was a large 1 cm loose body medially which was removed.  Flexion, extension gaps appeared to be symmetrical at 10 mm.  MCL and LCL remained intact throughout the procedure.  Final tapering cuts were then made on the femur as well as the center hole.  Retractors were then placed about the tibia.  It was advanced anteriorly.  I measured a #5 tibial tray and this was pinned in place.  I checked the alignment with the external guide.  Central hole was then made followed by the keeled cut.  With the trial  tibial jig in place, a 10 mm polyethylene bridging bearing was inserted.  This was followed by the trial femoral component.  The entire construct was reduced and through a full range of motion had perfect stability in full extension and  flexion without malrotation of the tibial tray.  Patella was prepared by removing approximately 10 mm of  bone leaving 12.5 mm of patella thickness.  Three holes were then made.  The patella trial applied and through a full range of motion after reduction remained perfectly stable.  Trial components were then removed.  The joint was copiously irrigated with saline solution.  With the knee flexed to 90 degrees, retractors were placed about the tibia.  I reirrigated the joint.  The #5 tibial component was then impacted with polymethyl methacrylate.  Any extraneous methacrylate was removed from the periphery of the component.  The 10 mm polyethylene bridging bearing was applied followed by the large femoral component with methacrylate.  The entire construct was reduced.  Any further extraneous methacrylate was removed from the periphery of the components.  The patella was applied with methacrylate and a patellar clamp.  At approximately 16 minutes, the methacrylate had matured during which time we injected the joint with 0.25% Marcaine with epinephrine.  Tourniquet was released at 80 minutes.  We had  excellent capillary refill to the joint.  Hemostasis was provided with a topical tranexamic acid and Bovie coagulation.  We had a nice dry field.  The deep capsule was then closed with a running #1 Ethibond.  Superficial capsule closed with Vicryl,  subcutaneous with Monocryl and then skin clips.  Sterile bulky dressing was applied followed by the patient's support stocking.  The patient tolerated the procedure without complications.  TN/NUANCE  D:10/13/2017 T:10/13/2017 JOB:[id number]

## 2017-10-13 NOTE — Discharge Instructions (Signed)

## 2017-10-13 NOTE — Evaluation (Signed)
Physical Therapy Evaluation Patient Details Name: Joseph Kidd MRN: [id number] DOB: 03/03/1951 Today's Date: 10/13/2017   History of Present Illness  Pt is a 67 y/o male s/p elective R TKA. PMH includes NSTEMI, HTN, L TKA, s/p L TKA.   Clinical Impression  Pt is s/p surgery above with deficits below. Pt with R knee instability, so gait limited to within the room. Required min to min guard A for steadying throughout gait with RW. Reviewed knee precautions and supine HEP. Will continue to follow acutely to maximize functional mobility independence and safety.     Follow Up Recommendations Follow surgeon's recommendation for DC plan and follow-up therapies;Supervision for mobility/OOB    Equipment Recommendations  None recommended by PT    Recommendations for Other Services       Precautions / Restrictions Precautions Precautions: Knee Precaution Booklet Issued: Yes (comment) Precaution Comments: Reviewed knee precautions and HEP.  Restrictions Weight Bearing Restrictions: Yes RLE Weight Bearing: Weight bearing as tolerated      Mobility  Bed Mobility Overal bed mobility: Needs Assistance Bed Mobility: Supine to Sit     Supine to sit: Supervision     General bed mobility comments: Supervision for safety. increased time required.   Transfers Overall transfer level: Needs assistance Equipment used: Rolling walker (2 wheeled) Transfers: Sit to/from Stand Sit to Stand: Min assist         General transfer comment: Min A for lift assist and steadying. Verbal cues for safe hand placement.   Ambulation/Gait Ambulation/Gait assistance: Min assist;Min guard Gait Distance (Feet): 20 Feet Assistive device: Rolling walker (2 wheeled) Gait Pattern/deviations: Step-to pattern;Decreased step length - right;Decreased step length - left;Decreased weight shift to right;Antalgic Gait velocity: Decreased    General Gait Details: Slow, antalgic gait. Verbal cues for sequencing with  RW. Noted knee instability during ambulation, therefore distance limited to within the room.   Stairs            Wheelchair Mobility    Modified Rankin (Stroke Patients Only)       Balance Overall balance assessment: Needs assistance Sitting-balance support: No upper extremity supported;Feet supported Sitting balance-Leahy Scale: Good     Standing balance support: Bilateral upper extremity supported;During functional activity Standing balance-Leahy Scale: Poor Standing balance comment: Reliant on BUE support.                              Pertinent Vitals/Pain Pain Assessment: 0-10 Pain Score: 2  Pain Location: R knee  Pain Descriptors / Indicators: Aching;Operative site guarding Pain Intervention(s): Limited activity within patient's tolerance;Monitored during session;Repositioned    Home Living Family/patient expects to be discharged to:: Private residence Living Arrangements: Spouse/significant other Available Help at Discharge: Family;Available 24 hours/day Type of Home: House Home Access: Level entry     Home Layout: Two level(lives in the basement ) Home Equipment: Walker - 2 wheels;Tub bench;Grab bars - tub/shower      Prior Function Level of Independence: Independent               Hand Dominance   Dominant Hand: Left    Extremity/Trunk Assessment   Upper Extremity Assessment Upper Extremity Assessment: Overall WFL for tasks assessed    Lower Extremity Assessment Lower Extremity Assessment: RLE deficits/detail RLE Deficits / Details: Sensation in tact. Deficits consistent with post op pain and weakness. Able to perform ther ex below.     Cervical / Trunk Assessment Cervical / Trunk  Assessment: Normal  Communication   Communication: No difficulties  Cognition Arousal/Alertness: Awake/alert Behavior During Therapy: WFL for tasks assessed/performed Overall Cognitive Status: Within Functional Limits for tasks assessed                                         General Comments      Exercises Total Joint Exercises Ankle Circles/Pumps: AROM;Both;20 reps Quad Sets: AROM;Right;10 reps Heel Slides: AROM;Right;10 reps   Assessment/Plan    PT Assessment Patient needs continued PT services  PT Problem List Decreased strength;Decreased range of motion;Decreased balance;Decreased mobility;Decreased coordination;Decreased knowledge of use of DME       PT Treatment Interventions DME instruction;Gait training;Stair training;Functional mobility training;Therapeutic activities;Therapeutic exercise;Balance training;Patient/family education    PT Goals (Current goals can be found in the Care Plan section)  Acute Rehab PT Goals Patient Stated Goal: to go home  PT Goal Formulation: With patient Time For Goal Achievement: 10/27/17 Potential to Achieve Goals: Good    Frequency 7X/week   Barriers to discharge        Co-evaluation               AM-PAC PT "6 Clicks" Daily Activity  Outcome Measure Difficulty turning over in bed (including adjusting bedclothes, sheets and blankets)?: A Little Difficulty moving from lying on back to sitting on the side of the bed? : A Little Difficulty sitting down on and standing up from a chair with arms (e.g., wheelchair, bedside commode, etc,.)?: Unable Help needed moving to and from a bed to chair (including a wheelchair)?: A Little Help needed walking in hospital room?: A Little Help needed climbing 3-5 steps with a railing? : A Little 6 Click Score: 16    End of Session Equipment Utilized During Treatment: Gait belt Activity Tolerance: Patient tolerated treatment well Patient left: in chair;with call bell/phone within reach;with family/visitor present Nurse Communication: Mobility status PT Visit Diagnosis: Unsteadiness on feet (R26.81);Other abnormalities of gait and mobility (R26.89);Pain Pain - Right/Left: Right Pain - part of body: Knee    Time:  1610-9604 PT Time Calculation (min) (ACUTE ONLY): 23 min   Charges:   PT Evaluation $PT Eval Low Complexity: 1 Low PT Treatments $Therapeutic Activity: 8-22 mins   PT G Codes:        Gladys Damme, PT, DPT  Acute Rehabilitation Services  Pager: [phone]   Lehman Prom 10/13/2017, 3:02 PM

## 2017-10-13 NOTE — Anesthesia Procedure Notes (Signed)
Anesthesia Regional Block: Adductor canal block   Pre-Anesthetic Checklist: ,, timeout performed, Correct Patient, Correct Site, Correct Laterality, Correct Procedure, Correct Position, site marked, Risks and benefits discussed,  Surgical consent,  Pre-op evaluation,  At surgeon's request and post-op pain management  Laterality: Right  Prep: chloraprep       Needles:  Injection technique: Single-shot  Needle Type: Echogenic Needle     Needle Length: 9cm  Needle Gauge: 21     Additional Needles:   Narrative:  Start time: 10/13/2017 7:04 AM End time: 10/13/2017 7:07 AM Injection made incrementally with aspirations every 5 mL.  Performed by: Personally  Anesthesiologist: Achille RichHodierne, Zhyon Antenucci, MD  Additional Notes: Pt tolerated the procedure well.

## 2017-10-14 ENCOUNTER — Encounter (HOSPITAL_COMMUNITY): Payer: Self-pay | Admitting: Orthopaedic Surgery

## 2017-10-14 LAB — BASIC METABOLIC PANEL
ANION GAP: 4 — AB (ref 5–15)
BUN: 19 mg/dL (ref 6–20)
CALCIUM: 8.6 mg/dL — AB (ref 8.9–10.3)
CO2: 26 mmol/L (ref 22–32)
Chloride: 108 mmol/L (ref 101–111)
Creatinine, Ser: 1.06 mg/dL (ref 0.61–1.24)
GFR calc non Af Amer: >60 mL/min (ref >60)
Glucose, Bld: 154 mg/dL — ABNORMAL HIGH (ref 65–99)
POTASSIUM: 4.3 mmol/L (ref 3.5–5.1)
Sodium: 138 mmol/L (ref 135–145)

## 2017-10-14 LAB — CBC
HEMATOCRIT: 35.3 % — AB (ref 39.0–52.0)
HEMOGLOBIN: 11.4 g/dL — AB (ref 13.0–17.0)
MCH: 29.3 pg (ref 26.0–34.0)
MCHC: 32.3 g/dL (ref 30.0–36.0)
MCV: 90.7 fL (ref 78.0–100.0)
Platelets: 193 10*3/uL (ref 150–400)
RBC: 3.89 MIL/uL — AB (ref 4.22–5.81)
RDW: 13.9 % (ref 11.5–15.5)
WBC: 11 10*3/uL — AB (ref 4.0–10.5)

## 2017-10-14 NOTE — Progress Notes (Signed)
Physical Therapy Treatment Patient Details Name: Joseph Kidd M Porzio MRN: [id number] DOB: 03/21/1951 Today's Date: 10/14/2017    History of Present Illness Pt is a 67 y/o male s/p elective R TKA. PMH includes NSTEMI, HTN, L TKA, s/p L TKA.     PT Comments    Pt making steady progress with functional mobility and tolerated ambulating an increased distance this session. Pt would continue to benefit from skilled physical therapy services at this time while admitted and after d/c to address the below listed limitations in order to improve overall safety and independence with functional mobility.   Follow Up Recommendations  Home health PT     Equipment Recommendations  None recommended by PT    Recommendations for Other Services       Precautions / Restrictions Precautions Precautions: Knee Precaution Booklet Issued: Yes (comment) Precaution Comments: Reviewed positioning of LE following TKA sx Restrictions Weight Bearing Restrictions: Yes RLE Weight Bearing: Weight bearing as tolerated Other Position/Activity Restrictions: MD orders are for "WBAT R LE"; however, ortho note from 10/14/17 states "PWB 50%". Discussed with pt and pt stated that his MD did not address WB'ing status with him this AM    Mobility  Bed Mobility Overal bed mobility: Needs Assistance Bed Mobility: Supine to Sit     Supine to sit: Min assist     General bed mobility comments: min A with R LE movement  Transfers Overall transfer level: Needs assistance Equipment used: Rolling walker (2 wheeled) Transfers: Sit to/from Stand Sit to Stand: Supervision         General transfer comment: supervision for safety  Ambulation/Gait Ambulation/Gait assistance: Min guard Gait Distance (Feet): 300 Feet Assistive device: Rolling walker (2 wheeled) Gait Pattern/deviations: Step-to pattern;Decreased step length - right;Decreased step length - left;Decreased weight shift to right;Antalgic Gait velocity: Decreased   Gait velocity interpretation: 1.31 - 2.62 ft/sec, indicative of limited community ambulator General Gait Details: pt with modest antalgic gait, cueing for safety with RW   Stairs             Wheelchair Mobility    Modified Rankin (Stroke Patients Only)       Balance Overall balance assessment: Needs assistance Sitting-balance support: No upper extremity supported;Feet supported Sitting balance-Leahy Scale: Good     Standing balance support: Bilateral upper extremity supported;During functional activity Standing balance-Leahy Scale: Poor                              Cognition Arousal/Alertness: Awake/alert Behavior During Therapy: WFL for tasks assessed/performed Overall Cognitive Status: Within Functional Limits for tasks assessed                                        Exercises      General Comments        Pertinent Vitals/Pain Pain Assessment: Faces Faces Pain Scale: Hurts a little bit Pain Location: R knee  Pain Descriptors / Indicators: Aching;Operative site guarding Pain Intervention(s): Monitored during session;Repositioned    Home Living                      Prior Function            PT Goals (current goals can now be found in the care plan section) Acute Rehab PT Goals PT Goal Formulation: With patient Time For Goal  Achievement: 10/27/17 Potential to Achieve Goals: Good Progress towards PT goals: Progressing toward goals    Frequency    7X/week      PT Plan Current plan remains appropriate    Co-evaluation              AM-PAC PT "6 Clicks" Daily Activity  Outcome Measure  Difficulty turning over in bed (including adjusting bedclothes, sheets and blankets)?: None Difficulty moving from lying on back to sitting on the side of the bed? : Unable Difficulty sitting down on and standing up from a chair with arms (e.g., wheelchair, bedside commode, etc,.)?: Unable Help needed moving to and  from a bed to chair (including a wheelchair)?: A Little Help needed walking in hospital room?: A Little Help needed climbing 3-5 steps with a railing? : A Little 6 Click Score: 15    End of Session   Activity Tolerance: Patient tolerated treatment well Patient left: in chair;with call bell/phone within reach;with family/visitor present Nurse Communication: Mobility status PT Visit Diagnosis: Other abnormalities of gait and mobility (R26.89);Pain Pain - Right/Left: Right Pain - part of body: Knee     Time: 1610-9604 PT Time Calculation (min) (ACUTE ONLY): 14 min  Charges:  $Gait Training: 8-22 mins                    G Codes:       [place], [place], [place] [phone]    [name] Jaquez Farrington 10/14/2017, 3:44 PM

## 2017-10-14 NOTE — Progress Notes (Signed)
PATIENT ID: Joseph Kidd        MRN:  [id number]          DOB/AGE: 1951-03-11 / 67 y.o.    Norlene Campbell, MD   Jacqualine Code, PA-C [address] [place], [place]  [address]                             [phone]   PROGRESS NOTE  Subjective:  negative for Chest Pain  negative for Shortness of Breath  negative for Nausea/Vomiting   negative for Calf Pain    Tolerating Diet: yes         Patient reports pain as mild.     Comfortable with minimal use of pain meds  Objective: Vital signs in last 24 hours:    Patient Vitals for the past 24 hrs:  BP Temp Temp src Pulse Resp SpO2  10/14/17 0400 136/84 97.7 F (36.5 C) Oral 68 15 96 %  10/14/17 0010 111/74 97.8 F (36.6 C) Oral 70 13 96 %  10/13/17 2028 131/87 98.4 F (36.9 C) Oral 76 18 97 %  10/13/17 1146 122/84 97.7 F (36.5 C) Oral (!) 59 - 95 %  10/13/17 1130 - 97.7 F (36.5 C) - - - -  10/13/17 1123 118/83 - - (!) 58 18 93 %  10/13/17 1108 113/77 - - (!) 59 17 94 %  10/13/17 1053 121/77 - - (!) 59 17 95 %  10/13/17 1038 119/68 - - 61 15 96 %  10/13/17 1023 113/74 - - (!) 59 12 94 %  10/13/17 1008 97/66 - - 63 18 97 %  10/13/17 0953 110/64 (!) 97.5 F (36.4 C) - 62 18 93 %      Intake/Output from previous day:   06/18 0701 - 06/19 0700 In: 1412.3 [P.O.:716; I.V.:696.3] Out: 1750 [Urine:1700]   Intake/Output this shift:   No intake/output data recorded.   Intake/Output      06/18 0701 - 06/19 0700 06/19 0701 - 06/20 0700   P.O. 716    I.V. (mL/kg) 696.3 (6.6)    IV Piggyback 0    Total Intake(mL/kg) 1412.3 (13.4)    Urine (mL/kg/hr) 1700 (0.7)    Blood 50    Total Output 1750    Net -337.7            LABORATORY DATA: Recent Labs    10/13/17 1218 10/14/17 0453  WBC 7.2 11.0*  HGB 13.1 11.4*  HCT 41.1 35.3*  PLT 187 193   Recent Labs    10/13/17 1218 10/14/17 0453  NA  --  138  K  --  4.3  CL  --  108  CO2  --  26  BUN  --  19  CREATININE 0.99 1.06  GLUCOSE  --  154*  CALCIUM  --   8.6*   Lab Results  Component Value Date   INR 1.11 10/01/2017   INR 1.06 11/12/2016   INR 1.77 07/04/2016    Recent Radiographic Studies :  Dg Chest 2 View  Result Date: 10/01/2017 CLINICAL DATA:  Preoperative evaluation for upcoming knee surgery EXAM: CHEST - 2 VIEW COMPARISON:  08/11/2016 FINDINGS: Cardiac shadow is stable. Postsurgical changes are again noted. The lungs are hyper aerated bilaterally without focal infiltrate or sizable effusion. No bony abnormality is seen. IMPRESSION: COPD without acute abnormality. Electronically Signed   By: Alcide Clever M.D.   On: 10/01/2017 15:28  Dg Knee Right Port  Result Date: 10/13/2017 CLINICAL DATA:  Status post right knee joint replacement due to osteoarthritis. EXAM: PORTABLE RIGHT KNEE - 1-2 VIEW COMPARISON:  None in PACs FINDINGS: The patient has undergone total right knee joint prosthesis placement. Radiographic positioning of the prosthetic components is good. The interface with the native bone appears normal. Small amounts of fluid and air are noted in the anterior aspect of the soft tissues. Surgical skin staples are present. IMPRESSION: No immediate complication following total right knee joint prosthesis placement. Electronically Signed   By: David  SwazilandJordan M.D.   On: 10/13/2017 11:24     Examination:  General appearance: alert, cooperative and no distress  Wound Exam: clean, dry, intact   Drainage:  None: wound tissue dry  Motor Exam: EHL, FHL, Anterior Tibial and Posterior Tibial Intact  Sensory Exam: Superficial Peroneal, Deep Peroneal and Tibial normal  Vascular Exam: Normal  Assessment:    1 Day Post-Op  Procedure(s) (LRB): RIGHT TOTAL KNEE ARTHROPLASTY (Right)  ADDITIONAL DIAGNOSIS:  Active Problems:   Primary localized osteoarthritis of right knee     Plan: Physical Therapy as ordered Partial Weight Bearing @ 50% (PWB)  DVT Prophylaxis:  Aspirin, Lovenox, Foot Pumps and TED hose  DISCHARGE PLAN:  Home  DISCHARGE NEEDS: HHPT, CPM, Walker and 3-in-1 comode seat-should have equipment at home, OOB with PT, foley out, change dressing in am and consider discharge        Mat CarneBrian Petrarca, PA-C [facility]  10/14/2017 7:39 AM  Patient ID: Joseph Kidd, male   DOB: 01/23/1951, 67 y.o.   MRN: [id number]

## 2017-10-14 NOTE — Plan of Care (Signed)
  Problem: Nutrition: Goal: Adequate nutrition will be maintained Outcome: Progressing   Problem: Elimination: Goal: Will not experience complications related to bowel motility Outcome: Progressing   Problem: Pain Managment: Goal: General experience of comfort will improve Outcome: Progressing   Problem: Safety: Goal: Ability to remain free from injury will improve Outcome: Progressing   

## 2017-10-14 NOTE — Progress Notes (Signed)
Physical Therapy Treatment Patient Details Name: Joseph Kidd MRN: [id number] DOB: 07/02/1950 Today's Date: 10/14/2017    History of Present Illness Pt is a 67 y/o male s/p elective R TKA. PMH includes NSTEMI, HTN, L TKA, s/p L TKA.     PT Comments    Pt making great progress with functional mobility and successfully completed stair training this session. Pt would continue to benefit from skilled physical therapy services at this time while admitted and after d/c to address the below listed limitations in order to improve overall safety and independence with functional mobility.    Follow Up Recommendations  Home health PT     Equipment Recommendations  None recommended by PT    Recommendations for Other Services       Precautions / Restrictions Precautions Precautions: Knee Precaution Booklet Issued: Yes (comment) Precaution Comments: Reviewed knee precautions and HEP.  Restrictions Weight Bearing Restrictions: Yes RLE Weight Bearing: Weight bearing as tolerated Other Position/Activity Restrictions: MD orders are for "WBAT R LE"; however, ortho note from 10/14/17 states "PWB 50%". Discussed with pt and pt stated that his MD did not address WB'ing status with him this AM    Mobility  Bed Mobility   General bed mobility comments: pt OOB in recliner chair upon arrival  Transfers Overall transfer level: Needs assistance Equipment used: Rolling walker (2 wheeled) Transfers: Sit to/from Stand Sit to Stand: Supervision         General transfer comment: supervision for safety  Ambulation/Gait Ambulation/Gait assistance: Min guard Gait Distance (Feet): 200 Feet Assistive device: Rolling walker (2 wheeled) Gait Pattern/deviations: Step-to pattern;Decreased step length - right;Decreased step length - left;Decreased weight shift to right;Antalgic Gait velocity: Decreased  Gait velocity interpretation: 1.31 - 2.62 ft/sec, indicative of limited community ambulator General  Gait Details: pt with modest antalgic gait, steady with RW, improved gait speed   Stairs Stairs: Yes Stairs assistance: Min guard Stair Management: Two rails;Step to pattern;Forwards Number of Stairs: 2 General stair comments: cueing for technique, min guard for safety   Wheelchair Mobility    Modified Rankin (Stroke Patients Only)       Balance Overall balance assessment: Needs assistance Sitting-balance support: No upper extremity supported;Feet supported Sitting balance-Leahy Scale: Good     Standing balance support: Bilateral upper extremity supported;During functional activity Standing balance-Leahy Scale: Poor                              Cognition Arousal/Alertness: Awake/alert Behavior During Therapy: WFL for tasks assessed/performed Overall Cognitive Status: Within Functional Limits for tasks assessed                                        Exercises Total Joint Exercises Ankle Circles/Pumps: AROM;Both;20 reps Quad Sets: AROM;Right;10 reps Heel Slides: Right;10 reps;AAROM;AROM Goniometric ROM: Flexion = 70 degrees, Extension = lacking 15 degrees to neutral, measured in sitting    General Comments        Pertinent Vitals/Pain Pain Assessment: 0-10 Pain Score: 2  Pain Location: R knee  Pain Descriptors / Indicators: Aching;Operative site guarding Pain Intervention(s): Monitored during session;Repositioned    Home Living                      Prior Function            PT Goals (current  goals can now be found in the care plan section) Acute Rehab PT Goals PT Goal Formulation: With patient Time For Goal Achievement: 10/27/17 Potential to Achieve Goals: Good Progress towards PT goals: Progressing toward goals    Frequency    7X/week      PT Plan Current plan remains appropriate    Co-evaluation              AM-PAC PT "6 Clicks" Daily Activity  Outcome Measure  Difficulty turning over in bed  (including adjusting bedclothes, sheets and blankets)?: A Little Difficulty moving from lying on back to sitting on the side of the bed? : A Little Difficulty sitting down on and standing up from a chair with arms (e.g., wheelchair, bedside commode, etc,.)?: A Little Help needed moving to and from a bed to chair (including a wheelchair)?: A Little Help needed walking in hospital room?: A Little Help needed climbing 3-5 steps with a railing? : A Little 6 Click Score: 18    End of Session Equipment Utilized During Treatment: Gait belt Activity Tolerance: Patient tolerated treatment well Patient left: in chair;with call bell/phone within reach Nurse Communication: Mobility status PT Visit Diagnosis: Other abnormalities of gait and mobility (R26.89);Pain Pain - Right/Left: Right Pain - part of body: Knee     Time: 1610-9604 PT Time Calculation (min) (ACUTE ONLY): 17 min  Charges:  $Gait Training: 8-22 mins                    G Codes:       [place], [place], [place] [phone]    [name] [name] 10/14/2017, 10:59 AM

## 2017-10-14 NOTE — Anesthesia Postprocedure Evaluation (Signed)
Anesthesia Post Note  Patient: Blanche Eastlan M Achorn  Procedure(s) Performed: RIGHT TOTAL KNEE ARTHROPLASTY (Right Knee)     Patient location during evaluation: PACU Anesthesia Type: Regional and Spinal Level of consciousness: oriented and awake and alert Pain management: pain level controlled Vital Signs Assessment: post-procedure vital signs reviewed and stable Respiratory status: spontaneous breathing, respiratory function stable and patient connected to nasal cannula oxygen Cardiovascular status: blood pressure returned to baseline and stable Postop Assessment: no headache, no backache and no apparent nausea or vomiting Anesthetic complications: no    Last Vitals:  Vitals:   10/14/17 0010 10/14/17 0400  BP: 111/74 136/84  Pulse: 70 68  Resp: 13 15  Temp: 36.6 C 36.5 C  SpO2: 96% 96%    Last Pain:  Vitals:   10/14/17 0948  TempSrc:   PainSc: 0-No pain                 Rorie Delmore S

## 2017-10-15 LAB — CBC
HEMATOCRIT: 34.5 % — AB (ref 39.0–52.0)
HEMOGLOBIN: 11.2 g/dL — AB (ref 13.0–17.0)
MCH: 29.6 pg (ref 26.0–34.0)
MCHC: 32.5 g/dL (ref 30.0–36.0)
MCV: 91 fL (ref 78.0–100.0)
Platelets: 177 10*3/uL (ref 150–400)
RBC: 3.79 MIL/uL — ABNORMAL LOW (ref 4.22–5.81)
RDW: 14.1 % (ref 11.5–15.5)
WBC: 8.5 10*3/uL (ref 4.0–10.5)

## 2017-10-15 LAB — BASIC METABOLIC PANEL
Anion gap: 7 (ref 5–15)
BUN: 18 mg/dL (ref 6–20)
CHLORIDE: 103 mmol/L (ref 101–111)
CO2: 29 mmol/L (ref 22–32)
CREATININE: 0.97 mg/dL (ref 0.61–1.24)
Calcium: 8.6 mg/dL — ABNORMAL LOW (ref 8.9–10.3)
GFR calc Af Amer: >60 mL/min (ref >60)
GFR calc non Af Amer: >60 mL/min (ref >60)
Glucose, Bld: 108 mg/dL — ABNORMAL HIGH (ref 65–99)
Potassium: 4.4 mmol/L (ref 3.5–5.1)
Sodium: 139 mmol/L (ref 135–145)

## 2017-10-15 NOTE — Plan of Care (Signed)
  Problem: Education: Goal: Knowledge of General Education information will improve Outcome: Progressing   Problem: Clinical Measurements: Goal: Ability to maintain clinical measurements within normal limits will improve Outcome: Progressing   Problem: Clinical Measurements: Goal: Will remain free from infection Outcome: Progressing   Problem: Activity: Goal: Risk for activity intolerance will decrease Outcome: Progressing   Problem: Pain Managment: Goal: General experience of comfort will improve Outcome: Progressing   

## 2017-10-15 NOTE — Progress Notes (Signed)
Physical Therapy Treatment Patient Details Name: Joseph Kidd MRN: [id number] DOB: 06/12/1950 Today's Date: 10/15/2017    History of Present Illness Pt is a 67 y/o male s/p elective R TKA. PMH includes NSTEMI, HTN, L TKA, s/p L TKA.     PT Comments    Focus of session was on LE therex and HEP as pt has already ambulated in hallway and completed stair training with PT. Plan is for pt to d/c home today with spouse. Pt is ready to d/c home from PT perspective.  Pt would continue to benefit from skilled physical therapy services at this time while admitted and after d/c to address the below listed limitations in order to improve overall safety and independence with functional mobility.    Follow Up Recommendations  Outpatient PT     Equipment Recommendations  None recommended by PT    Recommendations for Other Services       Precautions / Restrictions Precautions Precautions: Knee Precaution Booklet Issued: Yes (comment) Precaution Comments: Reviewed positioning of LE following TKA sx Restrictions Weight Bearing Restrictions: Yes RLE Weight Bearing: Weight bearing as tolerated Other Position/Activity Restrictions: MD orders are for "WBAT R LE"; however, ortho note from 10/14/17 states "PWB 50%". Discussed with pt and pt stated that his MD did not address WB'ing status with him this AM    Mobility  Bed Mobility Overal bed mobility: Needs Assistance Bed Mobility: Supine to Sit;Sit to Supine     Supine to sit: Min assist Sit to supine: Min guard   General bed mobility comments: min A with R LE movement off of bed, pt able to return R LE onto bed with increased time and effort  Transfers                 General transfer comment: focus of session was on therex/HEP  Ambulation/Gait                 Stairs             Wheelchair Mobility    Modified Rankin (Stroke Patients Only)       Balance Overall balance assessment: Needs  assistance Sitting-balance support: No upper extremity supported;Feet supported Sitting balance-Leahy Scale: Good                                      Cognition Arousal/Alertness: Awake/alert Behavior During Therapy: WFL for tasks assessed/performed Overall Cognitive Status: Within Functional Limits for tasks assessed                                        Exercises Total Joint Exercises Quad Sets: AROM;Right;10 reps Gluteal Sets: AROM;Strengthening;Both;10 reps;Supine Short Arc Quad: AROM;Strengthening;Right;10 reps;Supine Heel Slides: AROM;AAROM;Right;10 reps;Supine Hip ABduction/ADduction: AAROM;Right;10 reps;Supine Long Arc Quad: AROM;Strengthening;Right;10 reps;Seated Knee Flexion: AROM;Strengthening;Right;10 reps;Seated    General Comments        Pertinent Vitals/Pain Pain Assessment: 0-10 Pain Score: 2  Pain Location: R knee  Pain Descriptors / Indicators: Aching;Operative site guarding Pain Intervention(s): Monitored during session;Repositioned    Home Living                      Prior Function            PT Goals (current goals can now be found in the care plan  section) Acute Rehab PT Goals PT Goal Formulation: With patient Time For Goal Achievement: 10/27/17 Potential to Achieve Goals: Good Progress towards PT goals: Progressing toward goals    Frequency    7X/week      PT Plan Discharge plan needs to be updated    Co-evaluation              AM-PAC PT "6 Clicks" Daily Activity  Outcome Measure  Difficulty turning over in bed (including adjusting bedclothes, sheets and blankets)?: None Difficulty moving from lying on back to sitting on the side of the bed? : Unable Difficulty sitting down on and standing up from a chair with arms (e.g., wheelchair, bedside commode, etc,.)?: Unable Help needed moving to and from a bed to chair (including a wheelchair)?: None Help needed walking in hospital room?: A  Little Help needed climbing 3-5 steps with a railing? : A Little 6 Click Score: 16    End of Session   Activity Tolerance: Patient tolerated treatment well Patient left: in bed;with call bell/phone within reach Nurse Communication: Mobility status PT Visit Diagnosis: Other abnormalities of gait and mobility (R26.89);Pain Pain - Right/Left: Right Pain - part of body: Knee     Time: 1191-4782 PT Time Calculation (min) (ACUTE ONLY): 16 min  Charges:  $Therapeutic Exercise: 8-22 mins                    G Codes:       Joseph Kidd, [place], [place] [phone]    [name] Maxen Rowland 10/15/2017, 9:47 AM

## 2017-10-15 NOTE — Care Plan (Signed)
Met with patient at bedside. Doing very well. Ready to discharge to home as planned. He will start OPPT tomorrow at [facility]. Patient and MD agreeable to this plan.   Thanks

## 2017-10-15 NOTE — Progress Notes (Signed)
Blanche EastAlan M Szwed to be D/C'd Home per MD order.  Discussed prescriptions and follow up appointments with the patient. Prescriptions given to patient, medication list explained in detail. Pt verbalized understanding.  Allergies as of 10/15/2017      Reactions   Atorvastatin    Severe myalgias   Chlorhexidine Rash      Medication List    TAKE these medications   aspirin EC 81 MG tablet Take one tab bid x 4 weeks and then back to normal schedule.  This is to prevent blood clots What changed:    how much to take  how to take this  when to take this  additional instructions   diltiazem 120 MG 24 hr capsule Commonly known as:  TIAZAC TAKE 1 CAPSULE BY MOUTH EVERY DAY   ezetimibe 10 MG tablet Commonly known as:  ZETIA TAKE ONE TABLET BY MOUTH EVERY DAY   oxyCODONE-acetaminophen 5-325 MG tablet Commonly known as:  PERCOCET Take 1-2 tabs po q4-6 hours prn pain   rosuvastatin 10 MG tablet Commonly known as:  CRESTOR Take 1 tablet (10 mg total) by mouth daily. MUST GET ADDT'L REFILLS AT UPCOMING APPT       Vitals:   10/14/17 2145 10/15/17 0424  BP: (!) 141/87 133/83  Pulse: 78 75  Resp: 16 16  Temp: 99.1 F (37.3 C) 98.5 F (36.9 C)  SpO2: 99% 96%    Skin clean, dry and intact without evidence of skin break down, no evidence of skin tears noted. Aquacel dressing on right knee, clean, dry, and intact. TED hose on BLE upon discharge, pt educated about TED application and care. IV catheter discontinued intact. Site without signs and symptoms of complications. Dressing and pressure applied. Pt denies pain at this time. No complaints noted.  An After Visit Summary and prescriptions were printed and given to the patient. Patient escorted via WC, and D/C home via private auto.  [place]renadaBrittany Marycarmen Hagey RN

## 2017-10-15 NOTE — Discharge Summary (Signed)
Patient ID: Joseph Kidd MRN: [id number] DOB/AGE: 08-09-1950 67 y.o.  Admit date: 10/13/2017 Discharge date: 10/15/2017  Admission Diagnoses:  Active Problems:   Primary localized osteoarthritis of right knee   Discharge Diagnoses:  Same  Past Medical History:  Diagnosis Date  . Coronary artery disease   . DVT (deep venous thrombosis) (HCC)    right lower leg  . Hypercholesterolemia   . Hypertension   . Myocardial infarction (HCC)   . Osteoarthritis   . Osteoarthritis of right knee     Surgeries: Procedure(s): RIGHT TOTAL KNEE ARTHROPLASTY on 10/13/2017   Consultants:   Discharged Condition: Improved  Hospital Course: Joseph Kidd is an 67 y.o. male who was admitted 10/13/2017 for operative treatment of primary localized osteoarthritis right knee. Patient has severe unremitting pain that affects sleep, daily activities, and work/hobbies. After pre-op clearance the patient was taken to the operating room on 10/13/2017 and underwent  Procedure(s): RIGHT TOTAL KNEE ARTHROPLASTY.    Patient was given perioperative antibiotics:  Anti-infectives (From admission, onward)   Start     Dose/Rate Route Frequency Ordered Stop   10/13/17 1200  ceFAZolin (ANCEF) IVPB 2g/100 mL premix     2 g 200 mL/hr over 30 Minutes Intravenous Every 6 hours 10/13/17 1147 10/13/17 1751   10/13/17 0600  ceFAZolin (ANCEF) IVPB 2g/100 mL premix     2 g 200 mL/hr over 30 Minutes Intravenous On call to O.R. 10/13/17 7829 10/13/17 0758       Patient was given sequential compression devices, early ambulation, and chemoprophylaxis to prevent DVT.  Patient benefited maximally from hospital stay and there were no complications.    Recent vital signs:  Patient Vitals for the past 24 hrs:  BP Temp Temp src Pulse Resp SpO2  10/15/17 0424 133/83 98.5 F (36.9 C) Oral 75 16 96 %  10/14/17 2145 (!) 141/87 99.1 F (37.3 C) Oral 78 16 99 %  10/14/17 1500 (!) 150/84 97.9 F (36.6 C) Oral 68 16 98 %      Recent laboratory studies:  Recent Labs    10/14/17 0453 10/15/17 0605  WBC 11.0* 8.5  HGB 11.4* 11.2*  HCT 35.3* 34.5*  PLT 193 177  NA 138 139  K 4.3 4.4  CL 108 103  CO2 26 29  BUN 19 18  CREATININE 1.06 0.97  GLUCOSE 154* 108*  CALCIUM 8.6* 8.6*     Discharge Medications:   Allergies as of 10/15/2017      Reactions   Atorvastatin    Severe myalgias   Chlorhexidine Rash      Medication List    TAKE these medications   aspirin EC 81 MG tablet Take one tab bid x 4 weeks and then back to normal schedule.  This is to prevent blood clots What changed:    how much to take  how to take this  when to take this  additional instructions   diltiazem 120 MG 24 hr capsule Commonly known as:  TIAZAC TAKE 1 CAPSULE BY MOUTH EVERY DAY   ezetimibe 10 MG tablet Commonly known as:  ZETIA TAKE ONE TABLET BY MOUTH EVERY DAY   oxyCODONE-acetaminophen 5-325 MG tablet Commonly known as:  PERCOCET Take 1-2 tabs po q4-6 hours prn pain   rosuvastatin 10 MG tablet Commonly known as:  CRESTOR Take 1 tablet (10 mg total) by mouth daily. MUST GET ADDT'L REFILLS AT UPCOMING APPT       Diagnostic Studies: Dg Chest 2 View  Result Date: 10/01/2017 CLINICAL DATA:  Preoperative evaluation for upcoming knee surgery EXAM: CHEST - 2 VIEW COMPARISON:  08/11/2016 FINDINGS: Cardiac shadow is stable. Postsurgical changes are again noted. The lungs are hyper aerated bilaterally without focal infiltrate or sizable effusion. No bony abnormality is seen. IMPRESSION: COPD without acute abnormality. Electronically Signed   By: Alcide CleverMark  Lukens M.D.   On: 10/01/2017 15:28   Dg Knee Right Port  Result Date: 10/13/2017 CLINICAL DATA:  Status post right knee joint replacement due to osteoarthritis. EXAM: PORTABLE RIGHT KNEE - 1-2 VIEW COMPARISON:  None in PACs FINDINGS: The patient has undergone total right knee joint prosthesis placement. Radiographic positioning of the prosthetic components is good.  The interface with the native bone appears normal. Small amounts of fluid and air are noted in the anterior aspect of the soft tissues. Surgical skin staples are present. IMPRESSION: No immediate complication following total right knee joint prosthesis placement. Electronically Signed   By: David  [place] M.D.   On: 10/13/2017 11:24    Disposition: Discharge disposition: 01-Home or Self Care         Follow-up Information    Valeria BatmanWhitfield, Peter W, MD. Schedule an appointment as soon as possible for a visit in 2 weeks.   Specialty:  Orthopedic Surgery Contact information: [address] [place] [address] [phone]            Signed: Cristie HemMary L Stanbery 10/15/2017, 7:37 AM

## 2017-10-15 NOTE — Progress Notes (Signed)
PATIENT ID: Joseph Kidd        MRN:  [id number]          DOB/AGE: 05/11/1950 / 67 y.o.    Norlene Campbell, MD   Jacqualine Code, PA-C [address] [place], [place]  [address]                             [phone]   PROGRESS NOTE  Subjective:  negative for Chest Pain  negative for Shortness of Breath  negative for Nausea/Vomiting   negative for Calf Pain    Tolerating Diet: yes         Patient reports pain as mild.     Comfortable without complaints  Objective: Vital signs in last 24 hours:    Patient Vitals for the past 24 hrs:  BP Temp Temp src Pulse Resp SpO2  10/15/17 0424 133/83 98.5 F (36.9 C) Oral 75 16 96 %  10/14/17 2145 (!) 141/87 99.1 F (37.3 C) Oral 78 16 99 %  10/14/17 1500 (!) 150/84 97.9 F (36.6 C) Oral 68 16 98 %      Intake/Output from previous day:   06/19 0701 - 06/20 0700 In: 720 [P.O.:720] Out: 250 [Urine:250]   Intake/Output this shift:   No intake/output data recorded.   Intake/Output      06/19 0701 - 06/20 0700 06/20 0701 - 06/21 0700   P.O. 720    I.V. (mL/kg)     IV Piggyback     Total Intake(mL/kg) 720 (6.8)    Urine (mL/kg/hr) 250 (0.1)    Stool 0    Blood     Total Output 250    Net +470         Urine Occurrence 2 x    Stool Occurrence 1 x       LABORATORY DATA: Recent Labs    10/13/17 1218 10/14/17 0453 10/15/17 0605  WBC 7.2 11.0* 8.5  HGB 13.1 11.4* 11.2*  HCT 41.1 35.3* 34.5*  PLT 187 193 177   Recent Labs    10/13/17 1218 10/14/17 0453 10/15/17 0605  NA  --  138 139  K  --  4.3 4.4  CL  --  108 103  CO2  --  26 29  BUN  --  19 18  CREATININE 0.99 1.06 0.97  GLUCOSE  --  154* 108*  CALCIUM  --  8.6* 8.6*   Lab Results  Component Value Date   INR 1.11 10/01/2017   INR 1.06 11/12/2016   INR 1.77 07/04/2016    Recent Radiographic Studies :  Dg Chest 2 View  Result Date: 10/01/2017 CLINICAL DATA:  Preoperative evaluation for upcoming knee surgery EXAM: CHEST - 2 VIEW COMPARISON:   08/11/2016 FINDINGS: Cardiac shadow is stable. Postsurgical changes are again noted. The lungs are hyper aerated bilaterally without focal infiltrate or sizable effusion. No bony abnormality is seen. IMPRESSION: COPD without acute abnormality. Electronically Signed   By: Alcide Clever M.D.   On: 10/01/2017 15:28   Dg Knee Right Port  Result Date: 10/13/2017 CLINICAL DATA:  Status post right knee joint replacement due to osteoarthritis. EXAM: PORTABLE RIGHT KNEE - 1-2 VIEW COMPARISON:  None in PACs FINDINGS: The patient has undergone total right knee joint prosthesis placement. Radiographic positioning of the prosthetic components is good. The interface with the native bone appears normal. Small amounts of fluid and air are noted in the anterior aspect  of the soft tissues. Surgical skin staples are present. IMPRESSION: No immediate complication following total right knee joint prosthesis placement. Electronically Signed   By: David  [place] M.D.   On: 10/13/2017 11:24     Examination:  General appearance: alert, cooperative and no distress  Wound Exam: clean, dry, intact   Drainage:  None: wound tissue dry  Motor Exam: EHL, FHL, Anterior Tibial and Posterior Tibial Intact  Sensory Exam: Superficial Peroneal, Deep Peroneal and Tibial normal  Vascular Exam: Normal  Assessment:    2 Days Post-Op  Procedure(s) (LRB): RIGHT TOTAL KNEE ARTHROPLASTY (Right)  ADDITIONAL DIAGNOSIS:  Active Problems:   Primary localized osteoarthritis of right knee     Plan: Physical Therapy as ordered Partial Weight Bearing @ 50% (PWB)  DVT Prophylaxis:  Aspirin, Foot Pumps and TED hose  DISCHARGE PLAN: Home  DISCHARGE NEEDS: HHPT, CPM, Walker and 3-in-1 comode seat-should have equipment at home, doing well with PT, no related problems-will discharge this am to home        Jacqualine CodeBrian Petrarca, Cordelia Poche-C [facility]  10/15/2017 9:05 AM  Patient ID: Joseph Kidd, male   DOB: 03/21/1951, 67 y.o.    MRN: [id number]

## 2017-10-16 ENCOUNTER — Telehealth (INDEPENDENT_AMBULATORY_CARE_PROVIDER_SITE_OTHER): Payer: Self-pay | Admitting: Orthopaedic Surgery

## 2017-10-16 NOTE — Telephone Encounter (Signed)
1 aspirin tab twice a day until seen in office

## 2017-10-16 NOTE — Telephone Encounter (Signed)
Notified pt about meds. Pt states he will contact us if needed

## 2017-10-16 NOTE — Telephone Encounter (Signed)
Patient called stating he received his prescription for Aspirin 81 mg and the directions stated 1 tablet /1 time day for 4 weeks.  Patient states Dr. Cleophas DunkerWhitfield told him to take 2 tablets per day for 2 weeks and then 1 tablet per day.  Patient called to confirm. I spoke with Dr. Cleophas DunkerWhitfield who confirmed that patient should take 2 tablets per day for 2 weeks.

## 2017-10-16 NOTE — Telephone Encounter (Signed)
PLEASE ADVISE.

## 2017-10-22 DIAGNOSIS — M1711 Unilateral primary osteoarthritis, right knee: Secondary | ICD-10-CM | POA: Diagnosis not present

## 2017-10-26 ENCOUNTER — Ambulatory Visit (INDEPENDENT_AMBULATORY_CARE_PROVIDER_SITE_OTHER): Payer: Medicare Other | Admitting: Orthopaedic Surgery

## 2017-10-26 ENCOUNTER — Encounter (INDEPENDENT_AMBULATORY_CARE_PROVIDER_SITE_OTHER): Payer: Self-pay | Admitting: Orthopaedic Surgery

## 2017-10-26 VITALS — BP 123/78 | HR 71 | Ht 71.0 in | Wt 225.0 lb

## 2017-10-26 DIAGNOSIS — M1711 Unilateral primary osteoarthritis, right knee: Secondary | ICD-10-CM | POA: Diagnosis not present

## 2017-10-26 DIAGNOSIS — Z96651 Presence of right artificial knee joint: Secondary | ICD-10-CM

## 2017-10-26 MED ORDER — LIDOCAINE HCL 1 % IJ SOLN
2.0000 mL | INTRAMUSCULAR | Status: AC | PRN
  Administered 2017-10-26: 2 mL

## 2017-10-26 MED ORDER — BUPIVACAINE HCL 0.5 % IJ SOLN
2.0000 mL | INTRAMUSCULAR | Status: AC | PRN
  Administered 2017-10-26: 2 mL via INTRA_ARTICULAR

## 2017-10-26 NOTE — Progress Notes (Signed)
Office Visit Note   Patient: Joseph Kidd           Date of Birth: 01/21/1951           MRN: [id number] Visit Date: 10/26/2017              Requested by: April MansonWhite, Marsha L, NP [address] [place], [place] [address] PCP: April MansonWhite, Marsha L, NP   Assessment & Plan: Visit Diagnoses:  1. History of total right knee replacement     Plan: 2 weeks status post primary right total knee replacement and doing well.  No longer taking pain medicine.  Had a large hemarthrosis.  I aspirated 70 cc of blood with significant relief of pain and increased range of motion.  Continue with physical therapy.  Using a single-point cane.  Continue with aspirin a day office 2 weeks.  Staples removed Steri-Strips applied.  Postop films performed in recovery room reveal good position of components  Follow-Up Instructions: Return in about 2 weeks (around 11/09/2017).   Orders:  Orders Placed This Encounter  Procedures  . Large Joint Inj: R knee   No orders of the defined types were placed in this encounter.     Procedures: Large Joint Inj: R knee on 10/26/2017 2:16 PM Indications: pain and diagnostic evaluation Details: 22 G 1.5 in needle, anteromedial approach  Arthrogram: No  Medications: 2 mL lidocaine 1 %; 2 mL bupivacaine 0.5 % Aspirate: 70 mL bloody Outcome: tolerated well, no immediate complications Procedure, treatment alternatives, risks and benefits explained, specific risks discussed. Consent was given by the patient. Immediately prior to procedure a time out was called to verify the correct patient, procedure, equipment, support staff and site/side marked as required. Patient was prepped and draped in the usual sterile fashion.       Clinical Data: No additional findings.   Subjective: Chief Complaint  Patient presents with  . Post-op Follow-up    10/13/17 R TKA SURGERY, THINGS GOING GOOD  No related fever chills shortness of breath or chest pain he is "stiff".  Not taking any  pain medicines.  Using a single-point cane  HPI  Review of Systems  Constitutional: Negative for fatigue and fever.  HENT: Negative for ear pain.   Eyes: Negative for pain.  Respiratory: Negative for cough and shortness of breath.   Cardiovascular: Positive for leg swelling.  Gastrointestinal: Negative for constipation and diarrhea.  Genitourinary: Negative for difficulty urinating.  Musculoskeletal: Negative for back pain and neck pain.  Skin: Negative for rash.  Allergic/Immunologic: Negative for food allergies.  Neurological: Negative for weakness and numbness.  Hematological: Bruises/bleeds easily.  Psychiatric/Behavioral: Positive for sleep disturbance.     Objective: Vital Signs: BP 123/78 (BP Location: Left Arm, Patient Position: Sitting, Cuff Size: Normal)   Pulse 71   Ht 5\' 11"  (1.803 m)   Wt 225 lb (102.1 kg)   BMI 31.38 kg/m   Physical Exam  Ortho Exam awake alert and oriented x3.  Comfortable sitting.  Incision right knee and healing without problem.  Clips removed and Steri-Strips applied.  Has almost full knee extension but only flexed about 70 degrees.  I aspirated 70 cc of old blood which point he was able to flex just about 90 degrees.  No calf pain.  No ankle edema.  No instability.  Neurovascular exam intact  Specialty Comments:  No specialty comments available.  Imaging: No results found.   PMFS History: Patient Active Problem List  Diagnosis Date Noted  . Primary localized osteoarthritis of right knee 10/13/2017  . Primary osteoarthritis of left knee 11/25/2016  . Osteoarthritis of knee 11/25/2016  . S/P total knee replacement using cement, left 11/25/2016  . S/P CABG x 5   . Coronary artery disease 07/04/2016  . Non-ST elevation (NSTEMI) myocardial infarction (HCC) 07/03/2016  . HTN (hypertension) 07/03/2016  . Hypercholesterolemia 07/03/2016  . NSTEMI (non-ST elevated myocardial infarction) (HCC) 07/03/2016  . Chest pain 07/03/2016   Past  Medical History:  Diagnosis Date  . Coronary artery disease   . DVT (deep venous thrombosis) (HCC)    right lower leg  . Hypercholesterolemia   . Hypertension   . Myocardial infarction (HCC)   . Osteoarthritis   . Osteoarthritis of right knee     Family History  Problem Relation Age of Onset  . Heart disease Mother   . Hypercholesterolemia Mother     Past Surgical History:  Procedure Laterality Date  . CARDIAC CATHETERIZATION    . CORONARY ARTERY BYPASS GRAFT N/A 07/04/2016   Procedure: CORONARY ARTERY BYPASS GRAFT times five with left internal mammary artery to Left Anterior Descending artery : endoscopic harvest of right saphenous vein with grafts to Diagonal, Right Coronary, Obtuse Marginal 1, Obtuse Marginal 3 Arteries. Insertion of right groin femoral a line;  Surgeon: Delight Ovens, MD;  Location: [facility] OR;  Service: Open Heart Surgery;  Laterality: N/A;  . INTRAOPERATIVE TRANSESOPHAGEAL ECHOCARDIOGRAM N/A 07/04/2016   Procedure: INTRAOPERATIVE TRANSESOPHAGEAL ECHOCARDIOGRAM;  Surgeon: Delight Ovens, MD;  Location: [facility] OR;  Service: Open Heart Surgery;  Laterality: N/A;  . LEFT HEART CATH AND CORONARY ANGIOGRAPHY N/A 07/03/2016   Procedure: Left Heart Cath and Coronary Angiography;  Surgeon: Abdulkareem Badolato M [place], MD;  Location: [facility] INVASIVE CV LAB;  Service: Cardiovascular;  Laterality: N/A;  . TOTAL KNEE ARTHROPLASTY Left 11/25/2016   Procedure: LEFT TOTAL KNEE ARTHROPLASTY;  Surgeon: Valeria Batman, MD;  Location: [facility] OR;  Service: Orthopedics;  Laterality: Left;  . TOTAL KNEE ARTHROPLASTY Right 10/13/2017   Procedure: RIGHT TOTAL KNEE ARTHROPLASTY;  Surgeon: Valeria Batman, MD;  Location: [facility] OR;  Service: Orthopedics;  Laterality: Right;  FEMORAL NERVE BLOCK   Social History   Occupational History  . Occupation: retired Copywriter, advertising  Tobacco Use  . Smoking status: Never Smoker  . Smokeless tobacco: Never Used  Substance and Sexual Activity  . Alcohol use: Yes    Comment: very  seldom  . Drug use: No  . Sexual activity: Not on file

## 2017-10-27 DIAGNOSIS — M1711 Unilateral primary osteoarthritis, right knee: Secondary | ICD-10-CM | POA: Diagnosis not present

## 2017-10-30 DIAGNOSIS — M1711 Unilateral primary osteoarthritis, right knee: Secondary | ICD-10-CM | POA: Diagnosis not present

## 2017-11-02 DIAGNOSIS — M1711 Unilateral primary osteoarthritis, right knee: Secondary | ICD-10-CM | POA: Diagnosis not present

## 2017-11-04 DIAGNOSIS — M1711 Unilateral primary osteoarthritis, right knee: Secondary | ICD-10-CM | POA: Diagnosis not present

## 2017-11-06 DIAGNOSIS — M1711 Unilateral primary osteoarthritis, right knee: Secondary | ICD-10-CM | POA: Diagnosis not present

## 2017-11-09 DIAGNOSIS — M1711 Unilateral primary osteoarthritis, right knee: Secondary | ICD-10-CM | POA: Diagnosis not present

## 2017-11-11 DIAGNOSIS — M1711 Unilateral primary osteoarthritis, right knee: Secondary | ICD-10-CM | POA: Diagnosis not present

## 2017-11-13 DIAGNOSIS — M1711 Unilateral primary osteoarthritis, right knee: Secondary | ICD-10-CM | POA: Diagnosis not present

## 2017-11-16 ENCOUNTER — Encounter (INDEPENDENT_AMBULATORY_CARE_PROVIDER_SITE_OTHER): Payer: Self-pay | Admitting: Orthopaedic Surgery

## 2017-11-16 ENCOUNTER — Ambulatory Visit (INDEPENDENT_AMBULATORY_CARE_PROVIDER_SITE_OTHER): Payer: Medicare Other | Admitting: Orthopaedic Surgery

## 2017-11-16 VITALS — BP 150/90 | HR 67 | Ht 71.0 in | Wt 225.0 lb

## 2017-11-16 DIAGNOSIS — Z96651 Presence of right artificial knee joint: Secondary | ICD-10-CM

## 2017-11-16 DIAGNOSIS — M1711 Unilateral primary osteoarthritis, right knee: Secondary | ICD-10-CM | POA: Diagnosis not present

## 2017-11-16 MED ORDER — OXYCODONE HCL 5 MG PO CAPS: 5.0000 mg | ORAL_CAPSULE | ORAL | 0 refills | Status: DC | PRN

## 2017-11-16 NOTE — Progress Notes (Signed)
Office Visit Note   Patient: Joseph Kidd           Date of Birth: Sep 24, 1950           MRN: [id number] Visit Date: 11/16/2017              Requested by: April Manson, NP [address] [place] [address] PCP: April Manson, NP   Assessment & Plan: Visit Diagnoses:  1. History of total right knee replacement     Plan: 1 month status post primary right total knee replacement doing well with the exception of gaining full extension.  Not using any ambulatory aid.  Office 2 months.  Renew OxyIR No. 30 Follow-Up Instructions: Return in about 2 months (around 01/17/2018).   Orders:  No orders of the defined types were placed in this encounter.  Meds ordered this encounter  Medications  . oxycodone (OXY-IR) 5 MG capsule    Sig: Take 1 capsule (5 mg total) by mouth every 4 (four) hours as needed.    Dispense:  30 capsule    Refill:  0      Procedures: No procedures performed   Clinical Data: No additional findings.   Subjective: Chief Complaint  Patient presents with  . Follow-up    10/13/17 R TKR, GOING GOOD ANF DOING PT    HPI  Review of Systems  Constitutional: Negative for fatigue and fever.  HENT: Negative for ear pain.   Eyes: Negative for pain.  Respiratory: Negative for cough and shortness of breath.   Cardiovascular: Positive for leg swelling.  Gastrointestinal: Negative for constipation and diarrhea.  Genitourinary: Negative for difficulty urinating.  Musculoskeletal: Negative for back pain and neck pain.  Skin: Negative for rash.  Allergic/Immunologic: Negative for food allergies.  Neurological: Positive for weakness. Negative for numbness.  Hematological: Bruises/bleeds easily.  Psychiatric/Behavioral: Positive for sleep disturbance.     Objective: Vital Signs: BP (!) 150/90 (BP Location: Left Arm, Patient Position: Sitting, Cuff Size: Normal)   Pulse 67   Ht 5\' 11"  (1.803 m)   Wt 225 lb (102.1 kg)   BMI 31.38 kg/m    Physical Exam  Constitutional: He is oriented to person, place, and time. He appears well-developed and well-nourished.  HENT:  Mouth/Throat: Oropharynx is clear and moist.  Eyes: Pupils are equal, round, and reactive to light. EOM are normal.  Pulmonary/Chest: Effort normal.  Neurological: He is alert and oriented to person, place, and time.  Skin: Skin is warm and dry.  Psychiatric: He has a normal mood and affect. His behavior is normal.    Ortho Exam awake alert and oriented x3.  Comfortable sitting.  Slight limp when he first gets up from a sitting position as his right knee is a little "stiff".  Not using any lacks about 5 to 6 degrees to full knee extension passively and then actively has 105 degrees.  No instability.  Still has some thickened scar tissue around the knee.  One small area of redness probably a reaction to the Monocryl suture.  Neurovascular exam intact. Specialty Comments:  No specialty comments available.  Imaging: No results found.   PMFS History: Patient Active Problem List   Diagnosis Date Noted  . Primary localized osteoarthritis of right knee 10/13/2017  . Primary osteoarthritis of left knee 11/25/2016  . Osteoarthritis of knee 11/25/2016  . S/P total knee replacement using cement, left 11/25/2016  . S/P CABG x 5   . Coronary  artery disease 07/04/2016  . Non-ST elevation (NSTEMI) myocardial infarction (HCC) 07/03/2016  . HTN (hypertension) 07/03/2016  . Hypercholesterolemia 07/03/2016  . NSTEMI (non-ST elevated myocardial infarction) (HCC) 07/03/2016  . Chest pain 07/03/2016   Past Medical History:  Diagnosis Date  . Coronary artery disease   . DVT (deep venous thrombosis) (HCC)    right lower leg  . Hypercholesterolemia   . Hypertension   . Myocardial infarction (HCC)   . Osteoarthritis   . Osteoarthritis of right knee     Family History  Problem Relation Age of Onset  . Heart disease Mother   . Hypercholesterolemia Mother     Past  Surgical History:  Procedure Laterality Date  . CARDIAC CATHETERIZATION    . CORONARY ARTERY BYPASS GRAFT N/A 07/04/2016   Procedure: CORONARY ARTERY BYPASS GRAFT times five with left internal mammary artery to Left Anterior Descending artery : endoscopic harvest of right saphenous vein with grafts to Diagonal, Right Coronary, Obtuse Marginal 1, Obtuse Marginal 3 Arteries. Insertion of right groin femoral a line;  Surgeon: Delight OvensEdward B Gerhardt, MD;  Location: [facility] OR;  Service: Open Heart Surgery;  Laterality: N/A;  . INTRAOPERATIVE TRANSESOPHAGEAL ECHOCARDIOGRAM N/A 07/04/2016   Procedure: INTRAOPERATIVE TRANSESOPHAGEAL ECHOCARDIOGRAM;  Surgeon: Delight OvensEdward B Gerhardt, MD;  [facility];  Service: Open Heart Surgery;  Laterality: N/A;  . LEFT HEART CATH AND CORONARY ANGIOGRAPHY N/A 07/03/2016   Procedure: Left Heart Cath and Coronary Angiography;  Surgeon: Nima Kemppainen M [place]Jordan, MD;  [facility];  Service: Cardiovascular;  Laterality: N/A;  . TOTAL KNEE ARTHROPLASTY Left 11/25/2016   Procedure: LEFT TOTAL KNEE ARTHROPLASTY;  Surgeon: Valeria BatmanWhitfield, [place] Throckmorton W, MD;  Location: [facility] OR;  Service: Orthopedics;  Laterality: Left;  . TOTAL KNEE ARTHROPLASTY Right 10/13/2017   Procedure: RIGHT TOTAL KNEE ARTHROPLASTY;  Surgeon: Valeria BatmanWhitfield, Greenley Martone W, MD;  [facility];  Service: Orthopedics;  Laterality: Right;  FEMORAL NERVE BLOCK   Social History   Occupational History  . Occupation: retired Copywriter, advertisinglineman  Tobacco Use  . Smoking status: Never Smoker  . Smokeless tobacco: Never Used  Substance and Sexual Activity  . Alcohol use: Yes    Comment: very seldom  . Drug use: No  . Sexual activity: Not on file

## 2017-11-16 NOTE — Addendum Note (Signed)
Addended by: Audrie LiaAUDLE, [place] Takacs H on: 11/16/2017 03:54 PM   Modules accepted: Orders

## 2017-11-18 DIAGNOSIS — M1711 Unilateral primary osteoarthritis, right knee: Secondary | ICD-10-CM | POA: Diagnosis not present

## 2017-12-10 ENCOUNTER — Other Ambulatory Visit: Payer: Self-pay | Admitting: Orthopaedic Surgery

## 2018-01-15 ENCOUNTER — Other Ambulatory Visit: Payer: Self-pay | Admitting: Orthopaedic Surgery

## 2018-01-22 ENCOUNTER — Ambulatory Visit (INDEPENDENT_AMBULATORY_CARE_PROVIDER_SITE_OTHER): Payer: Medicare Other | Admitting: Orthopaedic Surgery

## 2018-01-26 ENCOUNTER — Other Ambulatory Visit: Payer: Self-pay | Admitting: Cardiology

## 2018-01-26 ENCOUNTER — Other Ambulatory Visit: Payer: Self-pay | Admitting: Cardiovascular Disease

## 2018-03-15 DIAGNOSIS — Z23 Encounter for immunization: Secondary | ICD-10-CM | POA: Diagnosis not present

## 2018-03-24 ENCOUNTER — Ambulatory Visit: Payer: Medicare Other | Admitting: Cardiology

## 2018-04-03 ENCOUNTER — Other Ambulatory Visit: Payer: Self-pay | Admitting: Cardiovascular Disease

## 2018-05-10 NOTE — Progress Notes (Signed)
Cardiology Office Note    Date:  05/13/2018   ID:  JAQUEZ SEGRAVES, DOB 1950-10-20, MRN [id number]  PCP:  April Manson, NP  Cardiologist:  Dr. Halen Mossbarger [place]  Chief Complaint  Patient presents with  . Coronary Artery Disease  . Hypertension    History of Present Illness:  JAHAZIEL Kidd is a 68 y.o. male with PMH of HTN, HLD intolerant to statins, OA and  CAD s/p CABG. He presented to the hospital with inferior STEMI on 07/03/2016. Emergent cardiac catheterization performed on 07/03/16 showed 95% proximal to mid RCA stenosis, 30% ostial to proximal RCA stenosis, 95% proximal LAD, 80% mid LAD, 100% occluded proximal left circumflex with left to left and right-to-left collaterals, 90% ostial OM1 lesion, EF 50-55%. Echocardiogram performed on the same day showed EF 60-65%, mild LVH, dilated aortic root up to 40 mm, mild MR. He underwent 5 vessel CABG with LIMA to LAD, SVG to diagonal, sequential SVG to OM1 and OM 3, SVG to RCA by Dr. Tyrone Sage on 07/04/2013. Post procedure, the hospital course involved volume overload requiring diuresis and also atrial fibrillation with RVR which converted on IV amiodarone. He was also initiated on Crestor.  In July 28, 2016 he complained of persistent wheezing and beta blocker was discontinued. Lisinopril had been discontinued earlier.  He was maintaining NSR and amiodarone was stopped. He was noted to have right leg swelling. LE venous dopplers demonstrated right peroneal vein DVT. He was started on Xarelto and completed his course of therapy in July. In August 2018 he did undergo left TKR. He was given DVT prophylaxis with Xarelto. In June 2019 he had right TKR.   On follow up he is doing well. He has no chest pain, palpitations, dyspnea or edema. He has gained 17 lbs. He is not walking much but states he was able [place] without problems.   Past Medical History:  Diagnosis Date  . Coronary artery disease   . DVT (deep  venous thrombosis) (HCC)    right lower leg  . Hypercholesterolemia   . Hypertension   . Myocardial infarction (HCC)   . Osteoarthritis   . Osteoarthritis of right knee     Past Surgical History:  Procedure Laterality Date  . CARDIAC CATHETERIZATION    . CORONARY ARTERY BYPASS GRAFT N/A 07/04/2016   Procedure: CORONARY ARTERY BYPASS GRAFT times five with left internal mammary artery to Left Anterior Descending artery : endoscopic harvest of right saphenous vein with grafts to Diagonal, Right Coronary, Obtuse Marginal 1, Obtuse Marginal 3 Arteries. Insertion of right groin femoral a line;  Surgeon: Delight Ovens, MD;  Location: [facility] OR;  Service: Open Heart Surgery;  Laterality: N/A;  . INTRAOPERATIVE TRANSESOPHAGEAL ECHOCARDIOGRAM N/A 07/04/2016   Procedure: INTRAOPERATIVE TRANSESOPHAGEAL ECHOCARDIOGRAM;  Surgeon: Delight Ovens, MD;  Location[facility] OR;  Service: Open Heart Surgery;  Laterality: N/A;  . LEFT HEART CATH AND CORONARY ANGIOGRAPHY N/A 07/03/2016   Procedure: Left Heart Cath and Coronary Angiography;  Surgeon: Harlow Carrizales M [place], MD;  Location: [facility] INVASIVE CV LAB;  Service: Cardiovascular;  Laterality: N/A;  . TOTAL KNEE ARTHROPLASTY Left 11/25/2016   Procedure: LEFT TOTAL KNEE ARTHROPLASTY;  Surgeon: Valeria Batman, MD;  Location: [facility] OR;  Service: Orthopedics;  Laterality: Left;  . TOTAL KNEE ARTHROPLASTY Right 10/13/2017   Procedure: RIGHT TOTAL KNEE ARTHROPLASTY;  Surgeon: Valeria Batman, MD;  Location: [facility] OR;  Service: Orthopedics;  Laterality:  Right;  FEMORAL NERVE BLOCK    Current Medications: Outpatient Medications Prior to Visit  Medication Sig Dispense Refill  . aspirin EC 81 MG tablet Take 81 mg by mouth daily.    Marland Kitchen. ezetimibe (ZETIA) 10 MG tablet TAKE ONE TABLET BY MOUTH EVERY DAY 90 tablet 0  . rosuvastatin (CRESTOR) 10 MG tablet TAKE ONE TABLET BY MOUTH EVERY DAY 90 tablet 3  . aspirin EC 81 MG tablet Take one tab bid x 4 weeks and then back to normal schedule.   This is to prevent blood clots 28 tablet 3  . diltiazem (CARDIZEM CD) 120 MG 24 hr capsule TAKE 1 CAPSULE BY MOUTH EVERY DAY. MUST KEEP UPCOMING APPOINTMENT. 90 capsule 3  . diltiazem (TIAZAC) 120 MG 24 hr capsule TAKE 1 CAPSULE BY MOUTH EVERY DAY 90 capsule 1  . oxycodone (OXY-IR) 5 MG capsule Take 1 capsule (5 mg total) by mouth every 4 (four) hours as needed. 30 capsule 0   No facility-administered medications prior to visit.      Allergies:   Atorvastatin; Oxycodone-acetaminophen; and Chlorhexidine   Social History   Socioeconomic History  . Marital status: Married    Spouse name: Not on file  . Number of children: 1  . Years of education: Not on file  . Highest education level: Not on file  Occupational History  . Occupation: retired Recruitment consultantlineman  Social Needs  . Financial resource strain: Not on file  . Food insecurity:    Worry: Not on file    Inability: Not on file  . Transportation needs:    Medical: Not on file    Non-medical: Not on file  Tobacco Use  . Smoking status: Never Smoker  . Smokeless tobacco: Never Used  Substance and Sexual Activity  . Alcohol use: Yes    Comment: very seldom  . Drug use: No  . Sexual activity: Not on file  Lifestyle  . Physical activity:    Days per week: Not on file    Minutes per session: Not on file  . Stress: Not on file  Relationships  . Social connections:    Talks on phone: Not on file    Gets together: Not on file    Attends religious service: Not on file    Active member of club or organization: Not on file    Attends meetings of clubs or organizations: Not on file    Relationship status: Not on file  Other Topics Concern  . Not on file  Social History Narrative  . Not on file     Family History:  The patient's family history includes Heart disease in his mother; Hypercholesterolemia in his mother.   ROS:   Please see the history of present illness.    ROS All other systems reviewed and are negative.   PHYSICAL  EXAM:   VS:  BP (!) 168/102   Pulse 69   Ht 5\' 11"  (1.803 m)   Wt 242 lb 6.4 oz (110 kg)   BMI 33.81 kg/m    GENERAL:  Well appearing obese WM in NAD HEENT:  PERRL, EOMI, sclera are clear. Oropharynx is clear. NECK:  No jugular venous distention, carotid upstroke brisk and symmetric, no bruits, no thyromegaly or adenopathy LUNGS:  Clear to auscultation bilaterally CHEST:  Unremarkable HEART:  RRR,  PMI not displaced or sustained,S1 and S2 within normal limits, no S3, no S4: no clicks, no rubs, no murmurs ABD:  Soft, nontender. BS +, no masses  or bruits. No hepatomegaly, no splenomegaly EXT:  2 + pulses throughout, no edema, no cyanosis no clubbing SKIN:  Warm and dry.  No rashes NEURO:  Alert and oriented x 3. Cranial nerves II through XII intact. PSYCH:  Cognitively intact   Wt Readings from Last 3 Encounters:  05/13/18 242 lb 6.4 oz (110 kg)  11/16/17 225 lb (102.1 kg)  10/26/17 225 lb (102.1 kg)      Studies/Labs Reviewed:   EKG:  EKG is not  ordered today.   Recent Labs: 10/01/2017: ALT 44 10/15/2017: BUN 18; Creatinine, Ser 0.97; Hemoglobin 11.2; Platelets 177; Potassium 4.4; Sodium 139   Lipid Panel    Component Value Date/Time   CHOL 146 03/02/2017 0908   TRIG 76 03/02/2017 0908   HDL 34 (L) 03/02/2017 0908   CHOLHDL 4.3 03/02/2017 0908   VLDL 19 08/26/2016 1236   LDLCALC 95 03/02/2017 0908   Labs dated 05/26/17: CBC and CMET normal. Cholesterol 120, triglycerides 26, HDL 35, LDL 74. TSH and PSA normal.  Dated 10/15/17: chemistries normal. Hgb 11.2  Additional studies/ records that were reviewed today include:   Cath 07/03/2016 Conclusion     Prox RCA to Mid RCA lesion, 95 %stenosed.  Ost RCA to Prox RCA lesion, 30 %stenosed.  Mid RCA lesion, 40 %stenosed.  Prox LAD lesion, 95 %stenosed.  Mid LAD-2 lesion, 75 %stenosed.  Mid LAD-1 lesion, 80 %stenosed.  Ost 1st Mrg to 1st Mrg lesion, 90 %stenosed.  Prox Cx to Mid Cx lesion, 100 %stenosed.  The  left ventricular systolic function is normal.  LV end diastolic pressure is normal.  The left ventricular ejection fraction is 50-55% by visual estimate.   1. Critical 3 vessel obstructive CAD.     - complex 95% proximal LAD, 75-80% diffuse mid LAD    - 90% OM1    - 100% mid LCx with left to left and right to left collaterals    - 95% proximal RCA 2. Good LV function 3. Normal LVEDP  Plan: Patient is currently pain free- will aggressively threat medically with ASA, IV Ntg, IV heparin, beta blocker. Consult CT surgery  for CABG.      Echo 07/03/2016 LV EF: 60% -   65%  ------------------------------------------------------------------- Indications:      Chest pain 786.51.  ------------------------------------------------------------------- History:   Risk factors:  NSTEMI. Hypertension. Dyslipidemia.  ------------------------------------------------------------------- Study Conclusions  - Left ventricle: The cavity size was normal. Wall thickness was   increased in a pattern of mild LVH. Systolic function was normal.   The estimated ejection fraction was in the range of 60% to 65%.   Wall motion was normal; there were no regional wall motion   abnormalities. Left ventricular diastolic function parameters   were normal. - Aortic valve: Mildly calcified annulus. There was trivial   regurgitation. - Aorta: Mild aortic root dilatation. Aortic root dimension: 40 mm   (ED). - Mitral valve: There was mild regurgitation.    CABG x 5 by Dr. Tyrone Sage 07/04/2016 PRE-OPERATIVE DIAGNOSIS:  1. S/p NSTEMI 2. Coronary artery disease  POST-OPERATIVE DIAGNOSIS:  1. S/p NSTEMI 2. Coronary artery disease  PROCEDURE: INTRAOPERATIVE TRANSESOPHAGEAL ECHOCARDIOGRAM, MEDIAN STERNOTOMY for CORONARY ARTERY BYPASS GRAFT x 5 (LIMA to LAD, SVG to DIAGONAL, SVG SEQUENTIALLY to OM1 and OM3, and SVG to RCA) with  endoscopic harvest of right greater saphenous vein with grafts to    ASSESSMENT:      1. Coronary artery disease involving coronary bypass graft of native heart  without angina pectoris   2. S/P CABG x 5   3. Essential hypertension   4. Hypercholesterolemia      PLAN:  In order of problems listed above:  1. CAD s/p CABG in March 2018: he is asymptomatic. Continue ASA, Statin, Diltiazem.  2. Post op RLE peroneal DVT: resolved.  3. HTN: Blood pressure is elevated. Weight gain is probably contributing. Encourage increased aerobic activity. Weight loss. Will increase diltiazem CD to 240 mg daily. He will report back to me BP readings in 3 weeks.   4. HLD: Intolerant to Lipitor as before, he was started on Crestor 5 mg daily, then increased to 10 mg daily. Tolerating well. Will check fasting labs today.  5. Severe osteoarthritis knees. S/p left and right TKR.     Medication Adjustments/Labs and Tests Ordered: Current medicines are reviewed at length with the patient today.  Concerns regarding medicines are outlined above.  Medication changes, Labs and Tests ordered today are listed in the Patient Instructions below. Patient Instructions  Increase diltiazem to 240 mg daily. Report back to me in 3 weeks with your blood pressure readings  I will check lab work today     Signed, Jocob Dambach [place], MD  05/13/2018 9:40 AM    [facility] [address], [place], [place]  [address] Phone: [phone]; Fax: [phone]

## 2018-05-13 ENCOUNTER — Encounter: Payer: Self-pay | Admitting: Cardiology

## 2018-05-13 ENCOUNTER — Ambulatory Visit (INDEPENDENT_AMBULATORY_CARE_PROVIDER_SITE_OTHER): Payer: Medicare Other | Admitting: Cardiology

## 2018-05-13 VITALS — BP 168/102 | HR 69 | Ht 71.0 in | Wt 242.4 lb

## 2018-05-13 DIAGNOSIS — I10 Essential (primary) hypertension: Secondary | ICD-10-CM | POA: Diagnosis not present

## 2018-05-13 DIAGNOSIS — I25810 Atherosclerosis of coronary artery bypass graft(s) without angina pectoris: Secondary | ICD-10-CM

## 2018-05-13 DIAGNOSIS — E7800 Pure hypercholesterolemia, unspecified: Secondary | ICD-10-CM | POA: Diagnosis not present

## 2018-05-13 DIAGNOSIS — Z951 Presence of aortocoronary bypass graft: Secondary | ICD-10-CM | POA: Diagnosis not present

## 2018-05-13 LAB — LIPID PANEL
CHOL/HDL RATIO: 4.1 ratio (ref 0.0–5.0)
Cholesterol, Total: 138 mg/dL (ref 100–199)
HDL: 34 mg/dL — ABNORMAL LOW (ref >39)
LDL CALC: 89 mg/dL (ref 0–99)
TRIGLYCERIDES: 75 mg/dL (ref 0–149)
VLDL CHOLESTEROL CAL: 15 mg/dL (ref 5–40)

## 2018-05-13 LAB — HEPATIC FUNCTION PANEL
ALT: 33 IU/L (ref 0–44)
AST: 26 IU/L (ref 0–40)
Albumin: 4.3 g/dL (ref 3.6–4.8)
Alkaline Phosphatase: 79 IU/L (ref 39–117)
BILIRUBIN TOTAL: 0.5 mg/dL (ref 0.0–1.2)
Bilirubin, Direct: 0.14 mg/dL (ref 0.00–0.40)
Total Protein: 7.6 g/dL (ref 6.0–8.5)

## 2018-05-13 LAB — BASIC METABOLIC PANEL
BUN / CREAT RATIO: 16 (ref 10–24)
BUN: 15 mg/dL (ref 8–27)
CO2: 22 mmol/L (ref 20–29)
CREATININE: 0.96 mg/dL (ref 0.76–1.27)
Calcium: 9.6 mg/dL (ref 8.6–10.2)
Chloride: 104 mmol/L (ref 96–106)
GFR calc Af Amer: 94 mL/min/{1.73_m2} (ref >59)
GFR, EST NON AFRICAN AMERICAN: 81 mL/min/{1.73_m2} (ref >59)
GLUCOSE: 100 mg/dL — AB (ref 65–99)
Potassium: 4.6 mmol/L (ref 3.5–5.2)
SODIUM: 141 mmol/L (ref 134–144)

## 2018-05-13 MED ORDER — DILTIAZEM HCL ER COATED BEADS 240 MG PO CP24: 240.0000 mg | ORAL_CAPSULE | Freq: Every day | ORAL | 3 refills | Status: DC

## 2018-05-13 NOTE — Patient Instructions (Signed)
Increase diltiazem to 240 mg daily. Report back to me in 3 weeks with your blood pressure readings  I will check lab work today

## 2018-05-14 ENCOUNTER — Other Ambulatory Visit: Payer: Self-pay

## 2018-05-14 DIAGNOSIS — E7800 Pure hypercholesterolemia, unspecified: Secondary | ICD-10-CM

## 2018-05-14 DIAGNOSIS — I25810 Atherosclerosis of coronary artery bypass graft(s) without angina pectoris: Secondary | ICD-10-CM

## 2018-05-14 MED ORDER — ROSUVASTATIN CALCIUM 20 MG PO TABS: 20.0000 mg | ORAL_TABLET | Freq: Every day | ORAL | 3 refills | Status: DC

## 2018-06-14 ENCOUNTER — Telehealth: Payer: Self-pay

## 2018-06-14 DIAGNOSIS — I25810 Atherosclerosis of coronary artery bypass graft(s) without angina pectoris: Secondary | ICD-10-CM

## 2018-06-14 DIAGNOSIS — I10 Essential (primary) hypertension: Secondary | ICD-10-CM

## 2018-06-14 MED ORDER — LOSARTAN POTASSIUM 50 MG PO TABS: 50.0000 mg | ORAL_TABLET | Freq: Every day | ORAL | 6 refills | Status: DC

## 2018-06-14 NOTE — Telephone Encounter (Signed)
Spoke to patient Dr.Jordan to reviewed B/P readings you brought to office he advised to start Losartan 50 mg daily.Check bmet in 2 to 3 weeks.Lab orders mailed.

## 2018-06-30 ENCOUNTER — Other Ambulatory Visit: Payer: Self-pay | Admitting: *Deleted

## 2018-06-30 NOTE — Telephone Encounter (Signed)
This is Dr. Jordan's pt. °

## 2018-07-02 MED ORDER — EZETIMIBE 10 MG PO TABS: 10.0000 mg | ORAL_TABLET | Freq: Every day | ORAL | 3 refills | Status: DC

## 2018-07-12 DIAGNOSIS — I25810 Atherosclerosis of coronary artery bypass graft(s) without angina pectoris: Secondary | ICD-10-CM | POA: Diagnosis not present

## 2018-07-12 DIAGNOSIS — E7800 Pure hypercholesterolemia, unspecified: Secondary | ICD-10-CM | POA: Diagnosis not present

## 2018-07-12 DIAGNOSIS — I10 Essential (primary) hypertension: Secondary | ICD-10-CM | POA: Diagnosis not present

## 2018-07-12 LAB — LIPID PANEL
CHOL/HDL RATIO: 3.6 ratio (ref 0.0–5.0)
Cholesterol, Total: 113 mg/dL (ref 100–199)
HDL: 31 mg/dL — ABNORMAL LOW (ref >39)
LDL CALC: 70 mg/dL (ref 0–99)
Triglycerides: 58 mg/dL (ref 0–149)
VLDL Cholesterol Cal: 12 mg/dL (ref 5–40)

## 2018-07-12 LAB — BASIC METABOLIC PANEL
BUN / CREAT RATIO: 19 (ref 10–24)
BUN: 21 mg/dL (ref 8–27)
CO2: 23 mmol/L (ref 20–29)
Calcium: 9 mg/dL (ref 8.6–10.2)
Chloride: 105 mmol/L (ref 96–106)
Creatinine, Ser: 1.08 mg/dL (ref 0.76–1.27)
GFR calc Af Amer: 81 mL/min/{1.73_m2} (ref >59)
GFR calc non Af Amer: 70 mL/min/{1.73_m2} (ref >59)
GLUCOSE: 102 mg/dL — AB (ref 65–99)
POTASSIUM: 4.5 mmol/L (ref 3.5–5.2)
Sodium: 142 mmol/L (ref 134–144)

## 2018-07-12 LAB — HEPATIC FUNCTION PANEL
ALBUMIN: 4.1 g/dL (ref 3.8–4.8)
ALT: 31 IU/L (ref 0–44)
AST: 23 IU/L (ref 0–40)
Alkaline Phosphatase: 74 IU/L (ref 39–117)
Bilirubin Total: 0.4 mg/dL (ref 0.0–1.2)
Bilirubin, Direct: 0.15 mg/dL (ref 0.00–0.40)
TOTAL PROTEIN: 7.1 g/dL (ref 6.0–8.5)

## 2018-12-02 ENCOUNTER — Other Ambulatory Visit: Payer: Self-pay | Admitting: Cardiology

## 2019-01-24 DIAGNOSIS — Z23 Encounter for immunization: Secondary | ICD-10-CM | POA: Diagnosis not present

## 2019-02-06 IMAGING — CR DG CHEST 1V PORT
1 series · 1 of 1 positions shown · non-contrast
Comparison: none

CLINICAL DATA: Chest pain today, pre-op image before am surgery. Hx
of HTN.

EXAM:
PORTABLE CHEST - 1 VIEW

[AP]
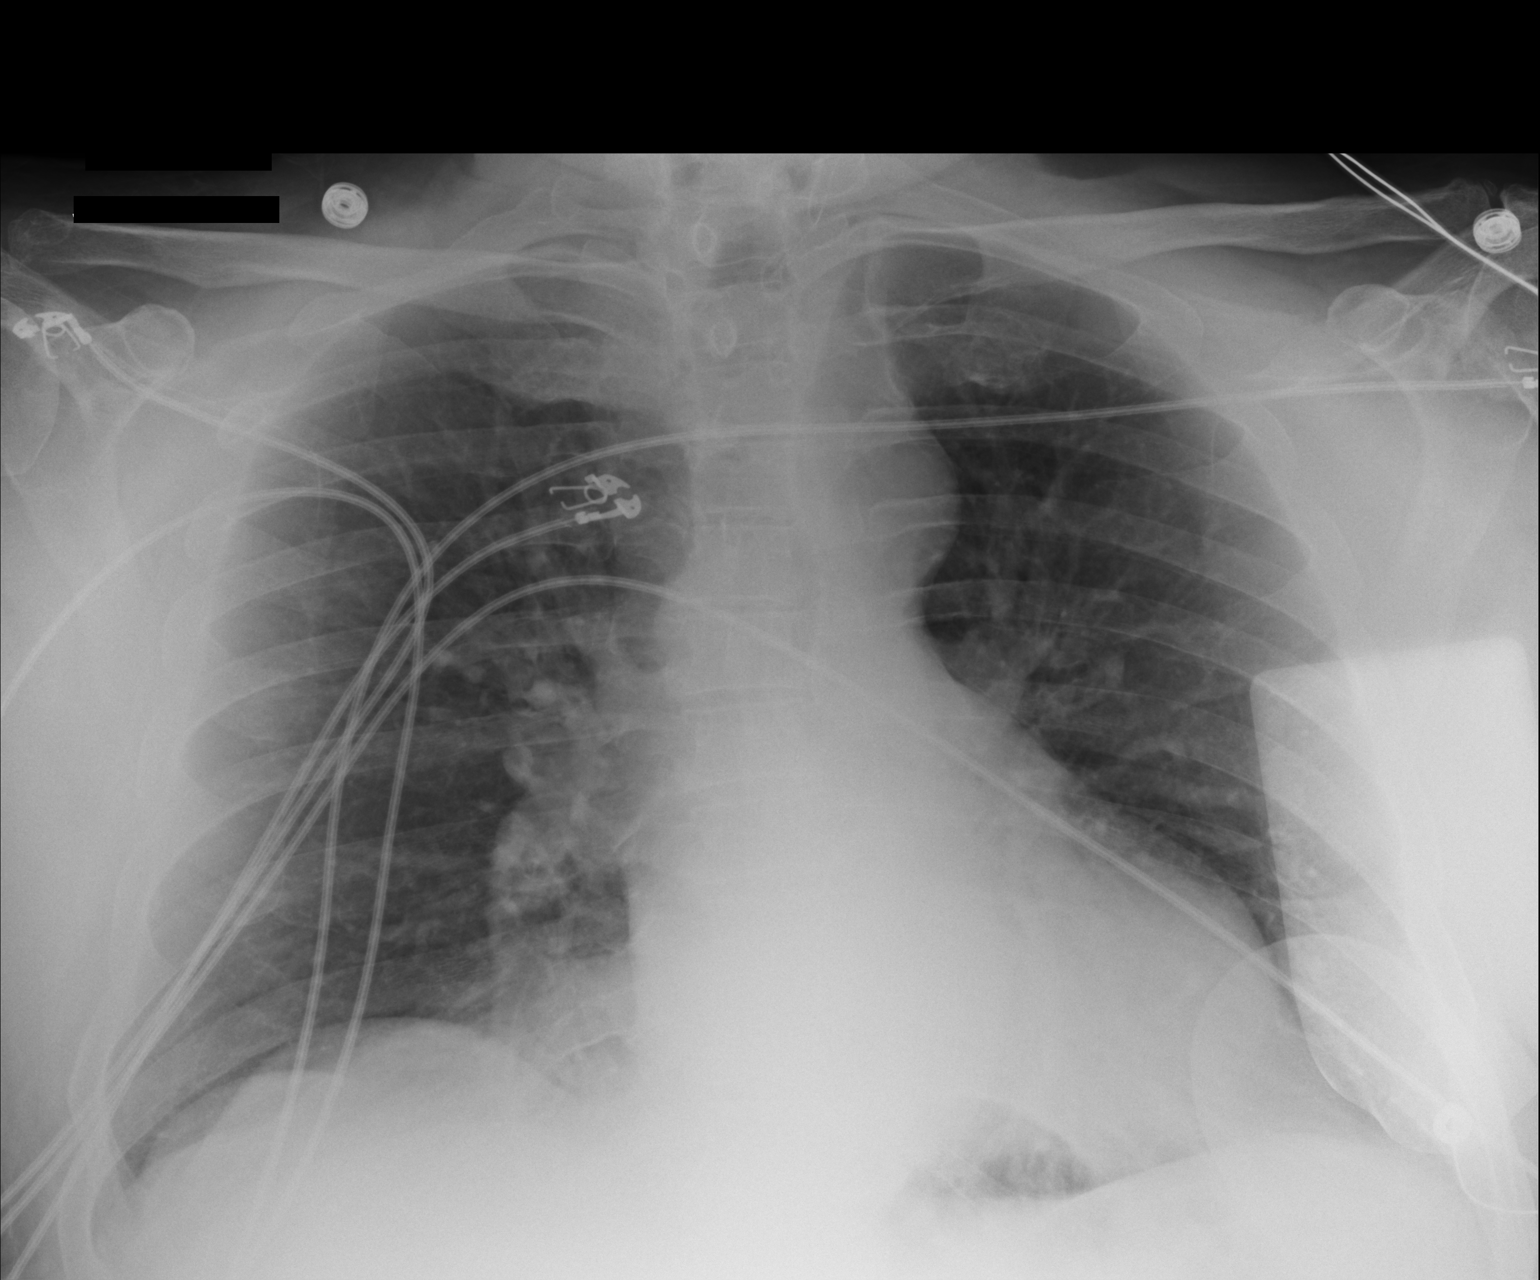

[1 of 1 positions shown; findings below may reference images not displayed]

FINDINGS: Monitoring leads overlie the patient.  Lungs clear.

Heart size and mediastinal contours are within normal limits.

No effusion.  No pneumothorax.

Visualized bones unremarkable.
IMPRESSION: No acute cardiopulmonary disease.

## 2019-02-07 IMAGING — CR DG CHEST 1V PORT
1 series · 1 of 1 positions shown · non-contrast
Comparison: Chest radiograph July 03, 2016

CLINICAL DATA: Status post CABG.

EXAM:
PORTABLE CHEST 1 VIEW

[AP]
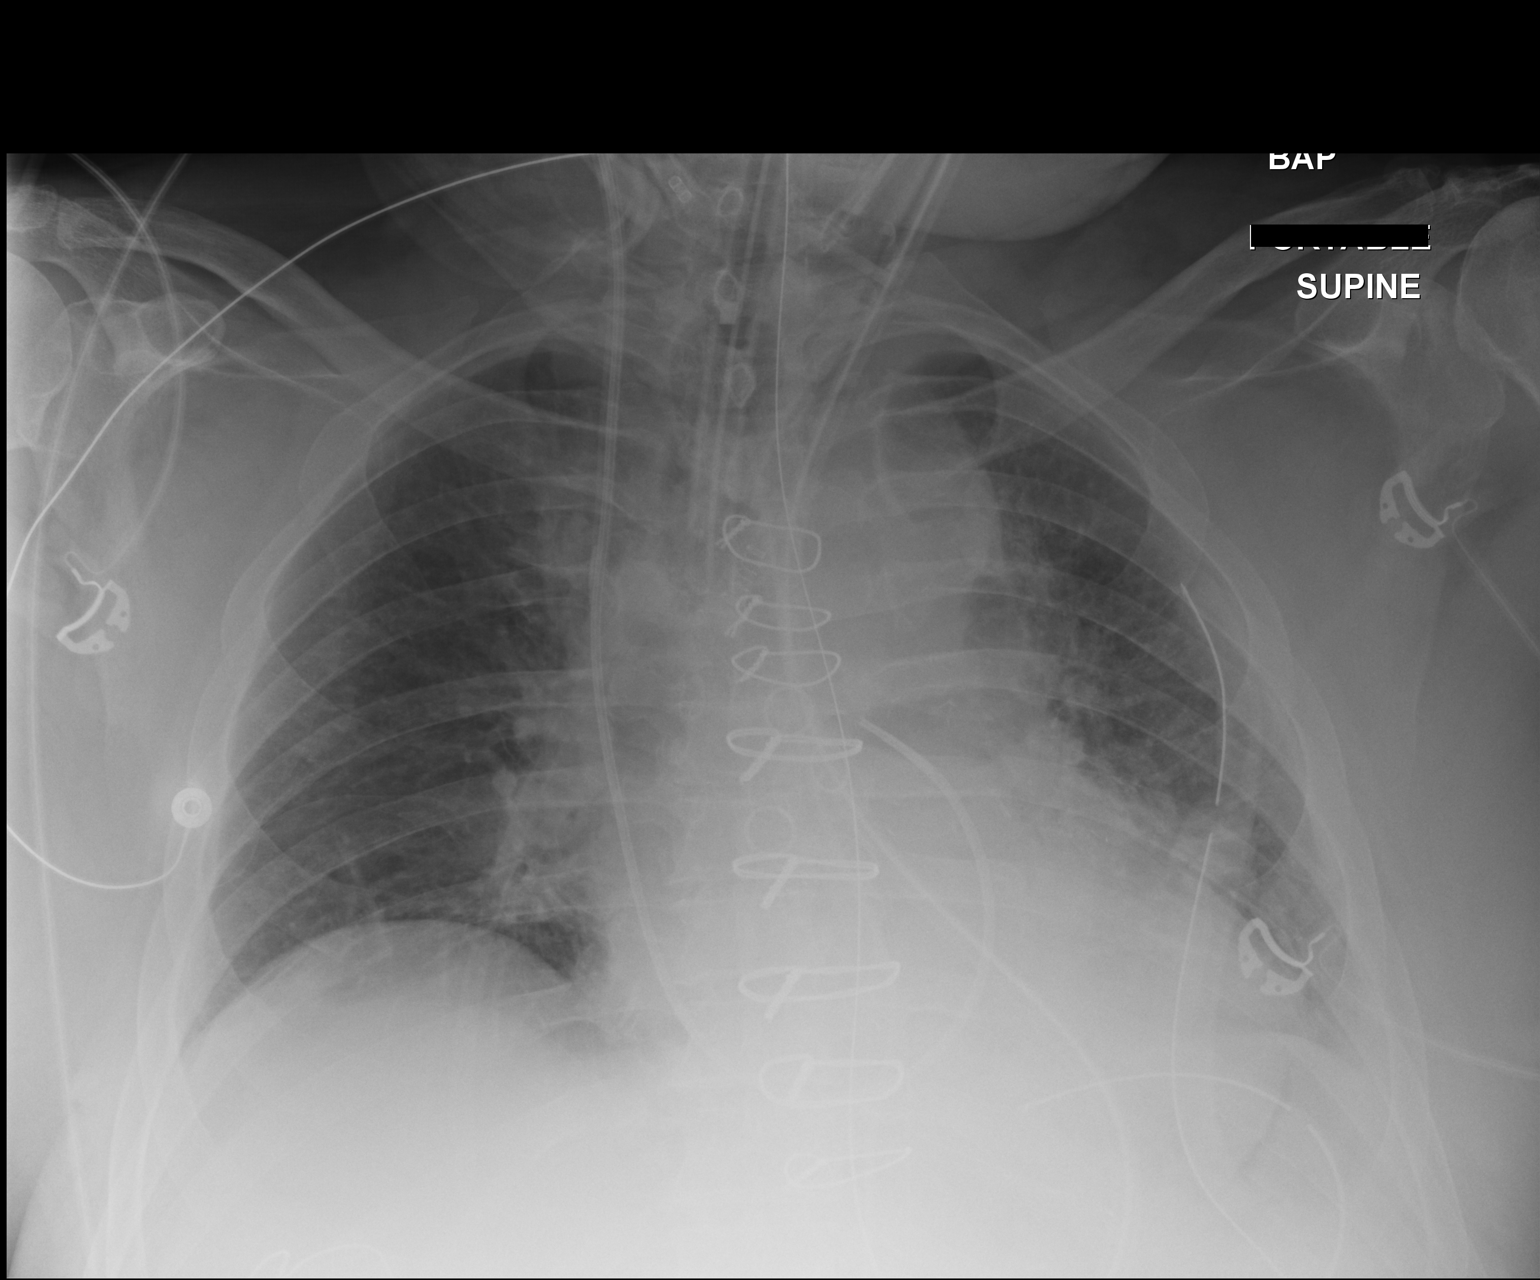

[1 of 1 positions shown; findings below may reference images not displayed]

FINDINGS: Endotracheal tube tip projects at the carina, though difficult to
visualize. Swan-Ganz catheter via RIGHT internal jugular venous
approach with distal tip projecting at main pulmonary artery.
Nasogastric tube tip projects at GE junction, side port in the
proximal stomach. LEFT chest tube with side port projecting within
the chest wall.

Cardiac silhouette is mildly enlarged. Low lung volumes without
pleural effusion or focal consolidation. No pneumothorax. Status
post median sternotomy for CABG.
IMPRESSION: Endotracheal tube tip projects at the carina, recommend 1-2 cm of
retraction. Additional well-positioned life-support lines.

Stable cardiomegaly, no acute pulmonary process.

## 2019-02-10 IMAGING — CR DG CHEST 1V PORT
1 series · 1 of 1 positions shown · non-contrast
Comparison: 07/06/2016 .

CLINICAL DATA: Atelectasis.

EXAM:
PORTABLE CHEST 1 VIEW

[AP]
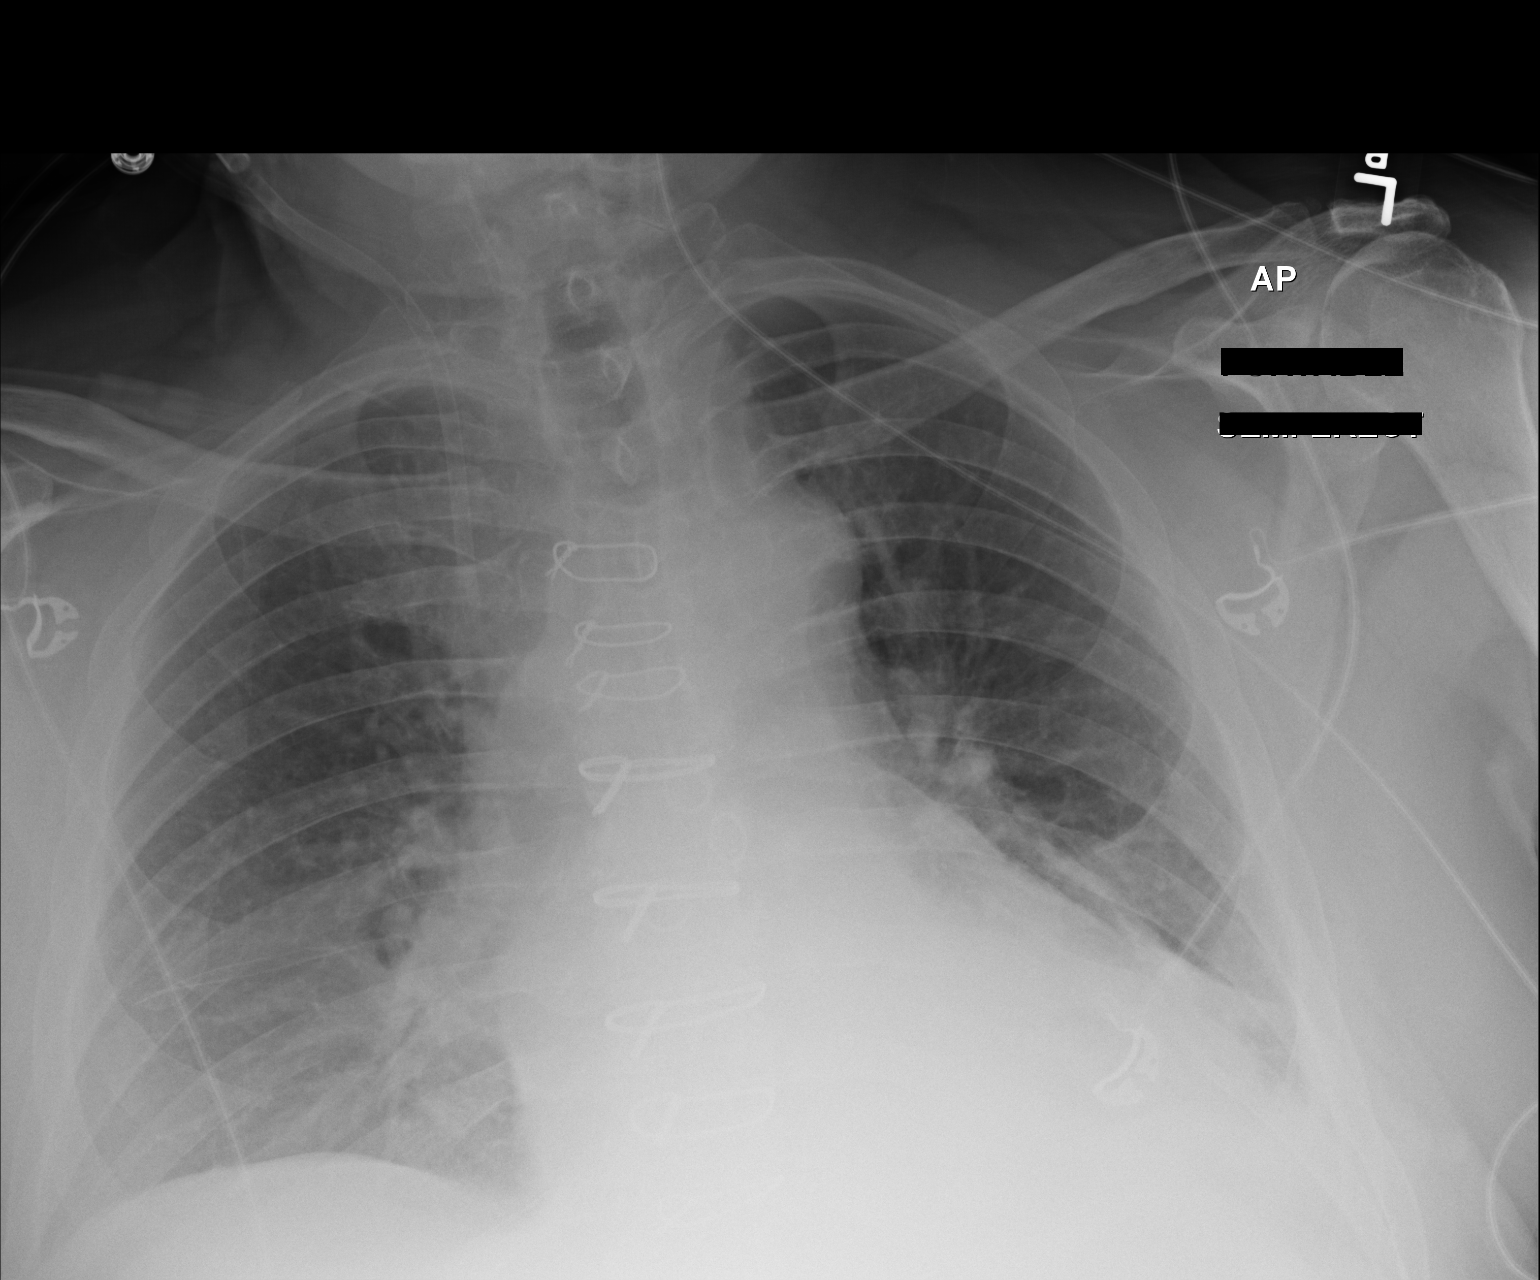

[1 of 1 positions shown; findings below may reference images not displayed]

FINDINGS: Right IJ sheath in stable position. Prior CABG. Cardiomegaly with
bilateral pulmonary infiltrates consistent with pulmonary edema.
Bilateral pneumonia cannot be excluded. Low lung volumes with
basilar atelectasis . Small left pleural effusion. No pneumothorax.
IMPRESSION: 1.  Right IJ sheath in stable position.

2. Prior CABG. Cardiomegaly with bilateral pulmonary infiltrates
most consistent with pulmonary edema. Bilateral pneumonia cannot be
excluded. Small left pleural effusion.

3. Low lung volumes with basilar atelectasis.

## 2019-02-11 IMAGING — DX DG CHEST 2V
2 series · 2 of 2 positions shown · non-contrast
Comparison: 07/07/2016.

CLINICAL DATA: CABG.

EXAM:
CHEST  2 VIEW

[chest pa]
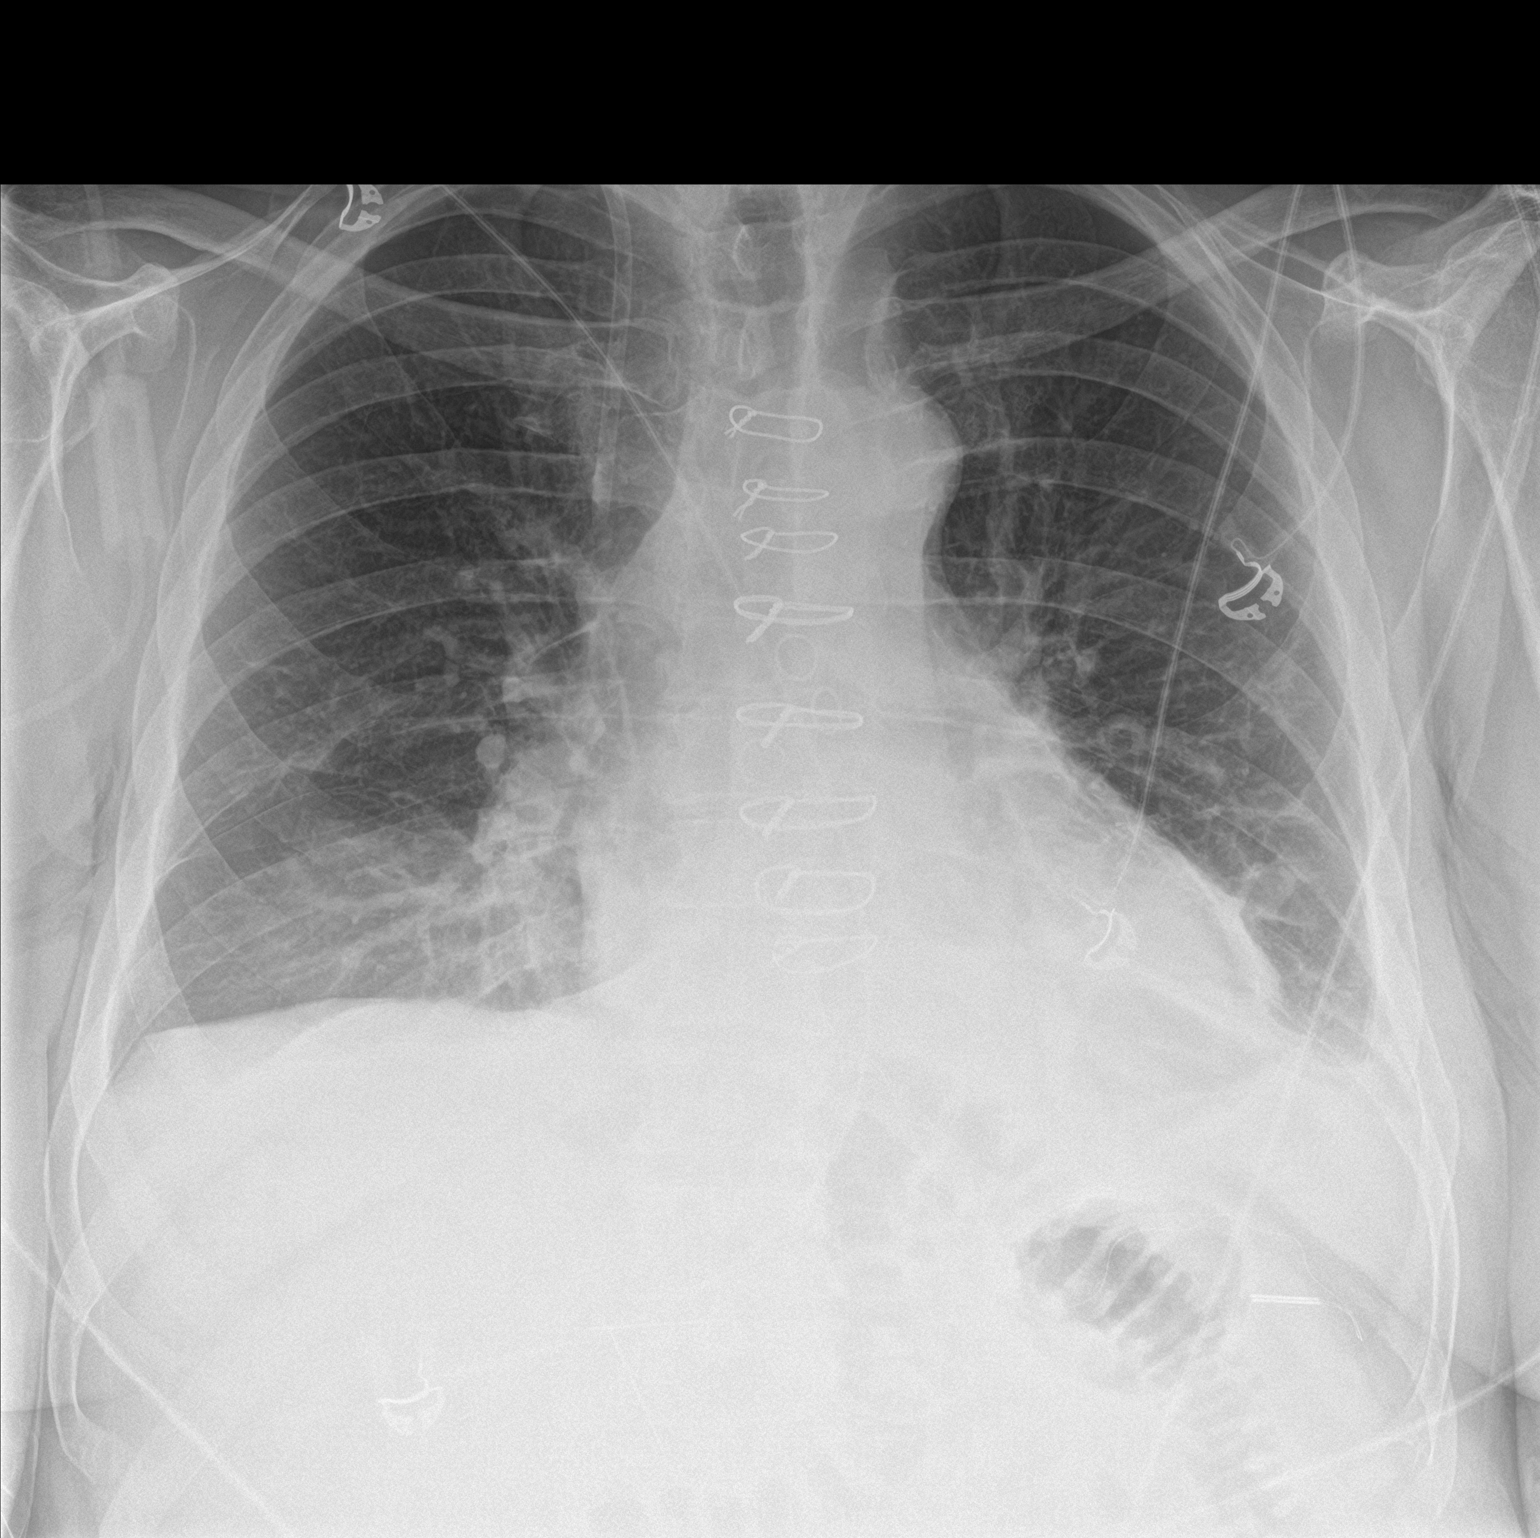

[chest lat]
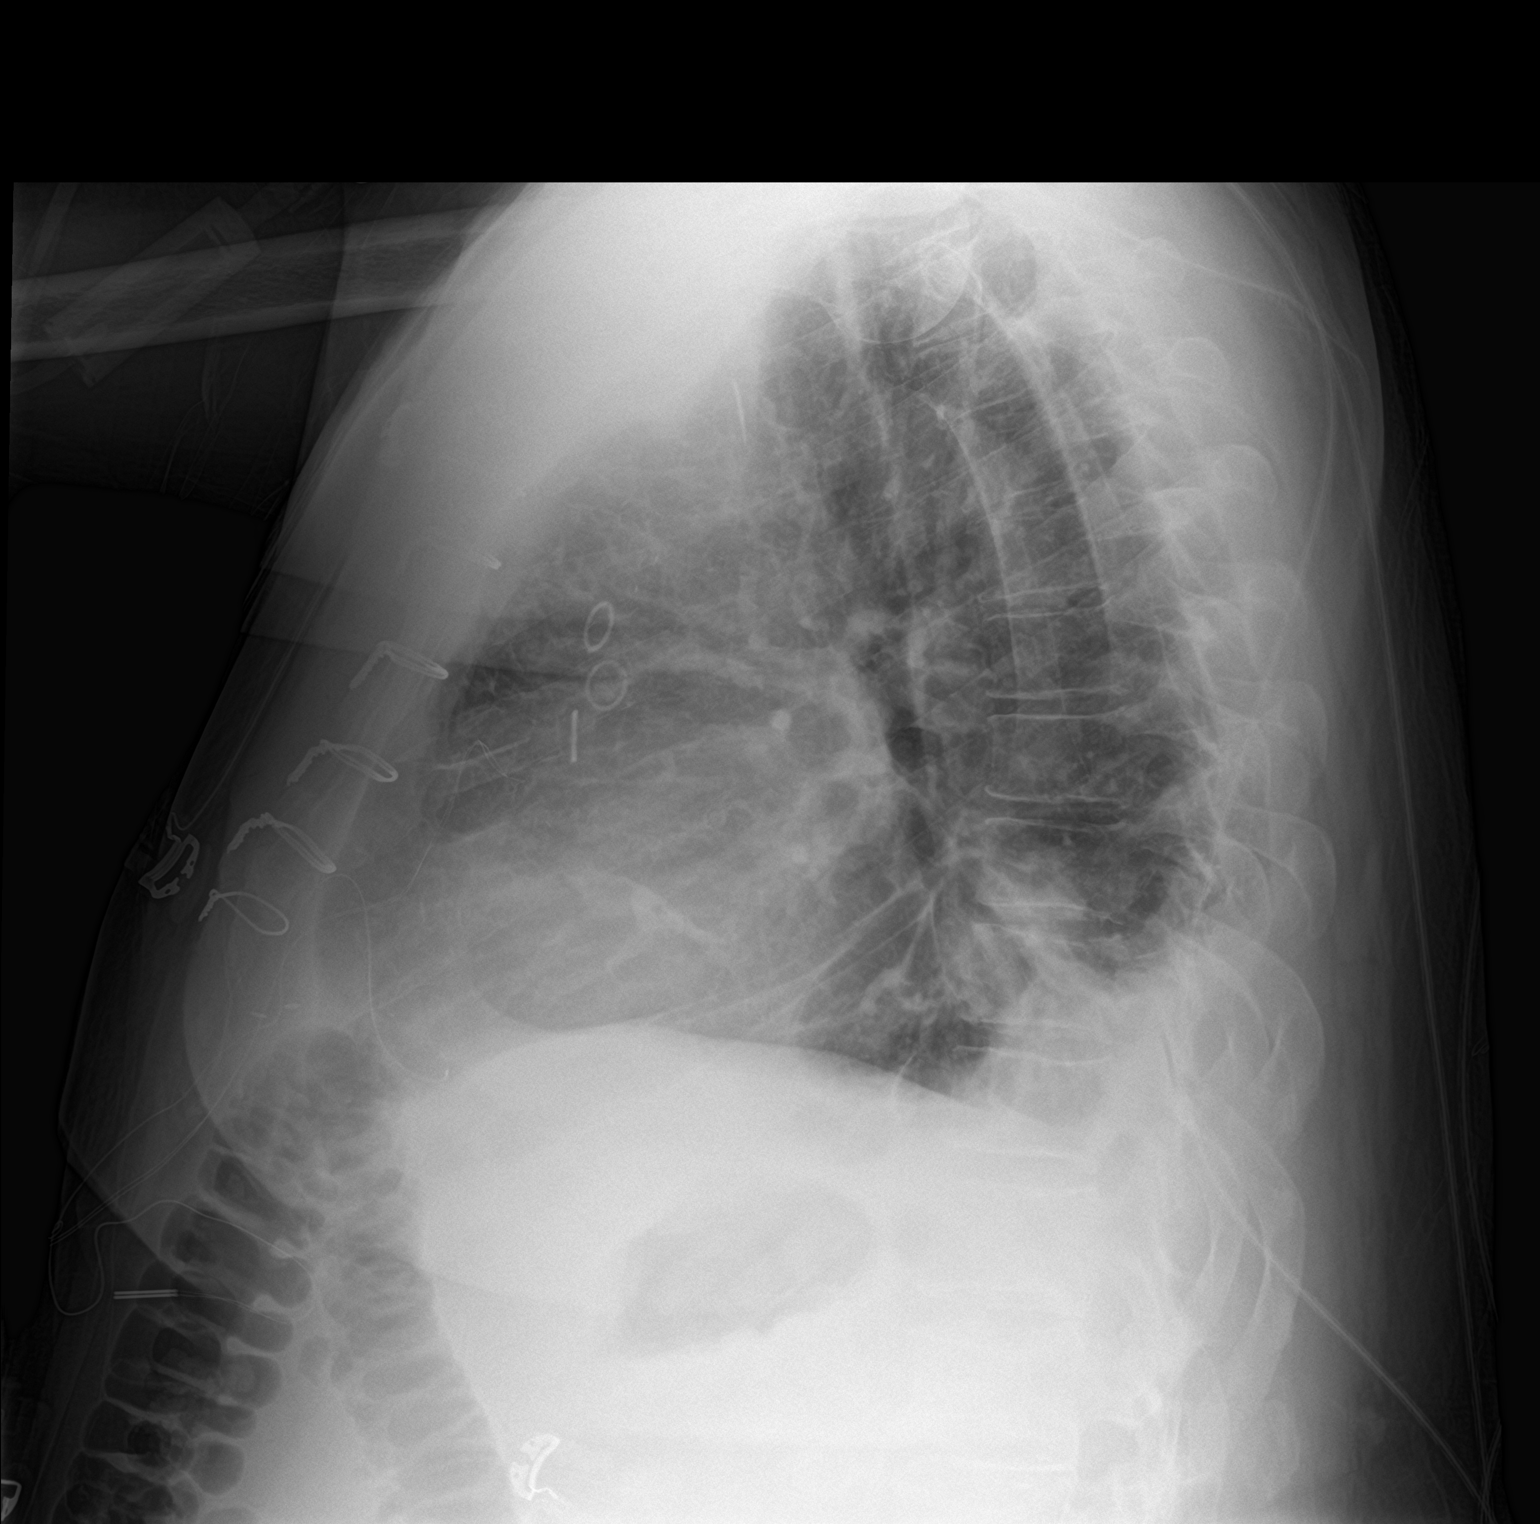

[2 of 2 positions shown; findings below may reference images not displayed]

FINDINGS: Right IJ sheath in stable position. Prior CABG. Cardiomegaly. No
pulmonary venous congestion. Interim partial clearing of basilar
infiltrates/edema. Low lung volumes with mild basilar atelectasis.
No pneumothorax .
IMPRESSION: 1. Right IJ sheath in stable position.

2. Prior CABG. Stable cardiomegaly. Interim partial clearing of
bilateral pulmonary infiltrates/ edema. Small left pleural effusion.

3. Low lung volumes with basilar atelectasis.

## 2019-05-28 ENCOUNTER — Other Ambulatory Visit: Payer: Self-pay | Admitting: Cardiology

## 2019-05-31 ENCOUNTER — Other Ambulatory Visit: Payer: Self-pay | Admitting: *Deleted

## 2019-05-31 MED ORDER — ROSUVASTATIN CALCIUM 20 MG PO TABS: 20.0000 mg | ORAL_TABLET | Freq: Every day | ORAL | 0 refills | Status: DC

## 2019-05-31 NOTE — Telephone Encounter (Signed)
Rx has been sent to the pharmacy electronically. ° °

## 2019-06-21 ENCOUNTER — Other Ambulatory Visit: Payer: Self-pay | Admitting: Cardiology

## 2019-07-29 NOTE — Progress Notes (Signed)
Virtual Visit via Telephone Note   This visit type was conducted due to national recommendations for restrictions regarding the COVID-19 Pandemic (e.g. social distancing) in an effort to limit this patient's exposure and mitigate transmission in our community.  Due to his co-morbid illnesses, this patient is at least at moderate risk for complications without adequate follow up.  This format is felt to be most appropriate for this patient at this time.  The patient did not have access to video technology/had technical difficulties with video requiring transitioning to audio format only (telephone).  All issues noted in this document were discussed and addressed.  No physical exam could be performed with this format.  Please refer to the patient's chart for his  consent to telehealth for [facility].   The patient was identified using 2 identifiers.  Date:  08/01/2019   ID:  Joseph Kidd, DOB [birth year], MRN [id number]  Patient Location: Home Provider Location: Home  PCP:  April Manson, NP  Cardiologist:  Keiran Gaffey [place] MD Electrophysiologist:  None   Evaluation Performed:  Follow-Up Visit  Chief Complaint:  CAD   History of Present Illness:    Joseph Kidd is a 69 y.o. male with PMH of HTN, HLD intolerant to statins, OA and  CAD s/p CABG. He presented to the hospital with inferior STEMI on 07/03/2016. Emergent cardiac catheterization performed on 07/03/16 showed 95% proximal to mid RCA stenosis, 30% ostial to proximal RCA stenosis, 95% proximal LAD, 80% mid LAD, 100% occluded proximal left circumflex with left to left and right-to-left collaterals, 90% ostial OM1 lesion, EF 50-55%. Echocardiogram performed on the same day showed EF 60-65%, mild LVH, dilated aortic root up to 40 mm, mild MR. He underwent 5 vessel CABG with LIMA to LAD, SVG to diagonal, sequential SVG to OM1 and OM 3, SVG to RCA by Dr. Tyrone Sage on 07/04/2013. Post procedure, the hospital course involved volume overload  requiring diuresis and also atrial fibrillation with RVR which converted on IV amiodarone. He was also initiated on Crestor.  In July 28, 2016 he complained of persistent wheezing and beta blocker was discontinued. Lisinopril had been discontinued earlier.  He was maintaining NSR and amiodarone was stopped. He was noted to have right leg swelling. LE venous dopplers demonstrated right peroneal vein DVT. He was started on Xarelto and completed his course of therapy in July. In August 2018 he did undergo left TKR. He was given DVT prophylaxis with Xarelto. In June 2019 he had right TKR.  He did have Covid 19 around Thanksgiving. He wasn't very sick. Did lose sense of smell. He states he is doing very well from a cardiac standpoint. No chest pain, dyspnea, palpitations. Walking regularly and still working. He does note BP running between 135-155 systolic.    The patient does not have symptoms concerning for COVID-19 infection currently (fever, chills, cough, or new shortness of breath).    Past Medical History:  Diagnosis Date  . Coronary artery disease   . DVT (deep venous thrombosis) (HCC)    right lower leg  . Hypercholesterolemia   . Hypertension   . Myocardial infarction (HCC)   . Osteoarthritis   . Osteoarthritis of right knee    Past Surgical History:  Procedure Laterality Date  . CARDIAC CATHETERIZATION    . CORONARY ARTERY BYPASS GRAFT N/A 07/04/2016   Procedure: CORONARY ARTERY BYPASS GRAFT times five with left internal mammary artery to Left Anterior Descending artery : endoscopic harvest of right saphenous  vein with grafts to Diagonal, Right Coronary, Obtuse Marginal 1, Obtuse Marginal 3 Arteries. Insertion of right groin femoral a line;  Surgeon: Delight Ovens, MD;  Location: [facility] OR;  Service: Open Heart Surgery;  Laterality: N/A;  . INTRAOPERATIVE TRANSESOPHAGEAL ECHOCARDIOGRAM N/A 07/04/2016   Procedure: INTRAOPERATIVE TRANSESOPHAGEAL ECHOCARDIOGRAM;  Surgeon: Delight Ovens, MD;  Location[facility];  Service: Open Heart Surgery;  Laterality: N/A;  . LEFT HEART CATH AND CORONARY ANGIOGRAPHY N/A 07/03/2016   Procedure: Left Heart Cath and Coronary Angiography;  Surgeon: Philopateer Strine M [place], MD;  Location: [facility] INVASIVE CV LAB;  Service: Cardiovascular;  Laterality: N/A;  . TOTAL KNEE ARTHROPLASTY Left 11/25/2016   Procedure: LEFT TOTAL KNEE ARTHROPLASTY;  Surgeon: Valeria Batman, MD;  Location: [facility] OR;  Service: Orthopedics;  Laterality: Left;  . TOTAL KNEE ARTHROPLASTY Right 10/13/2017   Procedure: RIGHT TOTAL KNEE ARTHROPLASTY;  Surgeon: Valeria Batman, MD;  Location: [facility] OR;  Service: Orthopedics;  Laterality: Right;  FEMORAL NERVE BLOCK     Current Meds  Medication Sig  . aspirin EC 81 MG tablet Take 81 mg by mouth daily.  Marland Kitchen diltiazem (CARDIZEM CD) 240 MG 24 hr capsule Take 1 capsule (240 mg total) by mouth daily. Please make annual appt with Dr. [place] for refills. [phone]. 1st attempt.  . ezetimibe (ZETIA) 10 MG tablet Take 1 tablet (10 mg total) by mouth daily.  . rosuvastatin (CRESTOR) 20 MG tablet Take 1 tablet (20 mg total) by mouth daily.     Allergies:   Atorvastatin, Oxycodone-acetaminophen, and Chlorhexidine   Social History   Tobacco Use  . Smoking status: Never Smoker  . Smokeless tobacco: Never Used  Substance Use Topics  . Alcohol use: Yes    Comment: very seldom  . Drug use: No     Family Hx: The patient's family history includes Heart disease in his mother; Hypercholesterolemia in his mother.  ROS:   Please see the history of present illness.     All other systems reviewed and are negative.   Prior CV studies:   The following studies were reviewed today:  Cath 07/03/2016 Conclusion     Prox RCA to Mid RCA lesion, 95 %stenosed.  Ost RCA to Prox RCA lesion, 30 %stenosed.  Mid RCA lesion, 40 %stenosed.  Prox LAD lesion, 95 %stenosed.  Mid LAD-2 lesion, 75 %stenosed.  Mid LAD-1 lesion, 80 %stenosed.  Ost 1st  Mrg to 1st Mrg lesion, 90 %stenosed.  Prox Cx to Mid Cx lesion, 100 %stenosed.  The left ventricular systolic function is normal.  LV end diastolic pressure is normal.  The left ventricular ejection fraction is 50-55% by visual estimate.  1. Critical 3 vessel obstructive CAD.  - complex 95% proximal LAD, 75-80% diffuse mid LAD - 90% OM1 - 100% mid LCx with left to left and right to left collaterals - 95% proximal RCA 2. Good LV function 3. Normal LVEDP  Plan: Patient is currently pain free- will aggressively threat medically with ASA, IV Ntg, IV heparin, beta blocker. Consult CT surgery for CABG.      Echo 07/03/2016 LV EF: 60% - 65%  ------------------------------------------------------------------- Indications: Chest pain 786.51.  ------------------------------------------------------------------- History: Risk factors: NSTEMI. Hypertension. Dyslipidemia.  ------------------------------------------------------------------- Study Conclusions  - Left ventricle: The cavity size was normal. Wall thickness was increased in a pattern of mild LVH. Systolic function was normal. The estimated ejection fraction was in the range of 60% to 65%. Wall motion was normal; there were no regional wall  motion abnormalities. Left ventricular diastolic function parameters were normal. - Aortic valve: Mildly calcified annulus. There was trivial regurgitation. - Aorta: Mild aortic root dilatation. Aortic root dimension: 40 mm (ED). - Mitral valve: There was mild regurgitation.    CABG x 5 by Dr. Servando Snare 07/04/2016 PRE-OPERATIVE DIAGNOSIS: 1. S/p NSTEMI 2. Coronary artery disease  POST-OPERATIVE DIAGNOSIS: 1. S/p NSTEMI 2. Coronary artery disease  PROCEDURE: INTRAOPERATIVE TRANSESOPHAGEAL ECHOCARDIOGRAM, MEDIAN STERNOTOMY for CORONARY ARTERY BYPASS GRAFT x 5 (LIMA to LAD, SVG to DIAGONAL, SVG SEQUENTIALLY to OM1 and OM3, and SVG to RCA)  with endoscopic harvest of right greater saphenous vein with grafts to    Labs/Other Tests and Data Reviewed:    EKG:  No ECG reviewed.  Recent Labs: No results found for requested labs within last 8760 hours.   Recent Lipid Panel Lab Results  Component Value Date/Time   CHOL 113 07/12/2018 09:17 AM   TRIG 58 07/12/2018 09:17 AM   HDL 31 (L) 07/12/2018 09:17 AM   CHOLHDL 3.6 07/12/2018 09:17 AM   CHOLHDL 4.3 03/02/2017 09:08 AM   LDLCALC 70 07/12/2018 09:17 AM   LDLCALC 95 03/02/2017 09:08 AM    Wt Readings from Last 3 Encounters:  08/01/19 260 lb (117.9 kg)  05/13/18 242 lb 6.4 oz (110 kg)  11/16/17 225 lb (102.1 kg)     Objective:    Vital Signs:  BP (!) 151/84   Ht 5\' 11"  (1.803 m)   Wt 260 lb (117.9 kg)   BMI 36.26 kg/m    VITAL SIGNS:  reviewed  ASSESSMENT & PLAN:    1. CAD s/p CABG March 2018. He is asymptomatic on ASA and diltiazem. On statin 2. HTN- poorly controlled. Will add losartan 50 mg daily. Check chemistries in 2 weeks. 3. Hypercholesterolemia- on Zetia and statin. Will follow up lipid panel.   COVID-19 Education: The signs and symptoms of COVID-19 were discussed with the patient and how to seek care for testing (follow up with PCP or arrange E-visit).  The importance of social distancing was discussed today.  Time:   Today, I have spent 15 minutes with the patient with telehealth technology discussing the above problems.     Medication Adjustments/Labs and Tests Ordered: Current medicines are reviewed at length with the patient today.  Concerns regarding medicines are outlined above.   Tests Ordered: No orders of the defined types were placed in this encounter.   Medication Changes: Meds ordered this encounter  Medications  . losartan (COZAAR) 50 MG tablet    Sig: Take 1 tablet (50 mg total) by mouth daily.    Dispense:  90 tablet    Refill:  3    Follow Up:  In Person in 1 year(s)  Signed, Shalana Jardin [place], MD  08/01/2019 8:57 AM     [place]

## 2019-08-01 ENCOUNTER — Telehealth (INDEPENDENT_AMBULATORY_CARE_PROVIDER_SITE_OTHER): Payer: Medicare Other | Admitting: Cardiology

## 2019-08-01 ENCOUNTER — Other Ambulatory Visit: Payer: Self-pay

## 2019-08-01 ENCOUNTER — Encounter: Payer: Self-pay | Admitting: Cardiology

## 2019-08-01 ENCOUNTER — Other Ambulatory Visit: Payer: Self-pay | Admitting: Cardiology

## 2019-08-01 VITALS — BP 151/84 | Ht 71.0 in | Wt 260.0 lb

## 2019-08-01 DIAGNOSIS — I25810 Atherosclerosis of coronary artery bypass graft(s) without angina pectoris: Secondary | ICD-10-CM | POA: Diagnosis not present

## 2019-08-01 DIAGNOSIS — I10 Essential (primary) hypertension: Secondary | ICD-10-CM

## 2019-08-01 DIAGNOSIS — E7800 Pure hypercholesterolemia, unspecified: Secondary | ICD-10-CM

## 2019-08-01 DIAGNOSIS — Z951 Presence of aortocoronary bypass graft: Secondary | ICD-10-CM

## 2019-08-01 MED ORDER — LOSARTAN POTASSIUM 50 MG PO TABS: 50.0000 mg | ORAL_TABLET | Freq: Every day | ORAL | 3 refills | Status: DC

## 2019-08-01 NOTE — Addendum Note (Signed)
Addended by: Neoma Laming on: 08/01/2019 10:56 AM   Modules accepted: Orders

## 2019-08-01 NOTE — Patient Instructions (Addendum)
Add losartan 50 mg daily for blood pressure  We will check lab work in 2 weeks.( Mon 4/19 Lab opens 8:00 am to 12:00 noon  No appointment needed No food [place] only )  Follow up in one year

## 2019-08-02 ENCOUNTER — Telehealth: Payer: Self-pay | Admitting: Cardiology

## 2019-08-02 MED ORDER — DILTIAZEM HCL ER COATED BEADS 240 MG PO CP24: 240.0000 mg | ORAL_CAPSULE | Freq: Every day | ORAL | 3 refills | Status: DC

## 2019-08-02 NOTE — Telephone Encounter (Signed)
Spoke with patient. Patient reports he misspoke during his video visit yesterday. He has been taking 50mg  of Losartan daily. Patient reports a new prescription for 50mg  was called in but would like to know if he should actually take 100mg  since he has been taking 50mg . Will forward to Dr. and for review. Patient uses the [facility] in [place].

## 2019-08-02 NOTE — Telephone Encounter (Signed)
Pt c/o medication issue:  1. Name of Medication: losartan (COZAAR) 50 MG tablet  2. How are you currently taking this medication (dosage and times per day)? 1 tablet by mouth daily   3. Are you having a reaction (difficulty breathing--STAT)? No   4. What is your medication issue? Joseph Kidd is calling to inform Joseph Kidd he is currently taking Losartan. He states he had told him otherwise during his virtual appointment yesterday and just wanted to be sure he knew. Joseph Kidd would like Joseph Kidd to call him back in regards to this. Please advise.

## 2019-08-02 NOTE — Telephone Encounter (Signed)
Yes I would increase dose to 100 mg daily. Thanks  Tisheena Maguire [place] MD, [facility]

## 2019-08-02 NOTE — Telephone Encounter (Signed)
Spoke to patient Dr.Jordan's advice given.Diltiazem 90 day refill sent to pharmacy.Stated he did not need a Losartan refill at this time.

## 2019-08-16 LAB — HEPATIC FUNCTION PANEL
ALT: 21 IU/L (ref 0–44)
AST: 19 IU/L (ref 0–40)
Albumin: 4.2 g/dL (ref 3.8–4.8)
Alkaline Phosphatase: 88 IU/L (ref 39–117)
Bilirubin Total: 0.4 mg/dL (ref 0.0–1.2)
Bilirubin, Direct: 0.12 mg/dL (ref 0.00–0.40)
Total Protein: 7.5 g/dL (ref 6.0–8.5)

## 2019-08-16 LAB — BASIC METABOLIC PANEL
BUN/Creatinine Ratio: 18 (ref 10–24)
BUN: 19 mg/dL (ref 8–27)
CO2: 21 mmol/L (ref 20–29)
Calcium: 9 mg/dL (ref 8.6–10.2)
Chloride: 106 mmol/L (ref 96–106)
Creatinine, Ser: 1.05 mg/dL (ref 0.76–1.27)
GFR calc Af Amer: 83 mL/min/{1.73_m2} (ref >59)
GFR calc non Af Amer: 72 mL/min/{1.73_m2} (ref >59)
Glucose: 101 mg/dL — ABNORMAL HIGH (ref 65–99)
Potassium: 3.9 mmol/L (ref 3.5–5.2)
Sodium: 141 mmol/L (ref 134–144)

## 2019-08-16 LAB — LIPID PANEL
Chol/HDL Ratio: 5.3 ratio — ABNORMAL HIGH (ref 0.0–5.0)
Cholesterol, Total: 147 mg/dL (ref 100–199)
HDL: 28 mg/dL — ABNORMAL LOW (ref >39)
LDL Chol Calc (NIH): 103 mg/dL — ABNORMAL HIGH (ref 0–99)
Triglycerides: 80 mg/dL (ref 0–149)
VLDL Cholesterol Cal: 16 mg/dL (ref 5–40)

## 2019-08-19 ENCOUNTER — Other Ambulatory Visit: Payer: Self-pay

## 2019-08-19 DIAGNOSIS — I25810 Atherosclerosis of coronary artery bypass graft(s) without angina pectoris: Secondary | ICD-10-CM

## 2019-08-19 DIAGNOSIS — E7800 Pure hypercholesterolemia, unspecified: Secondary | ICD-10-CM

## 2019-08-19 MED ORDER — EZETIMIBE 10 MG PO TABS: 10.0000 mg | ORAL_TABLET | Freq: Every day | ORAL | 3 refills | Status: DC

## 2019-08-22 ENCOUNTER — Other Ambulatory Visit: Payer: Self-pay | Admitting: Cardiology

## 2019-08-25 ENCOUNTER — Other Ambulatory Visit: Payer: Self-pay | Admitting: Cardiology

## 2019-11-03 ENCOUNTER — Other Ambulatory Visit: Payer: Self-pay | Admitting: Cardiology

## 2019-11-19 ENCOUNTER — Other Ambulatory Visit: Payer: Self-pay | Admitting: Cardiology

## 2019-11-21 DIAGNOSIS — I25810 Atherosclerosis of coronary artery bypass graft(s) without angina pectoris: Secondary | ICD-10-CM | POA: Diagnosis not present

## 2019-11-21 DIAGNOSIS — E7800 Pure hypercholesterolemia, unspecified: Secondary | ICD-10-CM | POA: Diagnosis not present

## 2019-11-22 LAB — HEPATIC FUNCTION PANEL
ALT: 27 IU/L (ref 0–44)
AST: 20 IU/L (ref 0–40)
Albumin: 4.6 g/dL (ref 3.8–4.8)
Alkaline Phosphatase: 83 IU/L (ref 48–121)
Bilirubin Total: 0.4 mg/dL (ref 0.0–1.2)
Bilirubin, Direct: 0.13 mg/dL (ref 0.00–0.40)
Total Protein: 7.6 g/dL (ref 6.0–8.5)

## 2019-11-22 LAB — LIPID PANEL
Chol/HDL Ratio: 4.6 ratio (ref 0.0–5.0)
Cholesterol, Total: 128 mg/dL (ref 100–199)
HDL: 28 mg/dL — ABNORMAL LOW (ref >39)
LDL Chol Calc (NIH): 85 mg/dL (ref 0–99)
Triglycerides: 73 mg/dL (ref 0–149)
VLDL Cholesterol Cal: 15 mg/dL (ref 5–40)

## 2019-11-29 ENCOUNTER — Other Ambulatory Visit: Payer: Self-pay | Admitting: Cardiology

## 2020-05-27 ENCOUNTER — Other Ambulatory Visit: Payer: Self-pay | Admitting: Cardiology

## 2020-08-08 ENCOUNTER — Other Ambulatory Visit: Payer: Self-pay | Admitting: Cardiology

## 2020-08-24 ENCOUNTER — Other Ambulatory Visit: Payer: Self-pay | Admitting: Cardiology

## 2020-09-07 NOTE — Progress Notes (Signed)
Cardiology Office Note    Date:  09/10/2020   ID:  Joseph Kidd, DOB Nov 08, 1950, MRN [id number]  PCP:  April Manson, NP  Cardiologist:  Dr. Elfriede Bonini [place]  Chief Complaint  Patient presents with  . Coronary Artery Disease  . Hypertension    History of Present Illness:  Joseph Kidd is a 70 y.o. male with PMH of HTN, HLD intolerant to statins, OA and  CAD s/p CABG. He presented to the hospital with inferior STEMI on 07/03/2016. Emergent cardiac catheterization performed on 07/03/16 showed 95% proximal to mid RCA stenosis, 30% ostial to proximal RCA stenosis, 95% proximal LAD, 80% mid LAD, 100% occluded proximal left circumflex with left to left and right-to-left collaterals, 90% ostial OM1 lesion, EF 50-55%. Echocardiogram performed on the same day showed EF 60-65%, mild LVH, dilated aortic root up to 40 mm, mild MR. He underwent 5 vessel CABG with LIMA to LAD, SVG to diagonal, sequential SVG to OM1 and OM 3, SVG to RCA by Dr. Tyrone Sage on 07/04/2013. Post procedure, the hospital course involved volume overload requiring diuresis and also atrial fibrillation with RVR which converted on IV amiodarone. He was also initiated on Crestor.  In July 28, 2016 he complained of persistent wheezing and beta blocker was discontinued. Lisinopril had been discontinued earlier.  He was maintaining NSR and amiodarone was stopped. He was noted to have right leg swelling. LE venous dopplers demonstrated right peroneal vein DVT. He was started on Xarelto and completed his course of therapy in July. In August 2018 he did undergo left TKR. He was given DVT prophylaxis with Xarelto. In June 2019 he had right TKR. On his last visit he was started on losartan for BP control.   On follow up he is doing well. He has no chest pain, palpitations, dyspnea or edema. He has gained an additional 12 lbs. . He is not walking much but states he was able to do his normal activity. States he likes to eat and has trouble restricting  carbs.   Past Medical History:  Diagnosis Date  . Coronary artery disease   . DVT (deep venous thrombosis) (HCC)    right lower leg  . Hypercholesterolemia   . Hypertension   . Myocardial infarction (HCC)   . Osteoarthritis   . Osteoarthritis of right knee     Past Surgical History:  Procedure Laterality Date  . CARDIAC CATHETERIZATION    . CORONARY ARTERY BYPASS GRAFT N/A 07/04/2016   Procedure: CORONARY ARTERY BYPASS GRAFT times five with left internal mammary artery to Left Anterior Descending artery : endoscopic harvest of right saphenous vein with grafts to Diagonal, Right Coronary, Obtuse Marginal 1, Obtuse Marginal 3 Arteries. Insertion of right groin femoral a line;  Surgeon: Delight Ovens, MD;  Location: [facility] OR;  Service: Open Heart Surgery;  Laterality: N/A;  . INTRAOPERATIVE TRANSESOPHAGEAL ECHOCARDIOGRAM N/A 07/04/2016   Procedure: INTRAOPERATIVE TRANSESOPHAGEAL ECHOCARDIOGRAM;  Surgeon: Delight Ovens, MD;  Location: [facility] OR;  Service: Open Heart Surgery;  Laterality: N/A;  . LEFT HEART CATH AND CORONARY ANGIOGRAPHY N/A 07/03/2016   Procedure: Left Heart Cath and Coronary Angiography;  Surgeon: Beau Vanduzer M [place], MD;  Location: [facility] INVASIVE CV LAB;  Service: Cardiovascular;  Laterality: N/A;  . TOTAL KNEE ARTHROPLASTY Left 11/25/2016   Procedure: LEFT TOTAL KNEE ARTHROPLASTY;  Surgeon: Valeria Batman, MD;  Location: [facility] OR;  Service: Orthopedics;  Laterality: Left;  . TOTAL KNEE ARTHROPLASTY Right 10/13/2017   Procedure: RIGHT TOTAL  KNEE ARTHROPLASTY;  Surgeon: Valeria Batman, MD;  Location: [facility] OR;  Service: Orthopedics;  Laterality: Right;  FEMORAL NERVE BLOCK    Current Medications: Outpatient Medications Prior to Visit  Medication Sig Dispense Refill  . aspirin EC 81 MG tablet Take 81 mg by mouth daily.    Marland Kitchen diltiazem (CARDIZEM CD) 240 MG 24 hr capsule Take 1 capsule (240 mg total) by mouth daily. Please keep upcoming appt for future refills. 90 capsule 0  . ezetimibe  (ZETIA) 10 MG tablet TAKE 1 TABLET BY MOUTH EVERY DAY 90 tablet 0  . losartan (COZAAR) 100 MG tablet TAKE 1 TABLET BY MOUTH EVERY DAY 90 tablet 1  . rosuvastatin (CRESTOR) 20 MG tablet TAKE 1 TABLET BY MOUTH EVERY DAY 90 tablet 3   No facility-administered medications prior to visit.     Allergies:   Atorvastatin, Oxycodone-acetaminophen, and Chlorhexidine   Social History   Socioeconomic History  . Marital status: Married    Spouse name: Not on file  . Number of children: 1  . Years of education: Not on file  . Highest education level: Not on file  Occupational History  . Occupation: retired Copywriter, advertising  Tobacco Use  . Smoking status: Never Smoker  . Smokeless tobacco: Never Used  Vaping Use  . Vaping Use: Never used  Substance and Sexual Activity  . Alcohol use: Yes    Comment: very seldom  . Drug use: No  . Sexual activity: Not on file  Other Topics Concern  . Not on file  Social History Narrative  . Not on file   Social Determinants of Health   Financial Resource Strain: Not on file  Food Insecurity: Not on file  Transportation Needs: Not on file  Physical Activity: Not on file  Stress: Not on file  Social Connections: Not on file     Family History:  The patient's family history includes Heart disease in his mother; Hypercholesterolemia in his mother.   ROS:   Please see the history of present illness.    ROS All other systems reviewed and are negative.   PHYSICAL EXAM:   VS:  BP (!) 158/98   Pulse 64   Ht 5\' 11"  (1.803 m)   Wt 272 lb 6.4 oz (123.6 kg)   BMI 37.99 kg/m    GENERAL:  Well appearing obese WM in NAD HEENT:  PERRL, EOMI, sclera are clear. Oropharynx is clear. NECK:  No jugular venous distention, carotid upstroke brisk and symmetric, no bruits, no thyromegaly or adenopathy LUNGS:  Clear to auscultation bilaterally CHEST:  Unremarkable HEART:  RRR,  PMI not displaced or sustained,S1 and S2 within normal limits, no S3, no S4: no clicks, no  rubs, no murmurs ABD:  Soft, nontender. BS +, no masses or bruits. No hepatomegaly, no splenomegaly EXT:  2 + pulses throughout, no edema, no cyanosis no clubbing SKIN:  Warm and dry.  No rashes NEURO:  Alert and oriented x 3. Cranial nerves II through XII intact. PSYCH:  Cognitively intact   Wt Readings from Last 3 Encounters:  09/10/20 272 lb 6.4 oz (123.6 kg)  08/01/19 260 lb (117.9 kg)  05/13/18 242 lb 6.4 oz (110 kg)      Studies/Labs Reviewed:   EKG:  EKG is ordered today. NSR with nonspecific TWA. I have personally reviewed and interpreted this study.   Recent Labs: 11/21/2019: ALT 27   Lipid Panel    Component Value Date/Time   CHOL 128 11/21/2019 0810  TRIG 73 11/21/2019 0810   HDL 28 (L) 11/21/2019 0810   CHOLHDL 4.6 11/21/2019 0810   CHOLHDL 4.3 03/02/2017 0908   VLDL 19 08/26/2016 1236   LDLCALC 85 11/21/2019 0810   LDLCALC 95 03/02/2017 0908   Labs dated 05/26/17: CBC and CMET normal. Cholesterol 120, triglycerides 26, HDL 35, LDL 74. TSH and PSA normal.  Dated 10/15/17: chemistries normal. Hgb 11.2  Additional studies/ records that were reviewed today include:   Cath 07/03/2016 Conclusion     Prox RCA to Mid RCA lesion, 95 %stenosed.  Ost RCA to Prox RCA lesion, 30 %stenosed.  Mid RCA lesion, 40 %stenosed.  Prox LAD lesion, 95 %stenosed.  Mid LAD-2 lesion, 75 %stenosed.  Mid LAD-1 lesion, 80 %stenosed.  Ost 1st Mrg to 1st Mrg lesion, 90 %stenosed.  Prox Cx to Mid Cx lesion, 100 %stenosed.  The left ventricular systolic function is normal.  LV end diastolic pressure is normal.  The left ventricular ejection fraction is 50-55% by visual estimate.   1. Critical 3 vessel obstructive CAD.     - complex 95% proximal LAD, 75-80% diffuse mid LAD    - 90% OM1    - 100% mid LCx with left to left and right to left collaterals    - 95% proximal RCA 2. Good LV function 3. Normal LVEDP  Plan: Patient is currently pain free- will aggressively  threat medically with ASA, IV Ntg, IV heparin, beta blocker. Consult CT surgery  for CABG.      Echo 07/03/2016 LV EF: 60% -   65%  ------------------------------------------------------------------- Indications:      Chest pain 786.51.  ------------------------------------------------------------------- History:   Risk factors:  NSTEMI. Hypertension. Dyslipidemia.  ------------------------------------------------------------------- Study Conclusions  - Left ventricle: The cavity size was normal. Wall thickness was   increased in a pattern of mild LVH. Systolic function was normal.   The estimated ejection fraction was in the range of 60% to 65%.   Wall motion was normal; there were no regional wall motion   abnormalities. Left ventricular diastolic function parameters   were normal. - Aortic valve: Mildly calcified annulus. There was trivial   regurgitation. - Aorta: Mild aortic root dilatation. Aortic root dimension: 40 mm   (ED). - Mitral valve: There was mild regurgitation.    CABG x 5 by Dr. Tyrone Sage 07/04/2016 PRE-OPERATIVE DIAGNOSIS:  1. S/p NSTEMI 2. Coronary artery disease  POST-OPERATIVE DIAGNOSIS:  1. S/p NSTEMI 2. Coronary artery disease  PROCEDURE: INTRAOPERATIVE TRANSESOPHAGEAL ECHOCARDIOGRAM, MEDIAN STERNOTOMY for CORONARY ARTERY BYPASS GRAFT x 5 (LIMA to LAD, SVG to DIAGONAL, SVG SEQUENTIALLY to OM1 and OM3, and SVG to RCA) with  endoscopic harvest of right greater saphenous vein with grafts to    ASSESSMENT:    1. Coronary artery disease involving coronary bypass graft of native heart without angina pectoris   2. S/P CABG x 5   3. Essential hypertension   4. Hypercholesterolemia      PLAN:  In order of problems listed above:  1. CAD s/p CABG in March 2018: he is asymptomatic. Continue ASA, Statin, Diltiazem.  2. Post op RLE peroneal DVT: resolved.  3. HTN: on losartan and diltiazem. Poorly controlled. Will add chlorthalidone 25 mg daily. Needs  to focus on weight loss and low sodium diet.   4. HLD: Intolerant to Lipitor as before, he was started on Crestor 5 mg daily, then increased to 20 mg daily. Tolerating well. Now on Zetia. Will update labs today.  5. Severe osteoarthritis  knees. S/p left and right TKR.   6. Morbid obesity. Stressed importance of calorie and carb restriction and increased aerobic activity.     Medication Adjustments/Labs and Tests Ordered: Current medicines are reviewed at length with the patient today.  Concerns regarding medicines are outlined above.  Medication changes, Labs and Tests ordered today are listed in the Patient Instructions below. Patient Instructions  Add chlorthalidone 25 mg daily for blood pressure  Will check lab work today  You need to focus on increased aerobic activity and weight loss focusing on reduced carbohydrates and sugars.    Signed, Dickson Kostelnik [place], MD  09/10/2020 9:45 AM    [facility] [address], [place], [place]  [address] Phone: [phone]; Fax: [phone]

## 2020-09-10 ENCOUNTER — Encounter: Payer: Self-pay | Admitting: Cardiology

## 2020-09-10 ENCOUNTER — Other Ambulatory Visit: Payer: Self-pay

## 2020-09-10 ENCOUNTER — Ambulatory Visit (INDEPENDENT_AMBULATORY_CARE_PROVIDER_SITE_OTHER): Payer: Medicare Other | Admitting: Cardiology

## 2020-09-10 VITALS — BP 158/98 | HR 64 | Ht 71.0 in | Wt 272.4 lb

## 2020-09-10 DIAGNOSIS — Z951 Presence of aortocoronary bypass graft: Secondary | ICD-10-CM | POA: Diagnosis not present

## 2020-09-10 DIAGNOSIS — I25810 Atherosclerosis of coronary artery bypass graft(s) without angina pectoris: Secondary | ICD-10-CM

## 2020-09-10 DIAGNOSIS — E7800 Pure hypercholesterolemia, unspecified: Secondary | ICD-10-CM

## 2020-09-10 DIAGNOSIS — I10 Essential (primary) hypertension: Secondary | ICD-10-CM

## 2020-09-10 LAB — LIPID PANEL
Chol/HDL Ratio: 4.5 ratio (ref 0.0–5.0)
Cholesterol, Total: 131 mg/dL (ref 100–199)
HDL: 29 mg/dL — ABNORMAL LOW (ref >39)
LDL Chol Calc (NIH): 87 mg/dL (ref 0–99)
Triglycerides: 75 mg/dL (ref 0–149)
VLDL Cholesterol Cal: 15 mg/dL (ref 5–40)

## 2020-09-10 LAB — BASIC METABOLIC PANEL
BUN/Creatinine Ratio: 19 (ref 10–24)
BUN: 20 mg/dL (ref 8–27)
CO2: 24 mmol/L (ref 20–29)
Calcium: 9.5 mg/dL (ref 8.6–10.2)
Chloride: 102 mmol/L (ref 96–106)
Creatinine, Ser: 1.03 mg/dL (ref 0.76–1.27)
Glucose: 102 mg/dL — ABNORMAL HIGH (ref 65–99)
Potassium: 5 mmol/L (ref 3.5–5.2)
Sodium: 139 mmol/L (ref 134–144)
eGFR: 78 mL/min/{1.73_m2} (ref >59)

## 2020-09-10 LAB — HEPATIC FUNCTION PANEL
ALT: 27 IU/L (ref 0–44)
AST: 21 IU/L (ref 0–40)
Albumin: 4.4 g/dL (ref 3.7–4.7)
Alkaline Phosphatase: 90 IU/L (ref 44–121)
Bilirubin Total: 0.5 mg/dL (ref 0.0–1.2)
Bilirubin, Direct: 0.13 mg/dL (ref 0.00–0.40)
Total Protein: 7.4 g/dL (ref 6.0–8.5)

## 2020-09-10 MED ORDER — CHLORTHALIDONE 25 MG PO TABS
25.0000 mg | ORAL_TABLET | Freq: Every day | ORAL | 3 refills | Status: DC
Start: 2020-09-10 — End: 2020-10-15

## 2020-09-10 NOTE — Addendum Note (Signed)
Addended by: Neoma Laming on: 09/10/2020 09:49 AM   Modules accepted: Orders

## 2020-09-10 NOTE — Patient Instructions (Signed)
Add chlorthalidone 25 mg daily for blood pressure  Will check lab work today  You need to focus on increased aerobic activity and weight loss focusing on reduced carbohydrates and sugars.

## 2020-09-12 ENCOUNTER — Other Ambulatory Visit: Payer: Self-pay

## 2020-09-12 DIAGNOSIS — E7800 Pure hypercholesterolemia, unspecified: Secondary | ICD-10-CM

## 2020-09-12 DIAGNOSIS — I25810 Atherosclerosis of coronary artery bypass graft(s) without angina pectoris: Secondary | ICD-10-CM

## 2020-09-20 ENCOUNTER — Encounter: Payer: Self-pay | Admitting: *Deleted

## 2020-10-10 ENCOUNTER — Telehealth: Payer: Self-pay | Admitting: Cardiology

## 2020-10-10 NOTE — Telephone Encounter (Signed)
Returned call to patient-reports starting chlorthalidone at last OV 5/16 with Dr. [place].   Reports he took 25 mg x 1 week and it completely "washed him out".  He states he was unable to work and was lightheaded.   After 1 week, he decreased to 12.5 mg (which he is still taking) but continues to feel bad and "washed out".     AM meds: losartan, diltiazem PM: chlorthalidone  Reports when he gets up in the morning his BP can be 180s/85-90 before his AM meds.   Reports later in the day, BP can be down to 110s/50-60s.       Advised would route to MD for review and further recommendations.  Patient aware.

## 2020-10-10 NOTE — Telephone Encounter (Signed)
  Pt states that he can no longer take the blood pressure medicine chlorthalidone (HYGROTON) 25 MG tablet and would like to speak with the nurse Elnita Maxwell, please advise

## 2020-10-11 NOTE — Telephone Encounter (Signed)
Called patient left message on personal voice mail to call back. 

## 2020-10-11 NOTE — Telephone Encounter (Signed)
Spoke to patient advised Dr.Jordan is out of office this week.Stated he wanted him to know he cannot take Chlorthalidone 25 mg.He tried taking 1/2 tablet and cannot take either.Stated makes him feel washed out,no energy.B/P readings listed below.Advised I will call back after Dr.Jordan reviews message.

## 2020-10-14 NOTE — Telephone Encounter (Signed)
I would stop chlorthalidone and start Cardura 2 mg at bedtime. This hopefully will blunt high reading in am  Joseph Kidd Knee [place] MD, [facility]

## 2020-10-15 ENCOUNTER — Other Ambulatory Visit: Payer: Self-pay | Admitting: Cardiology

## 2020-10-15 MED ORDER — DOXAZOSIN MESYLATE 2 MG PO TABS: ORAL_TABLET | ORAL | 3 refills | Status: DC

## 2020-10-15 NOTE — Telephone Encounter (Signed)
Spoke to patient Dr.Jordan's advice given.Advised to call back if B/P does not improve. 

## 2020-10-15 NOTE — Addendum Note (Signed)
Addended by: Neoma Laming on: 10/15/2020 03:48 PM   Modules accepted: Orders

## 2020-11-02 ENCOUNTER — Other Ambulatory Visit: Payer: Self-pay | Admitting: Cardiology

## 2020-11-18 ENCOUNTER — Other Ambulatory Visit: Payer: Self-pay | Admitting: Cardiology

## 2020-11-22 ENCOUNTER — Other Ambulatory Visit: Payer: Self-pay | Admitting: Cardiology

## 2021-01-30 ENCOUNTER — Other Ambulatory Visit: Payer: Self-pay | Admitting: Cardiology

## 2021-02-18 ENCOUNTER — Other Ambulatory Visit: Payer: Self-pay | Admitting: Cardiology

## 2021-05-14 ENCOUNTER — Other Ambulatory Visit: Payer: Self-pay | Admitting: Cardiology

## 2021-05-18 ENCOUNTER — Other Ambulatory Visit: Payer: Self-pay | Admitting: Cardiology

## 2021-07-30 ENCOUNTER — Other Ambulatory Visit: Payer: Self-pay | Admitting: Cardiology

## 2021-08-12 ENCOUNTER — Other Ambulatory Visit: Payer: Self-pay | Admitting: Cardiology

## 2021-08-16 NOTE — Progress Notes (Signed)
? ?Cardiology Office Note   ? ?Date:  08/21/2021  ? ?ID:  Joseph Kidd, DOB October 19, 1950, MRN [id number] ? ?PCP:  [name], NP  ?Cardiologist:  Dr. Renesha Lizama [place] ? ?Chief Complaint  ?Patient presents with  ? Atrial Flutter  ? Coronary Artery Disease  ? ? ?History of Present Illness:  ?Joseph Kidd is a 71 y.o. male with PMH of HTN, HLD intolerant to statins, OA and  CAD s/p CABG. He presented to the hospital with inferior STEMI on 07/03/2016. Emergent cardiac catheterization performed on 07/03/16 showed 95% proximal to mid RCA stenosis, 30% ostial to proximal RCA stenosis, 95% proximal LAD, 80% mid LAD, 100% occluded proximal left circumflex with left to left and right-to-left collaterals, 90% ostial OM1 lesion, EF 50-55%. Echocardiogram performed on the same day showed EF 60-65%, mild LVH, dilated aortic root up to 40 mm, mild MR. He underwent 5 vessel CABG with LIMA to LAD, SVG to diagonal, sequential SVG to OM1 and OM 3, SVG to RCA by Dr. Servando Snare on 07/04/2013. Post procedure, the hospital course involved volume overload requiring diuresis and also atrial fibrillation with RVR which converted on IV amiodarone. He was also initiated on Crestor. ? ?In July 28, 2016 he complained of persistent wheezing and beta blocker was discontinued. Lisinopril had been discontinued earlier.  He was maintaining NSR and amiodarone was stopped. He was noted to have right leg swelling. LE venous dopplers demonstrated right peroneal vein DVT. He was started on Xarelto and completed his course of therapy in July. In August 2018 he did undergo left TKR. He was given DVT prophylaxis with Xarelto. In June 2019 he had right TKR. On his last visit he was started on losartan for BP control. ? ?On follow up he is doing well. He denies any chest pain, palpitations, dyspnea or edema. No dizziness. Is active at work.  ? ?Past Medical History:  ?Diagnosis Date  ? Coronary artery disease   ? DVT (deep venous thrombosis) ([place])   ? right lower  leg  ? Hypercholesterolemia   ? Hypertension   ? Myocardial infarction [facility])   ? Osteoarthritis   ? Osteoarthritis of right knee   ? ? ?Past Surgical History:  ?Procedure Laterality Date  ? CARDIAC CATHETERIZATION    ? CORONARY ARTERY BYPASS GRAFT N/A 07/04/2016  ? Procedure: CORONARY ARTERY BYPASS GRAFT times five with left internal mammary artery to Left Anterior Descending artery : endoscopic harvest of right saphenous vein with grafts to Diagonal, Right Coronary, Obtuse Marginal 1, Obtuse Marginal 3 Arteries. Insertion of right groin femoral a line;  Surgeon: Grace Isaac, MD;  Location: [place];  Service: Open Heart Surgery;  Laterality: N/A;  ? INTRAOPERATIVE TRANSESOPHAGEAL ECHOCARDIOGRAM N/A 07/04/2016  ? Procedure: INTRAOPERATIVE TRANSESOPHAGEAL ECHOCARDIOGRAM;  Surgeon: Grace Isaac, MD;  Location: [place];  Service: Open Heart Surgery;  Laterality: N/A;  ? LEFT HEART CATH AND CORONARY ANGIOGRAPHY N/A 07/03/2016  ? Procedure: Left Heart Cath and Coronary Angiography;  Surgeon: Jeania Nater M Martinique, MD;  Location: [place] CV LAB;  Service: Cardiovascular;  Laterality: N/A;  ? TOTAL KNEE ARTHROPLASTY Left 11/25/2016  ? Procedure: LEFT TOTAL KNEE ARTHROPLASTY;  Surgeon: Garald Balding, MD;  Location: [place];  Service: Orthopedics;  Laterality: Left;  ? TOTAL KNEE ARTHROPLASTY Right 10/13/2017  ? Procedure: RIGHT TOTAL KNEE ARTHROPLASTY;  Surgeon: Garald Balding, MD;  Location: [place];  Service: Orthopedics;  Laterality: Right;  FEMORAL NERVE BLOCK  ? ? ?Current Medications: ?  Outpatient Medications Prior to Visit  ?Medication Sig Dispense Refill  ? diltiazem (CARDIZEM CD) 240 MG 24 hr capsule TAKE 1 CAPSULE BY MOUTH DAILY. PLEASE KEEP UPCOMING APPT FOR FUTURE REFILLS. 90 capsule 0  ? doxazosin (CARDURA) 2 MG tablet Take 2 mg at bedtime 90 tablet 3  ? ezetimibe (ZETIA) 10 MG tablet TAKE 1 TABLET BY MOUTH EVERY DAY 90 tablet 1  ? losartan (COZAAR) 100 MG tablet TAKE 1 TABLET BY MOUTH EVERY DAY. PLEASE SCHEDULE  AN APPOINTMENT WITH DR Martinique 2ND ATTEMPT. 30 tablet 0  ? rosuvastatin (CRESTOR) 20 MG tablet TAKE 1 TABLET BY MOUTH EVERY DAY 90 tablet 3  ? aspirin EC 81 MG tablet Take 81 mg by mouth daily.    ? ?No facility-administered medications prior to visit.  ?  ? ?Allergies:   Atorvastatin, Oxycodone-acetaminophen, and Chlorhexidine  ? ?Social History  ? ?Socioeconomic History  ? Marital status: Married  ?  Spouse name: Not on file  ? Number of children: 1  ? Years of education: Not on file  ? Highest education level: Not on file  ?Occupational History  ? Occupation: retired Clinical cytogeneticist  ?Tobacco Use  ? Smoking status: Never  ? Smokeless tobacco: Never  ?Vaping Use  ? Vaping Use: Never used  ?Substance and Sexual Activity  ? Alcohol use: Yes  ?  Comment: very seldom  ? Drug use: No  ? Sexual activity: Not on file  ?Other Topics Concern  ? Not on file  ?Social History Narrative  ? Not on file  ? ?Social Determinants of Health  ? ?Financial Resource Strain: Not on file  ?Food Insecurity: Not on file  ?Transportation Needs: Not on file  ?Physical Activity: Not on file  ?Stress: Not on file  ?Social Connections: Not on file  ?  ? ?Family History:  The patient's family history includes Heart disease in his mother; Hypercholesterolemia in his mother.  ? ?ROS:   ?Please see the history of present illness.    ?ROS All other systems reviewed and are negative. ? ? ?PHYSICAL EXAM:   ?VS:  BP (!) 162/102   Pulse (!) 111   Ht 5\' 11"  (1.803 m)   Wt 276 lb 6.4 oz (125.4 kg)   SpO2 96%   BMI 38.55 kg/m?    ?GENERAL:  Well appearing obese WM in NAD ?HEENT:  PERRL, EOMI, sclera are clear. Oropharynx is clear. ?NECK:  No jugular venous distention, carotid upstroke brisk and symmetric, no bruits, no thyromegaly or adenopathy ?LUNGS:  Clear to auscultation bilaterally ?CHEST:  Unremarkable ?HEART:  tachy RRR,  PMI not displaced or sustained,S1 and S2 within normal limits, no S3, no S4: no clicks, no rubs, no murmurs ?ABD:  Soft, nontender.  BS +, no masses or bruits. No hepatomegaly, no splenomegaly ?EXT:  2 + pulses throughout, tr edema, no cyanosis no clubbing ?SKIN:  Warm and dry.  No rashes ?NEURO:  Alert and oriented x 3. Cranial nerves II through XII intact. ?PSYCH:  Cognitively intact ? ? ?Wt Readings from Last 3 Encounters:  ?08/21/21 276 lb 6.4 oz (125.4 kg)  ?09/10/20 272 lb 6.4 oz (123.6 kg)  ?08/01/19 260 lb (117.9 kg)  ?  ? ? ?Studies/Labs Reviewed:  ? ?EKG:  EKG is ordered today. Atrial flutter with RVR rate 111. ST-T changes c/w inferolateral ischemia.  I have personally reviewed and interpreted this study. ? ? ?Recent Labs: ?09/10/2020: ALT 27; BUN 20; Creatinine, Ser 1.03; Potassium 5.0; Sodium 139  ? ?  Lipid Panel ?   ?Component Value Date/Time  ? CHOL 131 09/10/2020 1036  ? TRIG 75 09/10/2020 1036  ? HDL 29 (L) 09/10/2020 1036  ? CHOLHDL 4.5 09/10/2020 1036  ? CHOLHDL 4.3 03/02/2017 0908  ? VLDL 19 08/26/2016 1236  ? [place] 87 09/10/2020 1036  ? [place] 95 03/02/2017 0908  ? ?Labs dated 05/26/17: CBC and CMET normal. Cholesterol 120, triglycerides 26, HDL 35, LDL 74. TSH and PSA normal.  ?Dated 10/15/17: chemistries normal. Hgb 11.2 ? ?Additional studies/ records that were reviewed today include:  ? ?Cath 07/03/2016 ?Conclusion  ? ?  ?Prox RCA to Mid RCA lesion, 95 %stenosed. ?Ost RCA to Prox RCA lesion, 30 %stenosed. ?Mid RCA lesion, 40 %stenosed. ?Prox LAD lesion, 95 %stenosed. ?Mid LAD-2 lesion, 75 %stenosed. ?Mid LAD-1 lesion, 80 %stenosed. ?Ost 1st Mrg to 1st Mrg lesion, 90 %stenosed. ?Prox Cx to Mid Cx lesion, 100 %stenosed. ?The left ventricular systolic function is normal. ?LV end diastolic pressure is normal. ?The left ventricular ejection fraction is 50-55% by visual estimate. ?  ?1. Critical 3 vessel obstructive CAD.  ?   - complex 95% proximal LAD, 75-80% diffuse mid LAD ?   - 90% OM1 ?   - 100% mid LCx with left to left and right to left collaterals ?   - 95% proximal RCA ?2. Good LV function ?3. Normal LVEDP ?  ?Plan:  Patient is currently pain free- will aggressively threat medically with ASA, IV Ntg, IV heparin, beta blocker. Consult CT surgery  for CABG.  ?   ? ? ?Echo 07/03/2016 ?LV EF: 60% -   65% ?  ?-----------------------

## 2021-08-16 NOTE — H&P (View-Only) (Signed)
Cardiology Office Note    Date:  08/21/2021   ID:  Joseph Kidd, DOB [birth year], MRN [id number]  PCP:  [name], NP  Cardiologist:  Dr. Domingue Coltrain Martinique  Chief Complaint  Patient presents with   Atrial Flutter   Coronary Artery Disease    History of Present Illness:  Joseph Kidd is a 71 y.o. male with PMH of HTN, HLD intolerant to statins, OA and  CAD s/p CABG. He presented to the hospital with inferior STEMI on 07/03/2016. Emergent cardiac catheterization performed on 07/03/16 showed 95% proximal to mid RCA stenosis, 30% ostial to proximal RCA stenosis, 95% proximal LAD, 80% mid LAD, 100% occluded proximal left circumflex with left to left and right-to-left collaterals, 90% ostial OM1 lesion, EF 50-55%. Echocardiogram performed on the same day showed EF 60-65%, mild LVH, dilated aortic root up to 40 mm, mild MR. He underwent 5 vessel CABG with LIMA to LAD, SVG to diagonal, sequential SVG to OM1 and OM 3, SVG to RCA by Dr. Servando Snare on 07/04/2013. Post procedure, the hospital course involved volume overload requiring diuresis and also atrial fibrillation with RVR which converted on IV amiodarone. He was also initiated on Crestor.  In July 28, 2016 he complained of persistent wheezing and beta blocker was discontinued. Lisinopril had been discontinued earlier.  He was maintaining NSR and amiodarone was stopped. He was noted to have right leg swelling. LE venous dopplers demonstrated right peroneal vein DVT. He was started on Xarelto and completed his course of therapy in July. In August 2018 he did undergo left TKR. He was given DVT prophylaxis with Xarelto. In June 2019 he had right TKR. On his last visit he was started on losartan for BP control.  On follow up he is doing well. He denies any chest pain, palpitations, dyspnea or edema. No dizziness. Is active at work.   Past Medical History:  Diagnosis Date   Coronary artery disease    DVT (deep venous thrombosis) (HCC)    right lower  leg   Hypercholesterolemia    Hypertension    Myocardial infarction (HCC)    Osteoarthritis    Osteoarthritis of right knee     Past Surgical History:  Procedure Laterality Date   CARDIAC CATHETERIZATION     CORONARY ARTERY BYPASS GRAFT N/A 07/04/2016   Procedure: CORONARY ARTERY BYPASS GRAFT times five with left internal mammary artery to Left Anterior Descending artery : endoscopic harvest of right saphenous vein with grafts to Diagonal, Right Coronary, Obtuse Marginal 1, Obtuse Marginal 3 Arteries. Insertion of right groin femoral a line;  Surgeon: Grace Isaac, MD;  Location: [facility];  Service: Open Heart Surgery;  Laterality: N/A;   INTRAOPERATIVE TRANSESOPHAGEAL ECHOCARDIOGRAM N/A 07/04/2016   Procedure: INTRAOPERATIVE TRANSESOPHAGEAL ECHOCARDIOGRAM;  Surgeon: Grace Isaac, MD;  Location: [facility];  Service: Open Heart Surgery;  Laterality: N/A;   LEFT HEART CATH AND CORONARY ANGIOGRAPHY N/A 07/03/2016   Procedure: Left Heart Cath and Coronary Angiography;  Surgeon: Cyan Clippinger M Martinique, MD;  Location: [facility];  Service: Cardiovascular;  Laterality: N/A;   TOTAL KNEE ARTHROPLASTY Left 11/25/2016   Procedure: LEFT TOTAL KNEE ARTHROPLASTY;  Surgeon: Garald Balding, MD;  Location: [facility];  Service: Orthopedics;  Laterality: Left;   TOTAL KNEE ARTHROPLASTY Right 10/13/2017   Procedure: RIGHT TOTAL KNEE ARTHROPLASTY;  Surgeon: Garald Balding, MD;  Location: [facility];  Service: Orthopedics;  Laterality: Right;  FEMORAL NERVE BLOCK    Current Medications:  Outpatient Medications Prior to Visit  Medication Sig Dispense Refill   diltiazem (CARDIZEM CD) 240 MG 24 hr capsule TAKE 1 CAPSULE BY MOUTH DAILY. PLEASE KEEP UPCOMING APPT FOR FUTURE REFILLS. 90 capsule 0   doxazosin (CARDURA) 2 MG tablet Take 2 mg at bedtime 90 tablet 3   ezetimibe (ZETIA) 10 MG tablet TAKE 1 TABLET BY MOUTH EVERY DAY 90 tablet 1   losartan (COZAAR) 100 MG tablet TAKE 1 TABLET BY MOUTH EVERY DAY. PLEASE SCHEDULE  AN APPOINTMENT WITH DR Sw[place]JORDAN 2ND ATTEMPT. 30 tablet 0   rosuvastatin (CRESTOR) 20 MG tablet TAKE 1 TABLET BY MOUTH EVERY DAY 90 tablet 3   aspirin EC 81 MG tablet Take 81 mg by mouth daily.     No facility-administered medications prior to visit.     Allergies:   Atorvastatin, Oxycodone-acetaminophen, and Chlorhexidine   Social History   Socioeconomic History   Marital status: Married    Spouse name: Not on file   Number of children: 1   Years of education: Not on file   Highest education level: Not on file  Occupational History   Occupation: retired Copywriter, advertisinglineman  Tobacco Use   Smoking status: Never   Smokeless tobacco: Never  Vaping Use   Vaping Use: Never used  Substance and Sexual Activity   Alcohol use: Yes    Comment: very seldom   Drug use: No   Sexual activity: Not on file  Other Topics Concern   Not on file  Social History Narrative   Not on file   Social Determinants of Health   Financial Resource Strain: Not on file  Food Insecurity: Not on file  Transportation Needs: Not on file  Physical Activity: Not on file  Stress: Not on file  Social Connections: Not on file     Family History:  The patient's family history includes Heart disease in his mother; Hypercholesterolemia in his mother.   ROS:   Please see the history of present illness.    ROS All other systems reviewed and are negative.   PHYSICAL EXAM:   VS:  BP (!) 162/102   Pulse (!) 111   Ht 5\' 11"  (1.803 m)   Wt 276 lb 6.4 oz (125.4 kg)   SpO2 96%   BMI 38.55 kg/m    GENERAL:  Well appearing obese WM in NAD HEENT:  PERRL, EOMI, sclera are clear. Oropharynx is clear. NECK:  No jugular venous distention, carotid upstroke brisk and symmetric, no bruits, no thyromegaly or adenopathy LUNGS:  Clear to auscultation bilaterally CHEST:  Unremarkable HEART:  tachy RRR,  PMI not displaced or sustained,S1 and S2 within normal limits, no S3, no S4: no clicks, no rubs, no murmurs ABD:  Soft, nontender.  BS +, no masses or bruits. No hepatomegaly, no splenomegaly EXT:  2 + pulses throughout, tr edema, no cyanosis no clubbing SKIN:  Warm and dry.  No rashes NEURO:  Alert and oriented x 3. Cranial nerves II through XII intact. PSYCH:  Cognitively intact   Wt Readings from Last 3 Encounters:  08/21/21 276 lb 6.4 oz (125.4 kg)  09/10/20 272 lb 6.4 oz (123.6 kg)  08/01/19 260 lb (117.9 kg)      Studies/Labs Reviewed:   EKG:  EKG is ordered today. Atrial flutter with RVR rate 111. ST-T changes c/w inferolateral ischemia.  I have personally reviewed and interpreted this study.   Recent Labs: 09/10/2020: ALT 27; BUN 20; Creatinine, Ser 1.03; Potassium 5.0; Sodium 139  Lipid Panel    Component Value Date/Time   CHOL 131 09/10/2020 1036   TRIG 75 09/10/2020 1036   HDL 29 (L) 09/10/2020 1036   CHOLHDL 4.5 09/10/2020 1036   CHOLHDL 4.3 03/02/2017 0908   VLDL 19 08/26/2016 1236   LDLCALC 87 09/10/2020 1036   LDLCALC 95 03/02/2017 0908   Labs dated 05/26/17: CBC and CMET normal. Cholesterol 120, triglycerides 26, HDL 35, LDL 74. TSH and PSA normal.  Dated 10/15/17: chemistries normal. Hgb 11.2  Additional studies/ records that were reviewed today include:   Cath 07/03/2016 Conclusion     Prox RCA to Mid RCA lesion, 95 %stenosed. Ost RCA to Prox RCA lesion, 30 %stenosed. Mid RCA lesion, 40 %stenosed. Prox LAD lesion, 95 %stenosed. Mid LAD-2 lesion, 75 %stenosed. Mid LAD-1 lesion, 80 %stenosed. Ost 1st Mrg to 1st Mrg lesion, 90 %stenosed. Prox Cx to Mid Cx lesion, 100 %stenosed. The left ventricular systolic function is normal. LV end diastolic pressure is normal. The left ventricular ejection fraction is 50-55% by visual estimate.   1. Critical 3 vessel obstructive CAD.     - complex 95% proximal LAD, 75-80% diffuse mid LAD    - 90% OM1    - 100% mid LCx with left to left and right to left collaterals    - 95% proximal RCA 2. Good LV function 3. Normal LVEDP   Plan:  Patient is currently pain free- will aggressively threat medically with ASA, IV Ntg, IV heparin, beta blocker. Consult CT surgery  for CABG.       Echo 07/03/2016 LV EF: 60% -   65%   ------------------------------------------------------------------- Indications:      Chest pain 786.51.   ------------------------------------------------------------------- History:   Risk factors:  NSTEMI. Hypertension. Dyslipidemia.   ------------------------------------------------------------------- Study Conclusions   - Left ventricle: The cavity size was normal. Wall thickness was   increased in a pattern of mild LVH. Systolic function was normal.   The estimated ejection fraction was in the range of 60% to 65%.   Wall motion was normal; there were no regional wall motion   abnormalities. Left ventricular diastolic function parameters   were normal. - Aortic valve: Mildly calcified annulus. There was trivial   regurgitation. - Aorta: Mild aortic root dilatation. Aortic root dimension: 40 mm   (ED). - Mitral valve: There was mild regurgitation.    CABG x 5 by Dr. Servando Snare 07/04/2016 PRE-OPERATIVE DIAGNOSIS:  1. S/p NSTEMI 2. Coronary artery disease   POST-OPERATIVE DIAGNOSIS:  1. S/p NSTEMI 2. Coronary artery disease   PROCEDURE: INTRAOPERATIVE TRANSESOPHAGEAL ECHOCARDIOGRAM, MEDIAN STERNOTOMY for CORONARY ARTERY BYPASS GRAFT x 5 (LIMA to LAD, SVG to DIAGONAL, SVG SEQUENTIALLY to OM1 and OM3, and SVG to RCA) with  endoscopic harvest of right greater saphenous vein with grafts to    ASSESSMENT:    1. Typical atrial flutter ([place])   2. Coronary artery disease involving coronary bypass graft of native heart without angina pectoris   3. S/P CABG x 5   4. Essential hypertension   5. Hypercholesterolemia      PLAN:  In order of problems listed above:  1. CAD s/p CABG in 2015: he is asymptomatic. Continue  Statin, Diltiazem.  2. Post op RLE peroneal DVT: resolved.  3. HTN: on losartan,   diltiazem, Cardura. Poorly controlled. Needs to focus on weight loss and low sodium diet. Will add bisoprolol 5 mg daily. ( History of wheezing on metoprolol )  4. HLD: Intolerant to Lipitor as before,  he was started on Crestor 5 mg daily, then increased to 20 mg daily. Tolerating well. Now on Zetia. Will update labs today.  5. Severe osteoarthritis knees. S/p left and right TKR.   6. Morbid obesity. Stressed importance of calorie and carb restriction and increased aerobic activity.   7. Atrial flutter - new onset from last year. No symptoms. Unclear duration. Rate elevated. Will add bisoprolol to Diltiazem. Check labs today. Arrange Echo. [place] Vasc score is 3. Recommend anticoagulation. Stop ASA. Has been on Xarelto in the past. Will start at 20 mg daily. Return in one month. If still in flutter will arrange a DCCV. He really doesn't want to take anticoagulation. Will need to take a minimum of 4 weeks post cardioversion. If maintaining NSR then could come off blood thinners but if he has recurrence would argue that he should take long term.    Medication Adjustments/Labs and Tests Ordered: Current medicines are reviewed at length with the patient today.  Concerns regarding medicines are outlined above.  Medication changes, Labs and Tests ordered today are listed in the Patient Instructions below. Patient Instructions  Stop ASA  Start Xarelto 20 mg daily  Start bisoprolol 5 mg daily to help control your heart rate.   Continue your other therapy  We will check lab work today and schedule you for an Echocardiogram  Follow up in one month.    Signed, Mareon Robinette Martinique, MD  08/21/2021 11:57 AM    [facility] [place], [place], [place]  [address] Phone: [phone]; Fax: [phone]

## 2021-08-21 ENCOUNTER — Encounter: Payer: Self-pay | Admitting: Cardiology

## 2021-08-21 ENCOUNTER — Ambulatory Visit: Payer: Medicare Other | Admitting: Cardiology

## 2021-08-21 VITALS — BP 162/102 | HR 111 | Ht 71.0 in | Wt 276.4 lb

## 2021-08-21 DIAGNOSIS — E7800 Pure hypercholesterolemia, unspecified: Secondary | ICD-10-CM

## 2021-08-21 DIAGNOSIS — I483 Typical atrial flutter: Secondary | ICD-10-CM

## 2021-08-21 DIAGNOSIS — I10 Essential (primary) hypertension: Secondary | ICD-10-CM | POA: Diagnosis not present

## 2021-08-21 DIAGNOSIS — I25810 Atherosclerosis of coronary artery bypass graft(s) without angina pectoris: Secondary | ICD-10-CM | POA: Diagnosis not present

## 2021-08-21 DIAGNOSIS — Z951 Presence of aortocoronary bypass graft: Secondary | ICD-10-CM | POA: Diagnosis not present

## 2021-08-21 MED ORDER — BISOPROLOL FUMARATE 5 MG PO TABS: 5.0000 mg | ORAL_TABLET | Freq: Every day | ORAL | 6 refills | Status: DC

## 2021-08-21 MED ORDER — RIVAROXABAN 20 MG PO TABS: 20.0000 mg | ORAL_TABLET | Freq: Every day | ORAL | 6 refills | Status: DC

## 2021-08-21 NOTE — Addendum Note (Signed)
Addended by: Kathyrn Lass on: 08/21/2021 12:05 PM ? ? Modules accepted: Orders ? ?

## 2021-08-21 NOTE — Patient Instructions (Signed)
Stop ASA ? ?Start Xarelto 20 mg daily ? ?Start bisoprolol 5 mg daily to help control your heart rate.  ? ?Continue your other therapy ? ?We will check lab work today and schedule you for an Echocardiogram ? ?Follow up in one month. ?

## 2021-08-22 LAB — COMPREHENSIVE METABOLIC PANEL
ALT: 23 IU/L (ref 0–44)
AST: 19 IU/L (ref 0–40)
Albumin/Globulin Ratio: 1.2 (ref 1.2–2.2)
Albumin: 4.3 g/dL (ref 3.7–4.7)
Alkaline Phosphatase: 89 IU/L (ref 44–121)
BUN/Creatinine Ratio: 21 (ref 10–24)
BUN: 21 mg/dL (ref 8–27)
Bilirubin Total: 0.6 mg/dL (ref 0.0–1.2)
CO2: 24 mmol/L (ref 20–29)
Calcium: 9.5 mg/dL (ref 8.6–10.2)
Chloride: 104 mmol/L (ref 96–106)
Creatinine, Ser: 0.99 mg/dL (ref 0.76–1.27)
Globulin, Total: 3.6 g/dL (ref 1.5–4.5)
Glucose: 92 mg/dL (ref 70–99)
Potassium: 4.6 mmol/L (ref 3.5–5.2)
Sodium: 141 mmol/L (ref 134–144)
Total Protein: 7.9 g/dL (ref 6.0–8.5)
eGFR: 81 mL/min/{1.73_m2} (ref >59)

## 2021-08-22 LAB — CBC WITH DIFFERENTIAL/PLATELET
Basophils Absolute: 0 10*3/uL (ref 0.0–0.2)
Basos: 1 %
EOS (ABSOLUTE): 0.1 10*3/uL (ref 0.0–0.4)
Eos: 1 %
Hematocrit: 40.6 % (ref 37.5–51.0)
Hemoglobin: 12.9 g/dL — ABNORMAL LOW (ref 13.0–17.7)
Immature Grans (Abs): 0 10*3/uL (ref 0.0–0.1)
Immature Granulocytes: 0 %
Lymphocytes Absolute: 1.7 10*3/uL (ref 0.7–3.1)
Lymphs: 32 %
MCH: 27.5 pg (ref 26.6–33.0)
MCHC: 31.8 g/dL (ref 31.5–35.7)
MCV: 87 fL (ref 79–97)
Monocytes Absolute: 0.4 10*3/uL (ref 0.1–0.9)
Monocytes: 7 %
Neutrophils Absolute: 3.2 10*3/uL (ref 1.4–7.0)
Neutrophils: 59 %
Platelets: 219 10*3/uL (ref 150–450)
RBC: 4.69 x10E6/uL (ref 4.14–5.80)
RDW: 14.4 % (ref 11.6–15.4)
WBC: 5.4 10*3/uL (ref 3.4–10.8)

## 2021-08-22 LAB — LIPID PANEL
Chol/HDL Ratio: 4 ratio (ref 0.0–5.0)
Cholesterol, Total: 113 mg/dL (ref 100–199)
HDL: 28 mg/dL — ABNORMAL LOW (ref >39)
LDL Chol Calc (NIH): 71 mg/dL (ref 0–99)
Triglycerides: 68 mg/dL (ref 0–149)
VLDL Cholesterol Cal: 14 mg/dL (ref 5–40)

## 2021-08-22 LAB — TSH: TSH: 2.24 u[IU]/mL (ref 0.450–4.500)

## 2021-08-27 ENCOUNTER — Telehealth: Payer: Self-pay | Admitting: Cardiology

## 2021-08-27 ENCOUNTER — Other Ambulatory Visit: Payer: Self-pay | Admitting: Cardiovascular Disease

## 2021-08-27 NOTE — Telephone Encounter (Signed)
Spoke to patient he stated he cannot take Bisoprolol 5 mg.Stated makes him sob,tired, no energy.Advised try taking 1/2 tablet 2.5 mg.daily.Dr.Jordan is out of office today.I will send message to him. ?

## 2021-08-27 NOTE — Telephone Encounter (Signed)
Pt c/o medication issue: ? ?1. Name of Medication: bisoprolol (ZEBETA) 5 MG tablet ? ?2. How are you currently taking this medication (dosage and times per day)? Once daily as directed  ? ?3. Are you having a reaction (difficulty breathing--STAT)? SOB, fatigue ? ?4. What is your medication issue? Patient states he did not have these symptoms before starting the medicine  ? ?Pt c/o Shortness Of Breath: STAT if SOB developed within the last 24 hours or pt is noticeably SOB on the phone ? ?1. Are you currently SOB (can you hear that pt is SOB on the phone)?  ? ?2. How long have you been experiencing SOB? Since starting the new medication after visit 08/21/21 ? ?3. Are you SOB when sitting or when up moving around? All the time  ? ?4. Are you currently experiencing any other symptoms? fatigue ?

## 2021-08-29 NOTE — Telephone Encounter (Signed)
Agree have him try and take 2.5 mg daily. Let us know if not able to tolerate that dose ? ?Alezandra Egli [place] MD, [facility] ? ?

## 2021-09-03 NOTE — Telephone Encounter (Signed)
Spoke to patient he stated he cannot take Bisoprolol 2.5 mg either.Stated he had to stop taking.Dr.Jordan was made aware.Advised to have echo as scheduled. ?

## 2021-09-03 NOTE — Addendum Note (Signed)
Addended by: Neoma Laming on: 09/03/2021 05:00 PM ? ? Modules accepted: Orders ? ?

## 2021-09-05 ENCOUNTER — Ambulatory Visit (HOSPITAL_COMMUNITY): Payer: Medicare Other | Attending: Cardiology

## 2021-09-05 DIAGNOSIS — I25810 Atherosclerosis of coronary artery bypass graft(s) without angina pectoris: Secondary | ICD-10-CM

## 2021-09-05 DIAGNOSIS — E7800 Pure hypercholesterolemia, unspecified: Secondary | ICD-10-CM

## 2021-09-05 DIAGNOSIS — Z951 Presence of aortocoronary bypass graft: Secondary | ICD-10-CM

## 2021-09-05 DIAGNOSIS — I483 Typical atrial flutter: Secondary | ICD-10-CM | POA: Diagnosis present

## 2021-09-05 DIAGNOSIS — I10 Essential (primary) hypertension: Secondary | ICD-10-CM | POA: Diagnosis not present

## 2021-09-05 LAB — ECHOCARDIOGRAM COMPLETE
MV M vel: 5.58 m/s
MV Peak grad: 124.5 mmHg
P 1/2 time: 343 msec
Radius: 0.6 cm
S' Lateral: 4.9 cm

## 2021-09-06 ENCOUNTER — Other Ambulatory Visit: Payer: Self-pay

## 2021-09-06 ENCOUNTER — Other Ambulatory Visit: Payer: Self-pay | Admitting: Cardiology

## 2021-09-06 ENCOUNTER — Telehealth: Payer: Self-pay

## 2021-09-06 DIAGNOSIS — Z01812 Encounter for preprocedural laboratory examination: Secondary | ICD-10-CM

## 2021-09-06 DIAGNOSIS — I25810 Atherosclerosis of coronary artery bypass graft(s) without angina pectoris: Secondary | ICD-10-CM

## 2021-09-06 NOTE — Telephone Encounter (Signed)
?  [facility] ?[place] ?[address] ?[place] [address] ?Dept: [phone] ?LocPK:[id number] ? ?Joseph Kidd  09/06/2021 ? ?You are scheduled for a Cardiac Catheterization on Thursday, May 25 with Dr. Peter Martinique. ? ?1. Please arrive at the Main Entrance A at [facility]: [place], [place] [address] at 5:30 AM (This time is two hours before your procedure to ensure your preparation). Free valet parking service is available.  ? ?Special note: Every effort is made to have your procedure done on time. Please understand that emergencies sometimes delay scheduled procedures. ? ?2. Diet: Do not eat solid foods after midnight.  You may have clear liquids until 5 AM upon the day of the procedure. ? ?3. Labs: You will need to have blood drawn on Thursday 5/18 at [facility] do not need to be fasting. ? ?4. Medication instructions in preparation for your procedure: ? ? ? ? ?Hold Xarelto 2 days before Cath. ? ? ? ?On the morning of your procedure, take Aspirin 81 mg and any morning medicines NOT listed above.  You may use sips of water. ? ?5. Plan to go home the same day, you will only stay overnight if medically necessary. ?6. You MUST have a responsible adult to drive you home. ?7. An adult MUST be with you the first 24 hours after you arrive home. ?8. Bring a current list of your medications, and the last time and date medication taken. ?9. Bring ID and current insurance cards. ?10.Please wear clothes that are easy to get on and off and wear slip-on shoes. ? ?Thank you for allowing [place] to care for you! ?  -- [facility] ? ?

## 2021-09-09 ENCOUNTER — Other Ambulatory Visit: Payer: Self-pay | Admitting: Cardiology

## 2021-09-09 DIAGNOSIS — I5021 Acute systolic (congestive) heart failure: Secondary | ICD-10-CM

## 2021-09-09 MED ORDER — SODIUM CHLORIDE 0.9% FLUSH: 3.0000 mL | Freq: Two times a day (BID) | INTRAVENOUS | Status: DC

## 2021-09-12 LAB — CBC WITH DIFFERENTIAL/PLATELET
Basophils Absolute: 0 10*3/uL (ref 0.0–0.2)
Basos: 1 %
EOS (ABSOLUTE): 0.1 10*3/uL (ref 0.0–0.4)
Eos: 2 %
Hematocrit: 37.8 % (ref 37.5–51.0)
Hemoglobin: 12.3 g/dL — ABNORMAL LOW (ref 13.0–17.7)
Immature Grans (Abs): 0 10*3/uL (ref 0.0–0.1)
Immature Granulocytes: 0 %
Lymphocytes Absolute: 1.6 10*3/uL (ref 0.7–3.1)
Lymphs: 32 %
MCH: 27.5 pg (ref 26.6–33.0)
MCHC: 32.5 g/dL (ref 31.5–35.7)
MCV: 84 fL (ref 79–97)
Monocytes Absolute: 0.4 10*3/uL (ref 0.1–0.9)
Monocytes: 7 %
Neutrophils Absolute: 2.8 10*3/uL (ref 1.4–7.0)
Neutrophils: 58 %
Platelets: 237 10*3/uL (ref 150–450)
RBC: 4.48 x10E6/uL (ref 4.14–5.80)
RDW: 13.9 % (ref 11.6–15.4)
WBC: 4.9 10*3/uL (ref 3.4–10.8)

## 2021-09-12 LAB — BASIC METABOLIC PANEL
BUN/Creatinine Ratio: 19 (ref 10–24)
BUN: 22 mg/dL (ref 8–27)
CO2: 25 mmol/L (ref 20–29)
Calcium: 8.9 mg/dL (ref 8.6–10.2)
Chloride: 103 mmol/L (ref 96–106)
Creatinine, Ser: 1.14 mg/dL (ref 0.76–1.27)
Glucose: 107 mg/dL — ABNORMAL HIGH (ref 70–99)
Potassium: 4.3 mmol/L (ref 3.5–5.2)
Sodium: 138 mmol/L (ref 134–144)
eGFR: 68 mL/min/{1.73_m2} (ref >59)

## 2021-09-12 LAB — PT AND PTT
INR: 1.3 — ABNORMAL HIGH (ref 0.9–1.2)
Prothrombin Time: 13.7 s — ABNORMAL HIGH (ref 9.1–12.0)
aPTT: 34 s — ABNORMAL HIGH (ref 24–33)

## 2021-09-18 ENCOUNTER — Telehealth: Payer: Self-pay | Admitting: *Deleted

## 2021-09-18 NOTE — Telephone Encounter (Addendum)
Cardiac Catheterization scheduled at [facility] for: Thursday Sep 19, 2021 7:30 AM Arrival time and place: [place] Entrance A at: 5:30 AM   Nothing to eat after midnight prior to procedure, clear liquids until 5 AM day of procedure.  Medication instructions: Hold: -Xarelto-none 09/17/21 until post procedure -Except hold medications usual morning medications can be taken with sips of water including aspirin 81 mg.  Confirmed patient has responsible adult to drive home post procedure and be with patient first 24 hours after arriving home.  Patient reports no new symptoms concerning for COVID-19/no exposure to COVID-19 in the past 10 days.  Reviewed procedure instructions with patient.

## 2021-09-19 ENCOUNTER — Other Ambulatory Visit: Payer: Self-pay

## 2021-09-19 ENCOUNTER — Other Ambulatory Visit (HOSPITAL_COMMUNITY): Payer: Self-pay

## 2021-09-19 ENCOUNTER — Encounter (HOSPITAL_COMMUNITY)
Admission: RE | Disposition: A | Discharge status: Home / Self Care | Payer: Self-pay | Source: Home / Self Care | Attending: Cardiology

## 2021-09-19 ENCOUNTER — Ambulatory Visit (HOSPITAL_COMMUNITY)
Admission: RE | Admit: 2021-09-19 | Discharge: 2021-09-19 | Disposition: A | Discharge status: Home / Self Care | Payer: Medicare Other | Attending: Cardiology | Admitting: Cardiology

## 2021-09-19 DIAGNOSIS — E7800 Pure hypercholesterolemia, unspecified: Secondary | ICD-10-CM | POA: Diagnosis not present

## 2021-09-19 DIAGNOSIS — I483 Typical atrial flutter: Secondary | ICD-10-CM | POA: Insufficient documentation

## 2021-09-19 DIAGNOSIS — Z6838 Body mass index (BMI) 38.0-38.9, adult: Secondary | ICD-10-CM | POA: Insufficient documentation

## 2021-09-19 DIAGNOSIS — I4891 Unspecified atrial fibrillation: Secondary | ICD-10-CM | POA: Diagnosis not present

## 2021-09-19 DIAGNOSIS — I2584 Coronary atherosclerosis due to calcified coronary lesion: Secondary | ICD-10-CM | POA: Diagnosis not present

## 2021-09-19 DIAGNOSIS — Z7901 Long term (current) use of anticoagulants: Secondary | ICD-10-CM | POA: Insufficient documentation

## 2021-09-19 DIAGNOSIS — I110 Hypertensive heart disease with heart failure: Secondary | ICD-10-CM | POA: Diagnosis not present

## 2021-09-19 DIAGNOSIS — Z96653 Presence of artificial knee joint, bilateral: Secondary | ICD-10-CM | POA: Insufficient documentation

## 2021-09-19 DIAGNOSIS — I25810 Atherosclerosis of coronary artery bypass graft(s) without angina pectoris: Secondary | ICD-10-CM | POA: Diagnosis not present

## 2021-09-19 DIAGNOSIS — Z79899 Other long term (current) drug therapy: Secondary | ICD-10-CM | POA: Diagnosis not present

## 2021-09-19 DIAGNOSIS — Z86718 Personal history of other venous thrombosis and embolism: Secondary | ICD-10-CM | POA: Insufficient documentation

## 2021-09-19 DIAGNOSIS — I2582 Chronic total occlusion of coronary artery: Secondary | ICD-10-CM | POA: Diagnosis not present

## 2021-09-19 DIAGNOSIS — Z951 Presence of aortocoronary bypass graft: Secondary | ICD-10-CM

## 2021-09-19 DIAGNOSIS — I5021 Acute systolic (congestive) heart failure: Secondary | ICD-10-CM

## 2021-09-19 DIAGNOSIS — Z955 Presence of coronary angioplasty implant and graft: Secondary | ICD-10-CM

## 2021-09-19 DIAGNOSIS — I252 Old myocardial infarction: Secondary | ICD-10-CM | POA: Insufficient documentation

## 2021-09-19 DIAGNOSIS — I10 Essential (primary) hypertension: Secondary | ICD-10-CM | POA: Diagnosis present

## 2021-09-19 DIAGNOSIS — M170 Bilateral primary osteoarthritis of knee: Secondary | ICD-10-CM | POA: Diagnosis not present

## 2021-09-19 DIAGNOSIS — I272 Pulmonary hypertension, unspecified: Secondary | ICD-10-CM | POA: Diagnosis not present

## 2021-09-19 DIAGNOSIS — I2510 Atherosclerotic heart disease of native coronary artery without angina pectoris: Secondary | ICD-10-CM | POA: Insufficient documentation

## 2021-09-19 HISTORY — PX: CORONARY STENT INTERVENTION: CATH118234

## 2021-09-19 HISTORY — PX: RIGHT/LEFT HEART CATH AND CORONARY/GRAFT ANGIOGRAPHY: CATH118267

## 2021-09-19 LAB — POCT I-STAT EG7
Acid-Base Excess: 1 mmol/L (ref 0.0–2.0)
Acid-Base Excess: 1 mmol/L (ref 0.0–2.0)
Bicarbonate: 25.6 mmol/L (ref 20.0–28.0)
Bicarbonate: 26.2 mmol/L (ref 20.0–28.0)
Calcium, Ion: 1.18 mmol/L (ref 1.15–1.40)
Calcium, Ion: 1.23 mmol/L (ref 1.15–1.40)
HCT: 39 % (ref 39.0–52.0)
HCT: 40 % (ref 39.0–52.0)
Hemoglobin: 13.3 g/dL (ref 13.0–17.0)
Hemoglobin: 13.6 g/dL (ref 13.0–17.0)
O2 Saturation: 72 %
O2 Saturation: 74 %
Potassium: 3.6 mmol/L (ref 3.5–5.1)
Potassium: 3.7 mmol/L (ref 3.5–5.1)
Sodium: 143 mmol/L (ref 135–145)
Sodium: 144 mmol/L (ref 135–145)
TCO2: 27 mmol/L (ref 22–32)
TCO2: 27 mmol/L (ref 22–32)
pCO2, Ven: 41.4 mmHg — ABNORMAL LOW (ref 44–60)
pCO2, Ven: 42 mmHg — ABNORMAL LOW (ref 44–60)
pH, Ven: 7.399 (ref 7.25–7.43)
pH, Ven: 7.403 (ref 7.25–7.43)
pO2, Ven: 38 mmHg (ref 32–45)
pO2, Ven: 39 mmHg (ref 32–45)

## 2021-09-19 LAB — POCT I-STAT 7, (LYTES, BLD GAS, ICA,H+H)
Acid-base deficit: 1 mmol/L (ref 0.0–2.0)
Bicarbonate: 24.5 mmol/L (ref 20.0–28.0)
Calcium, Ion: 1.22 mmol/L (ref 1.15–1.40)
HCT: 38 % — ABNORMAL LOW (ref 39.0–52.0)
Hemoglobin: 12.9 g/dL — ABNORMAL LOW (ref 13.0–17.0)
O2 Saturation: 94 %
Potassium: 3.5 mmol/L (ref 3.5–5.1)
Sodium: 137 mmol/L (ref 135–145)
TCO2: 26 mmol/L (ref 22–32)
pCO2 arterial: 40.9 mmHg (ref 32–48)
pH, Arterial: 7.385 (ref 7.35–7.45)
pO2, Arterial: 73 mmHg — ABNORMAL LOW (ref 83–108)

## 2021-09-19 SURGERY — RIGHT/LEFT HEART CATH AND CORONARY/GRAFT ANGIOGRAPHY
Anesthesia: LOCAL

## 2021-09-19 MED ORDER — SODIUM CHLORIDE 0.9 % IV SOLN: 250.0000 mL | INTRAVENOUS | Status: DC | PRN

## 2021-09-19 MED ORDER — HEPARIN SODIUM (PORCINE) 1000 UNIT/ML IJ SOLN
INTRAMUSCULAR | Status: DC | PRN
  Administered 2021-09-19: 6000 [IU] via INTRAVENOUS
  Administered 2021-09-19: 4000 [IU] via INTRAVENOUS
  Administered 2021-09-19: 5000 [IU] via INTRAVENOUS

## 2021-09-19 MED ORDER — SODIUM CHLORIDE 0.9% FLUSH: 3.0000 mL | INTRAVENOUS | Status: DC | PRN

## 2021-09-19 MED ORDER — ONDANSETRON HCL 4 MG/2ML IJ SOLN: 4.0000 mg | Freq: Four times a day (QID) | INTRAMUSCULAR | Status: DC | PRN

## 2021-09-19 MED ORDER — FAMOTIDINE IN NACL 20-0.9 MG/50ML-% IV SOLN
INTRAVENOUS | Status: AC
  Filled 2021-09-19: qty 50

## 2021-09-19 MED ORDER — HEPARIN SODIUM (PORCINE) 1000 UNIT/ML IJ SOLN
INTRAMUSCULAR | Status: AC
  Filled 2021-09-19: qty 10

## 2021-09-19 MED ORDER — CLOPIDOGREL BISULFATE 75 MG PO TABS
75.0000 mg | ORAL_TABLET | Freq: Every day | ORAL | 1 refills | Status: DC
  Filled 2021-09-19: qty 90, 90d supply, fill #0

## 2021-09-19 MED ORDER — HEPARIN (PORCINE) IN NACL 1000-0.9 UT/500ML-% IV SOLN
INTRAVENOUS | Status: AC
  Filled 2021-09-19: qty 1000

## 2021-09-19 MED ORDER — LABETALOL HCL 5 MG/ML IV SOLN
10.0000 mg | INTRAVENOUS | Status: DC | PRN
  Administered 2021-09-19 (×3): 10 mg via INTRAVENOUS
  Filled 2021-09-19 (×2): qty 4

## 2021-09-19 MED ORDER — LIDOCAINE HCL (PF) 1 % IJ SOLN
INTRAMUSCULAR | Status: AC
  Filled 2021-09-19: qty 30

## 2021-09-19 MED ORDER — MIDAZOLAM HCL 2 MG/2ML IJ SOLN
INTRAMUSCULAR | Status: DC | PRN
  Administered 2021-09-19: 2 mg via INTRAVENOUS

## 2021-09-19 MED ORDER — HYDRALAZINE HCL 20 MG/ML IJ SOLN
10.0000 mg | Freq: Once | INTRAMUSCULAR | Status: AC
  Administered 2021-09-19: 10 mg via INTRAVENOUS
  Filled 2021-09-19: qty 1

## 2021-09-19 MED ORDER — ASPIRIN 81 MG PO CHEW: 81.0000 mg | CHEWABLE_TABLET | ORAL | Status: DC

## 2021-09-19 MED ORDER — NITROGLYCERIN 1 MG/10 ML FOR IR/CATH LAB
INTRA_ARTERIAL | Status: DC | PRN
  Administered 2021-09-19: 200 ug via INTRACORONARY

## 2021-09-19 MED ORDER — VERAPAMIL HCL 2.5 MG/ML IV SOLN
INTRAVENOUS | Status: AC
  Filled 2021-09-19: qty 2

## 2021-09-19 MED ORDER — IOHEXOL 350 MG/ML SOLN
INTRAVENOUS | Status: DC | PRN
  Administered 2021-09-19: 150 mL

## 2021-09-19 MED ORDER — FAMOTIDINE IN NACL 20-0.9 MG/50ML-% IV SOLN
INTRAVENOUS | Status: DC | PRN
  Administered 2021-09-19: 20 mg via INTRAVENOUS

## 2021-09-19 MED ORDER — CLOPIDOGREL BISULFATE 75 MG PO TABS: 75.0000 mg | ORAL_TABLET | Freq: Every day | ORAL | Status: DC

## 2021-09-19 MED ORDER — FENTANYL CITRATE (PF) 100 MCG/2ML IJ SOLN
INTRAMUSCULAR | Status: DC | PRN
  Administered 2021-09-19: 25 ug via INTRAVENOUS

## 2021-09-19 MED ORDER — SODIUM CHLORIDE 0.9 % IV SOLN: INTRAVENOUS | Status: AC

## 2021-09-19 MED ORDER — SODIUM CHLORIDE 0.9% FLUSH: 3.0000 mL | Freq: Two times a day (BID) | INTRAVENOUS | Status: DC

## 2021-09-19 MED ORDER — NITROGLYCERIN 1 MG/10 ML FOR IR/CATH LAB
INTRA_ARTERIAL | Status: AC
Start: 2021-09-19 — End: ?
  Filled 2021-09-19: qty 10

## 2021-09-19 MED ORDER — LIDOCAINE HCL (PF) 1 % IJ SOLN
INTRAMUSCULAR | Status: DC | PRN
  Administered 2021-09-19 (×2): 2 mL

## 2021-09-19 MED ORDER — ROSUVASTATIN CALCIUM 40 MG PO TABS: 40.0000 mg | ORAL_TABLET | Freq: Every day | ORAL | 3 refills | Status: DC

## 2021-09-19 MED ORDER — CLOPIDOGREL BISULFATE 300 MG PO TABS
ORAL_TABLET | ORAL | Status: DC | PRN
  Administered 2021-09-19: 600 mg via ORAL

## 2021-09-19 MED ORDER — CLOPIDOGREL BISULFATE 300 MG PO TABS
ORAL_TABLET | ORAL | Status: AC
  Filled 2021-09-19: qty 1

## 2021-09-19 MED ORDER — ASPIRIN 81 MG PO TBEC
81.0000 mg | DELAYED_RELEASE_TABLET | Freq: Every day | ORAL | 0 refills | Status: DC
  Filled 2021-09-19: qty 30, 30d supply, fill #0

## 2021-09-19 MED ORDER — SODIUM CHLORIDE 0.9 % IV SOLN: INTRAVENOUS | Status: DC

## 2021-09-19 MED ORDER — ACETAMINOPHEN 325 MG PO TABS: 650.0000 mg | ORAL_TABLET | ORAL | Status: DC | PRN

## 2021-09-19 MED ORDER — VERAPAMIL HCL 2.5 MG/ML IV SOLN
INTRAVENOUS | Status: DC | PRN
  Administered 2021-09-19: 10 mL via INTRA_ARTERIAL

## 2021-09-19 MED ORDER — HEPARIN (PORCINE) IN NACL 1000-0.9 UT/500ML-% IV SOLN
INTRAVENOUS | Status: DC | PRN
  Administered 2021-09-19 (×2): 500 mL

## 2021-09-19 MED ORDER — HYDRALAZINE HCL 20 MG/ML IJ SOLN: 10.0000 mg | INTRAMUSCULAR | Status: DC | PRN

## 2021-09-19 MED ORDER — NITROGLYCERIN 0.4 MG SL SUBL
0.4000 mg | SUBLINGUAL_TABLET | SUBLINGUAL | 1 refills | Status: AC | PRN
  Filled 2021-09-19: qty 25, 7d supply, fill #0

## 2021-09-19 MED ORDER — CLOPIDOGREL BISULFATE 300 MG PO TABS
ORAL_TABLET | ORAL | Status: AC
Start: 2021-09-19 — End: ?
  Filled 2021-09-19: qty 1

## 2021-09-19 MED ORDER — FENTANYL CITRATE (PF) 100 MCG/2ML IJ SOLN
INTRAMUSCULAR | Status: AC
  Filled 2021-09-19: qty 2

## 2021-09-19 MED ORDER — ASPIRIN 81 MG PO CHEW: 81.0000 mg | CHEWABLE_TABLET | Freq: Every day | ORAL | Status: DC

## 2021-09-19 MED ORDER — MIDAZOLAM HCL 2 MG/2ML IJ SOLN
INTRAMUSCULAR | Status: AC
  Filled 2021-09-19: qty 2

## 2021-09-19 SURGICAL SUPPLY — 23 items
BALL SAPPHIRE NC24 4.0X12 (BALLOONS) ×2
BALLN SAPPHIRE 2.5X12 (BALLOONS) ×2
BALLOON SAPPHIRE 2.5X12 (BALLOONS) IMPLANT
BALLOON SAPPHIRE NC24 4.0X12 (BALLOONS) IMPLANT
BAND CMPR LRG ZPHR (HEMOSTASIS) ×1
BAND ZEPHYR COMPRESS 30 LONG (HEMOSTASIS) ×1 IMPLANT
CATH BALLN WEDGE 5F 110CM (CATHETERS) ×1 IMPLANT
CATH INFINITI 5 FR IM (CATHETERS) ×1 IMPLANT
CATH INFINITI 5FR JL5 (CATHETERS) ×1 IMPLANT
CATH INFINITI 5FR MULTPACK ANG (CATHETERS) ×1 IMPLANT
CATHETER LAUNCHER 6FR MP1 (CATHETERS) ×1 IMPLANT
GLIDESHEATH SLEND SS 6F .021 (SHEATH) ×1 IMPLANT
GUIDEWIRE INQWIRE 1.5J.035X260 (WIRE) IMPLANT
INQWIRE 1.5J .035X260CM (WIRE) ×2
KIT ENCORE 26 ADVANTAGE (KITS) ×1 IMPLANT
KIT HEART LEFT (KITS) ×2 IMPLANT
PACK CARDIAC CATHETERIZATION (CUSTOM PROCEDURE TRAY) ×2 IMPLANT
SHEATH GLIDE SLENDER 4/5FR (SHEATH) ×1 IMPLANT
STENT SYNERGY XD 3.50X16 (Permanent Stent) IMPLANT
SYNERGY XD 3.50X16 (Permanent Stent) ×2 IMPLANT
TRANSDUCER W/STOPCOCK (MISCELLANEOUS) ×2 IMPLANT
TUBING CIL FLEX 10 FLL-RA (TUBING) ×2 IMPLANT
WIRE ASAHI PROWATER 180CM (WIRE) ×1 IMPLANT

## 2021-09-19 NOTE — Progress Notes (Signed)
Seen pt from [id number], educated pt on stent procedure, aspirin, Plavix, zarelto, NTG use, ex guidelines, diet, and CRPII. Patient will be referred to [facility] in [place].   Christen Bame  09/19/2021 2:18 PM

## 2021-09-19 NOTE — Interval H&P Note (Signed)
History and Physical Interval Note:  09/19/2021 7:09 AM  Blanche East  has presented today for surgery, with the diagnosis of low ef, known cad.  The various methods of treatment have been discussed with the patient and family. After consideration of risks, benefits and other options for treatment, the patient has consented to  Procedure(s): RIGHT/LEFT HEART CATH AND CORONARY/GRAFT ANGIOGRAPHY (N/A) as a surgical intervention.  The patient's history has been reviewed, patient examined, no change in status, stable for surgery.  I have reviewed the patient's chart and labs.  Questions were answered to the patient's satisfaction.   Cath Lab Visit (complete for each Cath Lab visit)  Clinical Evaluation Leading to the Procedure:   ACS: No.  Non-ACS:    Anginal Classification: CCS I  Anti-ischemic medical therapy: Maximal Therapy (2 or more classes of medications)  Non-Invasive Test Results: Intermediate-risk stress test findings: cardiac mortality 1-3%/year  Prior CABG: Previous CABG        Theron [facility] 09/19/2021 7:09 AM

## 2021-09-19 NOTE — Discharge Instructions (Signed)

## 2021-09-19 NOTE — Progress Notes (Signed)
Spoke with Lillia Abed, NP r/t pt's BP and tx-no orders received

## 2021-09-19 NOTE — Discharge Summary (Signed)
Discharge Summary for Same Day PCI   Patient ID: Joseph Kidd MRN: [id number]; DOB: 25-Nov-1950  Admit date: 09/19/2021 Discharge date: 09/19/2021  Primary Care Provider: [name], NP  Primary Cardiologist: Peter Martinique, MD  Primary Electrophysiologist:  None   Discharge Diagnoses    Principal Problem:   Acute systolic CHF (congestive heart failure) ([place]) Active Problems:   HTN (hypertension)   Hypercholesterolemia   S/P CABG x 5  Diagnostic Studies/Procedures    Cardiac Catheterization 09/19/2021:   Prox RCA lesion is 95% stenosed.   Mid RCA lesion is 100% stenosed.   Prox LAD lesion is 90% stenosed with 90% stenosed side branch in 1st Diag.   1st Diag lesion is 90% stenosed.   Mid LAD lesion is 100% stenosed.   Ost Cx to Prox Cx lesion is 100% stenosed.   Origin to Prox Graft lesion is 75% stenosed.   A drug-eluting stent was successfully placed using a SYNERGY XD 3.50X16.   Post intervention, there is a 0% residual stenosis.   Origin lesion is 100% stenosed.   1st Mrg lesion is 80% stenosed.   SVG graft was visualized by angiography and is normal in caliber.   SVG graft was visualized by angiography.   Seq SVG- OM1 and OM2 graft was visualized by angiography and is large.   LIMA graft was visualized by angiography and is normal in caliber.   The graft exhibits no disease.   The graft exhibits no disease.   LV end diastolic pressure is normal.   Hemodynamic findings consistent with mild pulmonary hypertension.   Severe 3 vessel occlusive CAD Patent LIMA to the LAD Occluded SVG to the diagonal. The diagonal is a large branch but is poorly suited for PCI due to acute angulation, aneurysmal disease and heavy calcification Patent sequential SVG to OM1 and OM2. There is moderate stenosis in the OM1 just distal to the anastomosis. The OM1 is relatively small Patent SVG to RCA with 75% stenosis in the proximal SVG Normal LV filling pressures. EDP 14 mm Hg Mild  pulmonary HTN. PAP mean 29 mm Hg Good cardiac output with index 3.79.  Successful PCI of the SVG to RCA with DES x 1   Plan: recommend resuming Xarelto this pm. ASA 81 mg for one month. Plavix 75 mg for 6 months. Anticipate same day DC. Will need to treat residual disease in the first diagonal and OM1 medically. Anticipate DCCV for atrial flutter after resumption of Xarelto in 3-4 weeks. Need to work on optimizing medical therapy for CHF. Consider switching to [facility] as outpatient, SGLT2, and/or aldactone. Will need to assess cost of medication adjustments.   Diagnostic Dominance: Right Intervention   _____________   History of Present Illness     Joseph Kidd is a 71 y.o. male with PMH of HTN, HLD intolerant to statins, OA and  CAD s/p CABG. He presented to the hospital with inferior STEMI on 07/03/2016. Emergent cardiac catheterization performed on 07/03/16 showed 95% proximal to mid RCA stenosis, 30% ostial to proximal RCA stenosis, 95% proximal LAD, 80% mid LAD, 100% occluded proximal left circumflex with left to left and right-to-left collaterals, 90% ostial OM1 lesion, EF 50-55%. Echocardiogram performed on the same day showed EF 60-65%, mild LVH, dilated aortic root up to 40 mm, mild MR. He underwent 5 vessel CABG with LIMA to LAD, SVG to diagonal, sequential SVG to OM1 and OM 3, SVG to RCA by Dr. Servando Snare on 07/04/2013. Post procedure, the hospital  course involved volume overload requiring diuresis and also atrial fibrillation with RVR which converted on IV amiodarone. He was also initiated on Crestor.   In July 28, 2016 he complained of persistent wheezing and beta blocker was discontinued. Lisinopril had been discontinued earlier.  He was maintaining NSR and amiodarone was stopped. He was noted to have right leg swelling. LE venous dopplers demonstrated right peroneal vein DVT. He was started on Xarelto and completed his course of therapy in July. In August 2018 he did undergo left TKR. He  was given DVT prophylaxis with Xarelto. In June 2019 he had right TKR. On his last visit he was started on losartan for BP control.   At office visit on 4/26 he was noted to be in new atrial flutter. Recommended to add bisoprolol and Diltiazem, along with restated on Xarelto. Underwent outpatient echo which showed a decline in EF to 40-45%, moderate MR. Cardiac catheterization was arranged for further evaluation.  Hospital Course     The patient underwent cardiac cath as noted above with patent LIMA-LAD, occluded SVG-diag (felt to be poorly suited for PCI), patent sequential SVG-OM1/OM2, patent SVG-RCA with 75% stenosis of the prox segment, treated with PCI/DES x1. Plan for triple therapy with ASA, plavix, Xarelto, then drop ASA. Will need DCCV in 3-4 weeks after resumption of Xarelto. The patient was seen by cardiac rehab while in short stay. There were no observed complications post cath. Radial cath site was re-evaluated prior to discharge and found to be stable without any complications. Instructions/precautions regarding cath site care were given prior to discharge.  Joseph Kidd was seen by Dr. Martinique and determined stable for discharge home. Follow up with our office has been arranged. Medications are listed below. Pertinent changes include addition of plavix, increased crestor to 44m daily.  _____________  Cath/PCI Registry Performance & Quality Measures: Aspirin prescribed? - Yes ADP Receptor Inhibitor (Plavix/Clopidogrel, Brilinta/Ticagrelor or Effient/Prasugrel) prescribed (includes medically managed patients)? - Yes High Intensity Statin (Lipitor 40-863mor Crestor 20-4032mprescribed? - Yes For EF <40%, was ACEI/ARB prescribed? - Not Applicable (EF >/= 40%11%or EF <40%, Aldosterone Antagonist (Spironolactone or Eplerenone) prescribed? - Not Applicable (EF >/= 40%73%ardiac Rehab Phase II ordered (Included Medically managed Patients)? - Yes  _____________   Discharge  Vitals Blood pressure (!) 150/112, pulse 67, temperature 98 F (36.7 C), temperature source Oral, resp. rate 18, height _0  (1.803 m), weight 125.2 kg, SpO2 94 %.  Filed Weights   09/19/21 0556  Weight: 125.2 kg    Last Labs & Radiologic Studies    CBC Recent Labs    09/19/21 0759 09/19/21 0816  HGB 13.6 12.9*  HCT 40.0 38.56.7 Basic Metabolic Panel Recent Labs    09/19/21 0759 09/19/21 0816  NA 143 137  K 3.7 3.5   Liver Function Tests No results for input(s): AST, ALT, ALKPHOS, BILITOT, PROT, ALBUMIN in the last 72 hours. No results for input(s): LIPASE, AMYLASE in the last 72 hours. High Sensitivity Troponin:   No results for input(s): TROPONINIHS in the last 720 hours.  BNP Invalid input(s): POCBNP D-Dimer No results for input(s): DDIMER in the last 72 hours. Hemoglobin A1C No results for input(s): HGBA1C in the last 72 hours. Fasting Lipid Panel No results for input(s): CHOL, HDL, LDLCALC, TRIG, CHOLHDL, LDLDIRECT in the last 72 hours. Thyroid Function Tests No results for input(s): TSH, T4TOTAL, T3FREE, THYROIDAB in the last 72 hours.  Invalid input(s): FREET3 _____________  CARDIAC CATHETERIZATION  Result Date: 09/19/2021   Prox RCA lesion is 95% stenosed.   Mid RCA lesion is 100% stenosed.   Prox LAD lesion is 90% stenosed with 90% stenosed side branch in 1st Diag.   1st Diag lesion is 90% stenosed.   Mid LAD lesion is 100% stenosed.   Ost Cx to Prox Cx lesion is 100% stenosed.   Origin to Prox Graft lesion is 75% stenosed.   A drug-eluting stent was successfully placed using a SYNERGY XD 3.50X16.   Post intervention, there is a 0% residual stenosis.   Origin lesion is 100% stenosed.   1st Mrg lesion is 80% stenosed.   SVG graft was visualized by angiography and is normal in caliber.   SVG graft was visualized by angiography.   Seq SVG- OM1 and OM2 graft was visualized by angiography and is large.   LIMA graft was visualized by angiography and is normal in  caliber.   The graft exhibits no disease.   The graft exhibits no disease.   LV end diastolic pressure is normal.   Hemodynamic findings consistent with mild pulmonary hypertension. Severe 3 vessel occlusive CAD Patent LIMA to the LAD Occluded SVG to the diagonal. The diagonal is a large branch but is poorly suited for PCI due to acute angulation, aneurysmal disease and heavy calcification Patent sequential SVG to OM1 and OM2. There is moderate stenosis in the OM1 just distal to the anastomosis. The OM1 is relatively small Patent SVG to RCA with 75% stenosis in the proximal SVG Normal LV filling pressures. EDP 14 mm Hg Mild pulmonary HTN. PAP mean 29 mm Hg Good cardiac output with index 3.79. Successful PCI of the SVG to RCA with DES x 1 Plan: recommend resuming Xarelto this pm. ASA 81 mg for one month. Plavix 75 mg for 6 months. Anticipate same day DC. Will need to treat residual disease in the first diagonal and OM1 medically. Anticipate DCCV for atrial flutter after resumption of Xarelto in 3-4 weeks. Need to work on optimizing medical therapy for CHF. Consider switching to [facility] as outpatient, SGLT2, and/or aldactone. Will need to assess cost of medication adjustments.   ECHOCARDIOGRAM COMPLETE  Result Date: 09/05/2021    ECHOCARDIOGRAM REPORT   Patient Name:   Joseph Kidd Date of Exam: 09/05/2021 Medical Rec #:  [id number]       Height:       71.0 in Accession #:    [id number]      Weight:       276.4 lb Date of Birth:  1950-08-19        BSA:          2.419 m Patient Age:    1 years        BP:           162/102 mmHg Patient Gender: M               HR:           112 bpm. Exam Location:  [facility] Procedure: 2D Echo, 3D Echo, Cardiac Doppler, Color Doppler and Strain Analysis Indications:    I48.3 Atrial flutter  History:        Patient has prior history of Echocardiogram examinations, most                 recent 07/03/2016. Previous Myocardial Infarction and CAD, Prior                 CABG, DVT; Risk  Factors:Dyslipidemia.  Sonographer:    Basilia Jumbo BS, RDCS Referring Phys: [id number] PETER M Martinique IMPRESSIONS  1. Left ventricular ejection fraction, by estimation, is 40 to 45%. The left ventricle has mildly decreased function. The left ventricle demonstrates regional wall motion abnormalities with basal inferior akinesis, inferolateral severe hypokinesis, and basal anterolateral akinesis. The left ventricular internal cavity size was mildly dilated. There is mild left ventricular hypertrophy. Left ventricular diastolic parameters are indeterminate. The average left ventricular global longitudinal strain is -12.4 %. The global longitudinal strain is abnormal.  2. Right ventricular systolic function is mildly reduced. The right ventricular size is mildly enlarged. There is moderately elevated pulmonary artery systolic pressure. The estimated right ventricular systolic pressure is 17.6 mmHg.  3. Left atrial size was severely dilated.  4. Right atrial size was moderately dilated.  5. The mitral valve is abnormal. Moderate mitral valve regurgitation, suspect infarct-related MR with inferior/lateral wall motion abnormalities. No evidence of mitral stenosis. Moderate mitral annular calcification with some mobile calcification at the  annulus.  6. The aortic valve is tricuspid. There is mild calcification of the aortic valve. Aortic valve regurgitation is moderate. No aortic stenosis is present.  7. Aortic dilatation noted. There is moderate dilatation of the aortic root and of the ascending aorta, measuring 46 mm.  8. The inferior vena cava is dilated in size with >50% respiratory variability, suggesting right atrial pressure of 8 mmHg.  9. The patient is in atrial flutter. Comparison(s): 07/03/16 EF 60-65%. FINDINGS  Left Ventricle: Left ventricular ejection fraction, by estimation, is 40 to 45%. The left ventricle has mildly decreased function. The left ventricle demonstrates regional wall motion abnormalities. The average  left ventricular global longitudinal strain is -12.4 %. The global longitudinal strain is abnormal. The left ventricular internal cavity size was mildly dilated. There is mild left ventricular hypertrophy. Left ventricular diastolic parameters are indeterminate. Right Ventricle: The right ventricular size is mildly enlarged. No increase in right ventricular wall thickness. Right ventricular systolic function is mildly reduced. There is moderately elevated pulmonary artery systolic pressure. The tricuspid regurgitant velocity is 3.14 m/s, and with an assumed right atrial pressure of 8 mmHg, the estimated right ventricular systolic pressure is 16.0 mmHg. Left Atrium: Left atrial size was severely dilated. Right Atrium: Right atrial size was moderately dilated. Pericardium: There is no evidence of pericardial effusion. Mitral Valve: The mitral valve is abnormal. There is moderate calcification of the mitral valve leaflet(s). Moderate mitral annular calcification. Moderate mitral valve regurgitation. No evidence of mitral valve stenosis. Tricuspid Valve: The tricuspid valve is normal in structure. Tricuspid valve regurgitation is trivial. Aortic Valve: The aortic valve is tricuspid. There is mild calcification of the aortic valve. Aortic valve regurgitation is moderate. Aortic regurgitation PHT measures 343 msec. No aortic stenosis is present. Pulmonic Valve: The pulmonic valve was normal in structure. Pulmonic valve regurgitation is mild. Aorta: Aortic dilatation noted. There is moderate dilatation of the aortic root and of the ascending aorta, measuring 46 mm. Venous: The inferior vena cava is dilated in size with greater than 50% respiratory variability, suggesting right atrial pressure of 8 mmHg. IAS/Shunts: No atrial level shunt detected by color flow Doppler.  LEFT VENTRICLE PLAX 2D LVIDd:         6.00 cm LVIDs:         4.90 cm   2D Longitudinal Strain LV PW:         1.30 cm   2D Strain GLS (A2C):   -11.4 % LV  IVS:        1.40 cm   2D Strain GLS (A3C):   -14.6 % LVOT diam:     2.00 cm   2D Strain GLS (A4C):   -11.2 % LV SV:         62        2D Strain GLS Avg:     -12.4 % LV SV Index:   26 LVOT Area:     3.14 cm                           3D Volume EF:                          3D EF:        36 %                          LV EDV:       203 ml                          LV ESV:       130 ml                          LV SV:        73 ml RIGHT VENTRICLE            IVC RV Basal diam:  4.90 cm    IVC diam: 2.60 cm RV S prime:     6.38 cm/s TAPSE (M-mode): 1.2 cm RVSP:           47.4 mmHg LEFT ATRIUM              Index        RIGHT ATRIUM           Index LA diam:        5.60 cm  2.31 cm/m   RA Pressure: 8.00 mmHg LA Vol (A2C):   151.0 ml 62.41 ml/m  RA Area:     32.00 cm LA Vol (A4C):   138.0 ml 57.04 ml/m  RA Volume:   133.00 ml 54.97 ml/m LA Biplane Vol: 145.0 ml 59.93 ml/m  AORTIC VALVE LVOT Vmax:   110.00 cm/s LVOT Vmean:  75.600 cm/s LVOT VTI:    0.197 m AI PHT:      343 msec  AORTA Ao Root diam: 4.60 cm Ao Asc diam:  4.60 cm MR Peak grad:    124.5 mmHg   TRICUSPID VALVE MR Mean grad:    79.0 mmHg    TR Peak grad:   39.4 mmHg MR Vmax:         558.00 cm/s  TR Vmax:        314.00 cm/s MR Vmean:        421.0 cm/s   Estimated RAP:  8.00 mmHg MR PISA:         2.26 cm     RVSP:           47.4 mmHg MR PISA Eff ROA: 14 mm MR PISA Radius:  0.60 cm      SHUNTS                               Systemic VTI:  0.20 m  Systemic Diam: 2.00 cm Dalton McleanMD Electronically signed by Franki Monte Signature Date/Time: 09/05/2021/10:39:46 AM    Final     Disposition   Pt is being discharged home today in good condition.  Follow-up Plans & Appointments     Follow-up Information     Martinique, Peter M, MD Follow up on 10/02/2021.   Specialty: Cardiology Why: at 10:40am for your follow up appt Contact information: [address] [place] [address] [phone]                 Discharge Instructions     AMB Referral to Cardiac Rehabilitation - Phase II   Complete by: As directed    Diagnosis: Coronary Stents   After initial evaluation and assessments completed: Virtual Based Care may be provided alone or in conjunction with Phase 2 Cardiac Rehab based on patient barriers.: Yes        Discharge Medications   Allergies as of 09/19/2021       Reactions   Atorvastatin    Severe myalgias   Oxycodone-acetaminophen Nausea Only   Chlorhexidine Rash   Body wash prior to surgery, not IV start kit cleaner        Medication List     TAKE these medications    aspirin EC 81 MG tablet Take 1 tablet (81 mg total) by mouth daily. Swallow whole.   clopidogrel 75 MG tablet Commonly known as: Plavix Take 1 tablet (75 mg total) by mouth daily.   diltiazem 240 MG 24 hr capsule Commonly known as: CARDIZEM CD Take 1 capsule (240 mg total) by mouth daily.   doxazosin 2 MG tablet Commonly known as: Cardura Take 2 mg at bedtime   ezetimibe 10 MG tablet Commonly known as: ZETIA TAKE 1 TABLET BY MOUTH EVERY DAY   losartan 100 MG tablet Commonly known as: COZAAR TAKE 1 TABLET BY MOUTH EVERY DAY.   nitroGLYCERIN 0.4 MG SL tablet Commonly known as: Nitrostat Place 1 tablet (0.4 mg total) under the tongue every 5 (five) minutes as needed for chest pain.   rivaroxaban 20 MG Tabs tablet Commonly known as: XARELTO Take 1 tablet (20 mg total) by mouth daily with supper.   rosuvastatin 40 MG tablet Commonly known as: CRESTOR Take 1 tablet (40 mg total) by mouth daily. What changed:  medication strength how much to take         Allergies Allergies  Allergen Reactions   Atorvastatin     Severe myalgias   Oxycodone-Acetaminophen Nausea Only   Chlorhexidine Rash    Body wash prior to surgery, not IV start kit cleaner   Outstanding Labs/Studies   FLP/LFTs in 8 weeks   Duration of Discharge Encounter   Greater than 30 minutes including  physician time.  Signed, Reino Bellis, NP 09/19/2021, 10:35 AM

## 2021-09-19 NOTE — TOC Benefit Eligibility Note (Signed)
Patient Product/process development scientist completed.    The patient is currently admitted and upon discharge could be taking Entresto 24-26 mg.  The current 30 day co-pay is, $40.00.   The patient is currently admitted and upon discharge could be taking Farxiga 10 mg.  The current 30 day co-pay is, $40.00.   The patient is currently admitted and upon discharge could be taking Jardiance 10 mg.  The current 30 day co-pay is, $40.00.   The patient is insured through [employer] Part D     Roland Earl, CPhT Pharmacy Patient Advocate Specialist [facility] Patient Advocate Team Direct Number: [phone]  Fax: [phone]

## 2021-09-20 ENCOUNTER — Encounter (HOSPITAL_COMMUNITY): Payer: Self-pay | Admitting: Cardiology

## 2021-09-20 ENCOUNTER — Ambulatory Visit: Payer: Medicare Other | Admitting: Cardiology

## 2021-09-20 LAB — POCT ACTIVATED CLOTTING TIME
Activated Clotting Time: 215 seconds
Activated Clotting Time: 287 seconds

## 2021-09-27 ENCOUNTER — Telehealth (HOSPITAL_COMMUNITY): Payer: Self-pay

## 2021-09-27 NOTE — Telephone Encounter (Signed)
Pt insurance is active and benefits verified through [facility]. Co-pay $10.00, DED $0.00/$0.00 met, out of pocket $900.00/$70.00 met, co-insurance 0%. No pre-authorization required. Passport, 09/27/21 @ 4:22PM, VRA#[id number]   How many CR sessions are covered? (36 sessions for TCR, 72 sessions for ICR)72 Is this a lifetime maximum or an annual maximum? No Has the member used any of these services to date? N/A Is there a time limit (weeks/months) on start of program and/or program completion? No     Will contact patient to see if he is interested in the Cardiac Rehab Program. If interested, patient will need to complete follow up appt. Once completed, patient will be contacted for scheduling upon review by the RN Navigator.

## 2021-09-27 NOTE — Telephone Encounter (Signed)
Attempted to call patient in regards to Cardiac Rehab - LM on VM 

## 2021-09-28 NOTE — Progress Notes (Signed)
Cardiology Office Note    Date:  10/02/2021   ID:  Joseph Kidd, DOB 1950/08/07, MRN [id number]  PCP:  [name], NP  Cardiologist:  Dr. Burr Soffer Martinique  Chief Complaint  Patient presents with   Follow-up   Edema    Feet.    History of Present Illness:  Joseph Kidd is a 71 y.o. male with PMH of HTN, HLD intolerant to statins, OA and  CAD s/p CABG. He presented to the hospital with inferior STEMI on 07/03/2016. Emergent cardiac catheterization performed on 07/03/16 showed 95% proximal to mid RCA stenosis, 30% ostial to proximal RCA stenosis, 95% proximal LAD, 80% mid LAD, 100% occluded proximal left circumflex with left to left and right-to-left collaterals, 90% ostial OM1 lesion, EF 50-55%. Echocardiogram performed on the same day showed EF 60-65%, mild LVH, dilated aortic root up to 40 mm, mild MR. He underwent 5 vessel CABG with LIMA to LAD, SVG to diagonal, sequential SVG to OM1 and OM 3, SVG to RCA by Dr. Servando Snare on 07/04/2013. Post procedure, the hospital course involved volume overload requiring diuresis and also atrial fibrillation with RVR which converted on IV amiodarone. He was also initiated on Crestor.  In July 28, 2016 he complained of persistent wheezing and beta blocker was discontinued. Lisinopril had been discontinued earlier.  He was maintaining NSR and amiodarone was stopped. He was noted to have right leg swelling. LE venous dopplers demonstrated right peroneal vein DVT. He was started on Xarelto and completed his course of therapy in July. In August 2018 he did undergo left TKR. He was given DVT prophylaxis with Xarelto. In June 2019 he had right TKR. On his last visit he was started on losartan for BP control.  On his last visit he was found to be in Atrial flutter. We obtained an Echo. This showed significant drop in EF from 60-65% to 40-45%. Moderate MR. This led to a right and left heart cath. This demonstrated normal LV filling pressures and mild pulmonary HTN.  Patent LIMA to LAD and sequential SVG to OM x 2, SVG to PDA had a severe stenosis. SVG to diagonal occluded. He underwent successful PCI of the SVG to diagonal. DC on Xarelto and Plavix. Plan for DCCV after back on anticoagulation for 3 weeks. Needs titration of CHF therapy.  On follow up today he has noted since Monday am that he is having black stools. We instructed him to stop Xarelto. He states since then stools have lightened up. Hgb yesterday was 11.9. He feels well. Denies any chest pain or dyspnea. Notes his HR is still running high but otherwise not aware of it. No edema.     Past Medical History:  Diagnosis Date   Coronary artery disease    DVT (deep venous thrombosis) (HCC)    right lower leg   Hypercholesterolemia    Hypertension    Myocardial infarction (HCC)    Osteoarthritis    Osteoarthritis of right knee     Past Surgical History:  Procedure Laterality Date   CARDIAC CATHETERIZATION     CORONARY ARTERY BYPASS GRAFT N/A 07/04/2016   Procedure: CORONARY ARTERY BYPASS GRAFT times five with left internal mammary artery to Left Anterior Descending artery : endoscopic harvest of right saphenous vein with grafts to Diagonal, Right Coronary, Obtuse Marginal 1, Obtuse Marginal 3 Arteries. Insertion of right groin femoral a line;  Surgeon: Grace Isaac, MD;  Location: [place];  Service: Open Heart Surgery;  Laterality:  N/A;   CORONARY STENT INTERVENTION N/A 09/19/2021   Procedure: CORONARY STENT INTERVENTION;  Surgeon: Martinique, Dacotah Cabello M, MD;  Location: [facility] [facility];  Service: Cardiovascular;  Laterality: N/A;   INTRAOPERATIVE TRANSESOPHAGEAL ECHOCARDIOGRAM N/A 07/04/2016   Procedure: INTRAOPERATIVE TRANSESOPHAGEAL ECHOCARDIOGRAM;  Surgeon: Grace Isaac, MD;  Location: [place];  Service: Open Heart Surgery;  Laterality: N/A;   LEFT HEART CATH AND CORONARY ANGIOGRAPHY N/A 07/03/2016   Procedure: Left Heart Cath and Coronary Angiography;  Surgeon: Jarika Robben M Martinique, MD;  Location: [place] [facility];  Service: Cardiovascular;  Laterality: N/A;   RIGHT/LEFT HEART CATH AND CORONARY/GRAFT ANGIOGRAPHY N/A 09/19/2021   Procedure: RIGHT/LEFT HEART CATH AND CORONARY/GRAFT ANGIOGRAPHY;  Surgeon: Martinique, Jailene Cupit M, MD;  Location: [facility];  Service: Cardiovascular;  Laterality: N/A;   TOTAL KNEE ARTHROPLASTY Left 11/25/2016   Procedure: LEFT TOTAL KNEE ARTHROPLASTY;  Surgeon: Garald Balding, MD;  Location: [place];  Service: Orthopedics;  Laterality: Left;   TOTAL KNEE ARTHROPLASTY Right 10/13/2017   Procedure: RIGHT TOTAL KNEE ARTHROPLASTY;  Surgeon: Garald Balding, MD;  Location: [place];  Service: Orthopedics;  Laterality: Right;  FEMORAL NERVE BLOCK    Current Medications: Outpatient Medications Prior to Visit  Medication Sig Dispense Refill   aspirin EC 81 MG tablet Take 1 tablet (81 mg total) by mouth daily. Swallow whole. 30 tablet 0   clopidogrel (PLAVIX) 75 MG tablet Take 1 tablet (75 mg total) by mouth daily. 90 tablet 1   diltiazem (CARDIZEM CD) 240 MG 24 hr capsule Take 1 capsule (240 mg total) by mouth daily. 90 capsule 3   doxazosin (CARDURA) 2 MG tablet Take 2 mg at bedtime 90 tablet 3   ezetimibe (ZETIA) 10 MG tablet TAKE 1 TABLET BY MOUTH EVERY DAY 90 tablet 1   losartan (COZAAR) 100 MG tablet TAKE 1 TABLET BY MOUTH EVERY DAY. 30 tablet 0   nitroGLYCERIN (NITROSTAT) 0.4 MG SL tablet Place 1 tablet (0.4 mg total) under the tongue every 5 (five) minutes as needed for chest pain. 25 tablet 1   rivaroxaban (XARELTO) 20 MG TABS tablet Take 1 tablet (20 mg total) by mouth daily with supper. 30 tablet 6   rosuvastatin (CRESTOR) 40 MG tablet Take 1 tablet (40 mg total) by mouth daily. 90 tablet 3   No facility-administered medications prior to visit.     Allergies:   Atorvastatin, Oxycodone-acetaminophen, and Chlorhexidine   Social History   Socioeconomic History   Marital status: Married    Spouse name: Not on file   Number of children: 1   Years of  education: Not on file   Highest education level: Not on file  Occupational History   Occupation: retired Clinical cytogeneticist  Tobacco Use   Smoking status: Never   Smokeless tobacco: Never  Vaping Use   Vaping Use: Never used  Substance and Sexual Activity   Alcohol use: Yes    Comment: very seldom   Drug use: No   Sexual activity: Not on file  Other Topics Concern   Not on file  Social History Narrative   Not on file   Social Determinants of Health   Financial Resource Strain: Not on file  Food Insecurity: Not on file  Transportation Needs: Not on file  Physical Activity: Not on file  Stress: Not on file  Social Connections: Not on file     Family History:  The patient's family history includes Heart disease in his mother; Hypercholesterolemia in his  mother.   ROS:   Please see the history of present illness.    ROS All other systems reviewed and are negative.   PHYSICAL EXAM:   VS:  BP (!) 158/110 (BP Location: Left Arm, Patient Position: Sitting, Cuff Size: Large)   Pulse (!) 114   Ht 5\' 11"  (1.803 m)   Wt 266 lb (120.7 kg)   BMI 37.10 kg/m    GENERAL:  Well appearing obese WM in NAD HEENT:  PERRL, EOMI, sclera are clear. Oropharynx is clear. NECK:  No jugular venous distention, carotid upstroke brisk and symmetric, no bruits, no thyromegaly or adenopathy LUNGS:  Clear to auscultation bilaterally CHEST:  Unremarkable HEART:  tachy RRR,  PMI not displaced or sustained,S1 and S2 within normal limits, no S3, no S4: no clicks, no rubs, no murmurs ABD:  Soft, nontender. BS +, no masses or bruits. No hepatomegaly, no splenomegaly EXT:  2 + pulses throughout, tr edema, no cyanosis no clubbing SKIN:  Warm and dry.  No rashes NEURO:  Alert and oriented x 3. Cranial nerves II through XII intact. PSYCH:  Cognitively intact   Wt Readings from Last 3 Encounters:  10/02/21 266 lb (120.7 kg)  09/19/21 276 lb (125.2 kg)  08/21/21 276 lb 6.4 oz (125.4 kg)      Studies/Labs  Reviewed:   EKG:  EKG is ordered today. Atrial flutter with RVR rate 114. ST-T changes c/w inferolateral ischemia.  I have personally reviewed and interpreted this study.   Recent Labs: 08/21/2021: ALT 23; TSH 2.240 09/12/2021: BUN 22; Creatinine, Ser 1.14 09/19/2021: Potassium 3.5; Sodium 137 10/01/2021: Hemoglobin 11.9; Platelets 210   Lipid Panel    Component Value Date/Time   CHOL 113 08/21/2021 1210   TRIG 68 08/21/2021 1210   HDL 28 (L) 08/21/2021 1210   CHOLHDL 4.0 08/21/2021 1210   CHOLHDL 4.3 03/02/2017 0908   VLDL 19 08/26/2016 1236   LDLCALC 71 08/21/2021 1210   [place] 95 03/02/2017 0908   Labs dated 05/26/17: CBC and CMET normal. Cholesterol 120, triglycerides 26, HDL 35, LDL 74. TSH and PSA normal.  Dated 10/15/17: chemistries normal. Hgb 11.2  Additional studies/ records that were reviewed today include:   Cath 07/03/2016 Conclusion     Prox RCA to Mid RCA lesion, 95 %stenosed. Ost RCA to Prox RCA lesion, 30 %stenosed. Mid RCA lesion, 40 %stenosed. Prox LAD lesion, 95 %stenosed. Mid LAD-2 lesion, 75 %stenosed. Mid LAD-1 lesion, 80 %stenosed. Ost 1st Mrg to 1st Mrg lesion, 90 %stenosed. Prox Cx to Mid Cx lesion, 100 %stenosed. The left ventricular systolic function is normal. LV end diastolic pressure is normal. The left ventricular ejection fraction is 50-55% by visual estimate.   1. Critical 3 vessel obstructive CAD.     - complex 95% proximal LAD, 75-80% diffuse mid LAD    - 90% OM1    - 100% mid LCx with left to left and right to left collaterals    - 95% proximal RCA 2. Good LV function 3. Normal LVEDP   Plan: Patient is currently pain free- will aggressively threat medically with ASA, IV Ntg, IV heparin, beta blocker. Consult CT surgery  for CABG.       Echo 07/03/2016 LV EF: 60% -   65%   ------------------------------------------------------------------- Indications:      Chest pain 786.51.    ------------------------------------------------------------------- History:   Risk factors:  NSTEMI. Hypertension. Dyslipidemia.   ------------------------------------------------------------------- Study Conclusions   - Left ventricle: The cavity size was normal.  Wall thickness was   increased in a pattern of mild LVH. Systolic function was normal.   The estimated ejection fraction was in the range of 60% to 65%.   Wall motion was normal; there were no regional wall motion   abnormalities. Left ventricular diastolic function parameters   were normal. - Aortic valve: Mildly calcified annulus. There was trivial   regurgitation. - Aorta: Mild aortic root dilatation. Aortic root dimension: 40 mm   (ED). - Mitral valve: There was mild regurgitation.    CABG x 5 by Dr. Servando Snare 07/04/2016 PRE-OPERATIVE DIAGNOSIS:  1. S/p NSTEMI 2. Coronary artery disease   POST-OPERATIVE DIAGNOSIS:  1. S/p NSTEMI 2. Coronary artery disease   PROCEDURE: INTRAOPERATIVE TRANSESOPHAGEAL ECHOCARDIOGRAM, MEDIAN STERNOTOMY for CORONARY ARTERY BYPASS GRAFT x 5 (LIMA to LAD, SVG to DIAGONAL, SVG SEQUENTIALLY to OM1 and OM3, and SVG to RCA) with  endoscopic harvest of right greater saphenous vein with grafts to    Echo 09/05/21: IMPRESSIONS     1. Left ventricular ejection fraction, by estimation, is 40 to 45%. The  left ventricle has mildly decreased function. The left ventricle  demonstrates regional wall motion abnormalities with basal inferior  akinesis, inferolateral severe hypokinesis, and  basal anterolateral akinesis. The left ventricular internal cavity size  was mildly dilated. There is mild left ventricular hypertrophy. Left  ventricular diastolic parameters are indeterminate. The average left  ventricular global longitudinal strain is  -12.4 %. The global longitudinal strain is abnormal.   2. Right ventricular systolic function is mildly reduced. The right  ventricular size is mildly enlarged.  There is moderately elevated  pulmonary artery systolic pressure. The estimated right ventricular  systolic pressure is 123XX123 mmHg.   3. Left atrial size was severely dilated.   4. Right atrial size was moderately dilated.   5. The mitral valve is abnormal. Moderate mitral valve regurgitation,  suspect infarct-related MR with inferior/lateral wall motion  abnormalities. No evidence of mitral stenosis. Moderate mitral annular  calcification with some mobile calcification at the   annulus.   6. The aortic valve is tricuspid. There is mild calcification of the  aortic valve. Aortic valve regurgitation is moderate. No aortic stenosis  is present.   7. Aortic dilatation noted. There is moderate dilatation of the aortic  root and of the ascending aorta, measuring 46 mm.   8. The inferior vena cava is dilated in size with >50% respiratory  variability, suggesting right atrial pressure of 8 mmHg.   9. The patient is in atrial flutter.   Comparison(s): 07/03/16 EF 60-65%.   Cardiac cath 09/17/21:  CORONARY STENT INTERVENTION  RIGHT/LEFT HEART CATH AND CORONARY/GRAFT ANGIOGRAPHY   Conclusion      Prox RCA lesion is 95% stenosed.   Mid RCA lesion is 100% stenosed.   Prox LAD lesion is 90% stenosed with 90% stenosed side branch in 1st Diag.   1st Diag lesion is 90% stenosed.   Mid LAD lesion is 100% stenosed.   Ost Cx to Prox Cx lesion is 100% stenosed.   Origin to Prox Graft lesion is 75% stenosed.   A drug-eluting stent was successfully placed using a SYNERGY XD [id number].   Post intervention, there is a 0% residual stenosis.   Origin lesion is 100% stenosed.   1st Mrg lesion is 80% stenosed.   SVG graft was visualized by angiography and is normal in caliber.   SVG graft was visualized by angiography.   Seq SVG- OM1 and OM2 graft was visualized  by angiography and is large.   LIMA graft was visualized by angiography and is normal in caliber.   The graft exhibits no disease.   The graft  exhibits no disease.   LV end diastolic pressure is normal.   Hemodynamic findings consistent with mild pulmonary hypertension.   Severe 3 vessel occlusive CAD Patent LIMA to the LAD Occluded SVG to the diagonal. The diagonal is a large branch but is poorly suited for PCI due to acute angulation, aneurysmal disease and heavy calcification Patent sequential SVG to OM1 and OM2. There is moderate stenosis in the OM1 just distal to the anastomosis. The OM1 is relatively small Patent SVG to RCA with 75% stenosis in the proximal SVG Normal LV filling pressures. EDP 14 mm Hg Mild pulmonary HTN. PAP mean 29 mm Hg Good cardiac output with index 3.79.  Successful PCI of the SVG to RCA with DES x 1   Plan: recommend resuming Xarelto this pm. ASA 81 mg for one month. Plavix 75 mg for 6 months. Anticipate same day DC. Will need to treat residual disease in the first diagonal and OM1 medically. Anticipate DCCV for atrial flutter after resumption of Xarelto in 3-4 weeks. Need to work on optimizing medical therapy for CHF. Consider switching to [facility] as outpatient, SGLT2, and/or aldactone. Will need to assess cost of medication adjustments.   Diagrams  Diagnostic Dominance: Right Intervention  Implants       ASSESSMENT:    1. Coronary artery disease involving coronary bypass graft of native heart without angina pectoris   2. Blood in stool   3. Essential hypertension   4. S/P CABG x 5   5. Hypercholesterolemia   6. Typical atrial flutter ([place])   7. Chronic systolic CHF (congestive heart failure) (HCC)      PLAN:  In order of problems listed above:  1. CAD s/p CABG in 2015: Recent cardiac cath showed occluded SVG to diagonal. Severe stenosis in SVG to RCA which was successfully stented. Patent LIMA to LAD, SVG to OMs. Will Continue DAPT for now with ASA and Plavix. If/when we can resume Xarelto then will DC ASA and  Continue Plavix for 6 months.   2. Chronic systolic CHF. EF 40-45%.  Intolerant of beta blockers. Ideally would like to get off diltiazem but need this for rate control currently. He is on losartan. Would like to optimize CHF therapy with Entresto/SGLT 2 inhibitor and aldactone but need to make sure affordable. Will arrange appointment with our pharm D to assist.   3. HTN: controlled.   4. HLD: Intolerant to Lipitor as before, he was started on Crestor 5 mg daily, then increased to 20 mg daily. Tolerating well. Now on Zetia. LDL at goal 70.   5. Severe osteoarthritis knees. S/p left and right TKR.   6. Morbid obesity. Stressed importance of calorie and carb restriction and increased aerobic activity.   7. Atrial flutter - new onset from last year. No symptoms. Unclear duration. Rate elevated. Continue Diltiazem. Unable to tolerate beta blocker. Will initiate amiodarone for rate control at least until we can arrange DCCV.  Mali Vasc score is 3. Unfortunately having some GI bleed on Xarelto with antiplatelet therapy. Will need to hold Xarelto for now. GI consult. Suspect he is having some UGI bleeding. Start Protonix 40 mg daily. Will need to hold off on anticoagulation until cleared by GI. As a result will need to postpone DCCV  8. GI bleed base on history  Medication Adjustments/Labs and Tests Ordered: Current medicines are reviewed at length with the patient today.  Concerns regarding medicines are outlined above.  Medication changes, Labs and Tests ordered today are listed in the Patient Instructions below. There are no Patient Instructions on file for this visit.    Signed, Wakisha Alberts Martinique, MD  10/02/2021 10:46 AM    [facility] [address], [place], [place]  [address] Phone: [phone]; Fax: [phone]

## 2021-10-01 ENCOUNTER — Telehealth: Payer: Self-pay | Admitting: Cardiology

## 2021-10-01 DIAGNOSIS — K921 Melena: Secondary | ICD-10-CM

## 2021-10-01 LAB — CBC
Hematocrit: 37 % — ABNORMAL LOW (ref 37.5–51.0)
Hemoglobin: 11.9 g/dL — ABNORMAL LOW (ref 13.0–17.7)
MCH: 27.4 pg (ref 26.6–33.0)
MCHC: 32.2 g/dL (ref 31.5–35.7)
MCV: 85 fL (ref 79–97)
Platelets: 210 10*3/uL (ref 150–450)
RBC: 4.34 x10E6/uL (ref 4.14–5.80)
RDW: 14.3 % (ref 11.6–15.4)
WBC: 5.2 10*3/uL (ref 3.4–10.8)

## 2021-10-01 NOTE — Telephone Encounter (Signed)
Spoke with patient of Dr. Martinique who reports dark tarry stools since yesterday. No other source of bleeding.   He recently had heart cath/stent -- started on ASA/plavix He is also on xarelto   He does not a GI MD  Will route to MD/PharmD to advise

## 2021-10-01 NOTE — Telephone Encounter (Signed)
Patient is calling stating he has blood in his stool that has been occurring since yesterday. He is requesting a callback to discuss what he should do.

## 2021-10-01 NOTE — Telephone Encounter (Signed)
I think we need to hold Xarelto. Continue DAPT. He needs to have CBC done. His Xarelto was held for cath a week ago so we aren't losing a lot of time if able to proceed with DCCV but need to make sure he isn't having significant bleed.   Peter Swaziland MD, [facility]    Spoke with patient and relayed MD advice. He has not taken Xarelto since Sunday evening. He will hold tonight also. He will come by [facility] office for CBC (ordered STAT so that results will be available for appointment tomorrow).

## 2021-10-01 NOTE — Telephone Encounter (Signed)
This is tough because if Dr Swaziland is planning on a cardioversion in the upcoming weeks then patient can not miss any anticoagulation. Otherwise could suggest holding. Has appointment tomorrow with Dr Swaziland.

## 2021-10-02 ENCOUNTER — Encounter: Payer: Self-pay | Admitting: Cardiology

## 2021-10-02 ENCOUNTER — Ambulatory Visit (INDEPENDENT_AMBULATORY_CARE_PROVIDER_SITE_OTHER): Payer: Medicare Other | Admitting: Cardiology

## 2021-10-02 VITALS — BP 158/110 | HR 114 | Ht 71.0 in | Wt 266.0 lb

## 2021-10-02 DIAGNOSIS — I10 Essential (primary) hypertension: Secondary | ICD-10-CM | POA: Diagnosis not present

## 2021-10-02 DIAGNOSIS — Z951 Presence of aortocoronary bypass graft: Secondary | ICD-10-CM

## 2021-10-02 DIAGNOSIS — I25810 Atherosclerosis of coronary artery bypass graft(s) without angina pectoris: Secondary | ICD-10-CM

## 2021-10-02 DIAGNOSIS — E7800 Pure hypercholesterolemia, unspecified: Secondary | ICD-10-CM

## 2021-10-02 DIAGNOSIS — K921 Melena: Secondary | ICD-10-CM | POA: Diagnosis not present

## 2021-10-02 DIAGNOSIS — I5022 Chronic systolic (congestive) heart failure: Secondary | ICD-10-CM

## 2021-10-02 DIAGNOSIS — I483 Typical atrial flutter: Secondary | ICD-10-CM

## 2021-10-02 MED ORDER — PANTOPRAZOLE SODIUM 40 MG PO TBEC: 40.0000 mg | DELAYED_RELEASE_TABLET | Freq: Every day | ORAL | 11 refills | Status: DC

## 2021-10-02 MED ORDER — AMIODARONE HCL 200 MG PO TABS: 400.0000 mg | ORAL_TABLET | Freq: Every day | ORAL | 3 refills | Status: DC

## 2021-10-02 NOTE — Patient Instructions (Addendum)
Medication Instructions:  START pantoprazole 40mg  daily START amiodarone 400mg  daily (2-200mg  tablets) CONTINUE to HOLD xarelto   *If you need a refill on your cardiac medications before your next appointment, please call your pharmacy*   Follow-Up: At [facility], you and your health needs are our priority.  As part of our continuing mission to provide you with exceptional heart care, we have created designated Provider Care Teams.  These Care Teams include your primary Cardiologist (physician) and Advanced Practice Providers (APPs -  Physician Assistants and Nurse Practitioners) who all work together to provide you with the care you need, when you need it.  We recommend signing up for the patient portal called "MyChart".  Sign up information is provided on this After Visit Summary.  MyChart is used to connect with patients for Virtual Visits (Telemedicine).  Patients are able to view lab/test results, encounter notes, upcoming appointments, etc.  Non-urgent messages can be sent to your provider as well.   To learn more about what you can do with MyChart, go to [email or web address].    Your next appointment:    Clinical Pharmacy visit to discuss heart failure medication management   Gastroenterologist -- [facility] will call you to schedule an appointment   1 month with Dr. Martinique in the office w/EKG

## 2021-10-03 ENCOUNTER — Other Ambulatory Visit: Payer: Self-pay | Admitting: Cardiology

## 2021-10-04 ENCOUNTER — Telehealth: Payer: Self-pay | Admitting: Cardiology

## 2021-10-04 NOTE — Telephone Encounter (Signed)
New Message:     Patient wants Dr Swaziland to know that he did not see the GI doctor, He said the bleeding stopped after he stopped taking the Xarelto.  He started bleeding on Monday and by Thursday morning had no bleeding, He have not had any bleeding since Wednesday.

## 2021-10-04 NOTE — Telephone Encounter (Signed)
Called pt. He states he has not taken Xarelto since Sunday night. Thursday morning the bleeding stopped. He wants to know since he is off Xarelto currently can he have some dental work done. It will be 1 extraction. Will get message to Dr. Martinique for review.

## 2021-10-07 NOTE — Telephone Encounter (Signed)
Called pt to relay Dr. Elvis Coil message. No answer, left a detailed message for pt. Told him to return call if he has questions.

## 2021-10-25 ENCOUNTER — Other Ambulatory Visit: Payer: Self-pay | Admitting: Cardiology

## 2021-11-05 NOTE — Progress Notes (Signed)
Cardiology Office Note    Date:  11/11/2021   ID:  Joseph Kidd, DOB 1950-10-23, MRN [id number]  PCP:  [name], NP  Cardiologist:  Dr. Terry Abila Martinique  No chief complaint on file.   History of Present Illness:  Joseph Kidd is a 71 y.o. male with PMH of HTN, HLD intolerant to statins, OA and  CAD s/p CABG. He presented to the hospital with inferior STEMI on 07/03/2016. Emergent cardiac catheterization performed on 07/03/16 showed 95% proximal to mid RCA stenosis, 30% ostial to proximal RCA stenosis, 95% proximal LAD, 80% mid LAD, 100% occluded proximal left circumflex with left to left and right-to-left collaterals, 90% ostial OM1 lesion, EF 50-55%. Echocardiogram performed on the same day showed EF 60-65%, mild LVH, dilated aortic root up to 40 mm, mild MR. He underwent 5 vessel CABG with LIMA to LAD, SVG to diagonal, sequential SVG to OM1 and OM 3, SVG to RCA by Dr. Servando Snare on 07/04/2013. Post procedure, the hospital course involved volume overload requiring diuresis and also atrial fibrillation with RVR which converted on IV amiodarone. He was also initiated on Crestor.  In July 28, 2016 he complained of persistent wheezing and beta blocker was discontinued. Lisinopril had been discontinued earlier.  He was maintaining NSR and amiodarone was stopped. He was noted to have right leg swelling. LE venous dopplers demonstrated right peroneal vein DVT. He was started on Xarelto and completed his course of therapy in July. In August 2018 he did undergo left TKR. He was given DVT prophylaxis with Xarelto. In June 2019 he had right TKR. On his last visit he was started on losartan for BP control.  In April he was found to be in Atrial flutter. We obtained an Echo. This showed significant drop in EF from 60-65% to 40-45%. Moderate MR. This led to a right and left heart cath. This demonstrated normal LV filling pressures and mild pulmonary HTN. Patent LIMA to LAD and sequential SVG to OM x 2, SVG to  PDA had a severe stenosis. SVG to diagonal occluded. He underwent successful PCI of the SVG to diagonal. DC on Xarelto and Plavix. Plan for DCCV after back on anticoagulation for 3 weeks. Needs titration of CHF therapy.  When seen in June he  noted  he was having black stools. We instructed him to stop Xarelto. He states since then his stools have been normal. He did not follow up with GI. He was started on amiodarone 400 mg daily on June 7. He reports this makes him feel terrible.  Denies any chest pain or dyspnea. HR has improved. Notes BP is still running high especially in am.    Past Medical History:  Diagnosis Date   Coronary artery disease    DVT (deep venous thrombosis) (HCC)    right lower leg   Hypercholesterolemia    Hypertension    Myocardial infarction ([name])    Osteoarthritis    Osteoarthritis of right knee     Past Surgical History:  Procedure Laterality Date   CARDIAC CATHETERIZATION     CORONARY ARTERY BYPASS GRAFT N/A 07/04/2016   Procedure: CORONARY ARTERY BYPASS GRAFT times five with left internal mammary artery to Left Anterior Descending artery : endoscopic harvest of right saphenous vein with grafts to Diagonal, Right Coronary, Obtuse Marginal 1, Obtuse Marginal 3 Arteries. Insertion of right groin femoral a line;  Surgeon: Grace Isaac, MD;  Location: [facility];  Service: Open Heart Surgery;  Laterality: N/A;  CORONARY STENT INTERVENTION N/A 09/19/2021   Procedure: CORONARY STENT INTERVENTION;  Surgeon: Martinique, Matai Carpenito M, MD;  Location: [facility];  Service: Cardiovascular;  Laterality: N/A;   INTRAOPERATIVE TRANSESOPHAGEAL ECHOCARDIOGRAM N/A 07/04/2016   Procedure: INTRAOPERATIVE TRANSESOPHAGEAL ECHOCARDIOGRAM;  Surgeon: Grace Isaac, MD;  Location: [facility];  Service: Open Heart Surgery;  Laterality: N/A;   LEFT HEART CATH AND CORONARY ANGIOGRAPHY N/A 07/03/2016   Procedure: Left Heart Cath and Coronary Angiography;  Surgeon: Allante Whitmire M Martinique, MD;  Location: [facility];  Service: Cardiovascular;  Laterality: N/A;   RIGHT/LEFT HEART CATH AND CORONARY/GRAFT ANGIOGRAPHY N/A 09/19/2021   Procedure: RIGHT/LEFT HEART CATH AND CORONARY/GRAFT ANGIOGRAPHY;  Surgeon: Martinique, Shaketa Serafin M, MD;  Location: [facility];  Service: Cardiovascular;  Laterality: N/A;   TOTAL KNEE ARTHROPLASTY Left 11/25/2016   Procedure: LEFT TOTAL KNEE ARTHROPLASTY;  Surgeon: Garald Balding, MD;  Location: [facility];  Service: Orthopedics;  Laterality: Left;   TOTAL KNEE ARTHROPLASTY Right 10/13/2017   Procedure: RIGHT TOTAL KNEE ARTHROPLASTY;  Surgeon: Garald Balding, MD;  Location: [facility];  Service: Orthopedics;  Laterality: Right;  FEMORAL NERVE BLOCK    Current Medications: Outpatient Medications Prior to Visit  Medication Sig Dispense Refill   clopidogrel (PLAVIX) 75 MG tablet Take 1 tablet (75 mg total) by mouth daily. 90 tablet 1   diltiazem (CARDIZEM CD) 240 MG 24 hr capsule Take 1 capsule (240 mg total) by mouth daily. 90 capsule 3   doxazosin (CARDURA) 2 MG tablet Take 2 mg at bedtime 90 tablet 3   ezetimibe (ZETIA) 10 MG tablet TAKE 1 TABLET BY MOUTH EVERY DAY 90 tablet 1   nitroGLYCERIN (NITROSTAT) 0.4 MG SL tablet Place 1 tablet (0.4 mg total) under the tongue every 5 (five) minutes as needed for chest pain. 25 tablet 1   pantoprazole (PROTONIX) 40 MG tablet Take 1 tablet (40 mg total) by mouth daily. 30 tablet 11   rosuvastatin (CRESTOR) 40 MG tablet Take 1 tablet (40 mg total) by mouth daily. 90 tablet 3   amiodarone (PACERONE) 200 MG tablet Take 2 tablets (400 mg total) by mouth daily. 180 tablet 3   aspirin EC 81 MG tablet Take 1 tablet (81 mg total) by mouth daily. Swallow whole. 30 tablet 0   losartan (COZAAR) 100 MG tablet TAKE 1 TABLET BY MOUTH EVERY DAY 30 tablet 0   rivaroxaban (XARELTO) 20 MG TABS tablet Take 1 tablet (20 mg total) by mouth daily with supper. 30 tablet 6   No facility-administered medications prior to visit.     Allergies:    Atorvastatin, Oxycodone-acetaminophen, and Chlorhexidine   Social History   Socioeconomic History   Marital status: Married    Spouse name: Not on file   Number of children: 1   Years of education: Not on file   Highest education level: Not on file  Occupational History   Occupation: retired Clinical cytogeneticist  Tobacco Use   Smoking status: Never   Smokeless tobacco: Never  Vaping Use   Vaping Use: Never used  Substance and Sexual Activity   Alcohol use: Yes    Comment: very seldom   Drug use: No   Sexual activity: Not on file  Other Topics Concern   Not on file  Social History Narrative   Not on file   Social Determinants of Health   Financial Resource Strain: Not on file  Food Insecurity: Not on file  Transportation Needs: Not on file  Physical  Activity: Not on file  Stress: Not on file  Social Connections: Not on file     Family History:  The patient's family history includes Heart disease in his mother; Hypercholesterolemia in his mother.   ROS:   Please see the history of present illness.    ROS All other systems reviewed and are negative.   PHYSICAL EXAM:   VS:  BP (!) 150/98   Pulse 82   Ht 5\' 11"  (1.803 m)   Wt 263 lb 12.8 oz (119.7 kg)   SpO2 95%   BMI 36.79 kg/m    GENERAL:  Well appearing obese WM in NAD HEENT:  PERRL, EOMI, sclera are clear. Oropharynx is clear. NECK:  No jugular venous distention, carotid upstroke brisk and symmetric, no bruits, no thyromegaly or adenopathy LUNGS:  Clear to auscultation bilaterally CHEST:  Unremarkable HEART:  IRRR,  PMI not displaced or sustained,S1 and S2 within normal limits, no S3, no S4: no clicks, no rubs, no murmurs ABD:  Soft, nontender. BS +, no masses or bruits. No hepatomegaly, no splenomegaly EXT:  2 + pulses throughout, tr edema, no cyanosis no clubbing SKIN:  Warm and dry.  No rashes NEURO:  Alert and oriented x 3. Cranial nerves II through XII intact. PSYCH:  Cognitively intact   Wt Readings from Last  3 Encounters:  11/11/21 263 lb 12.8 oz (119.7 kg)  10/02/21 266 lb (120.7 kg)  09/19/21 276 lb (125.2 kg)      Studies/Labs Reviewed:   EKG:  EKG is not ordered today.    Recent Labs: 08/21/2021: ALT 23; TSH 2.240 09/12/2021: BUN 22; Creatinine, Ser 1.14 09/19/2021: Potassium 3.5; Sodium 137 10/01/2021: Hemoglobin 11.9; Platelets 210   Lipid Panel    Component Value Date/Time   CHOL 113 08/21/2021 1210   TRIG 68 08/21/2021 1210   HDL 28 (L) 08/21/2021 1210   CHOLHDL 4.0 08/21/2021 1210   CHOLHDL 4.3 03/02/2017 0908   VLDL 19 08/26/2016 1236   LDLCALC 71 08/21/2021 1210   LDLCALC 95 03/02/2017 0908   Labs dated 05/26/17: CBC and CMET normal. Cholesterol 120, triglycerides 26, HDL 35, LDL 74. TSH and PSA normal.  Dated 10/15/17: chemistries normal. Hgb 11.2  Additional studies/ records that were reviewed today include:   Cath 07/03/2016 Conclusion     Prox RCA to Mid RCA lesion, 95 %stenosed. Ost RCA to Prox RCA lesion, 30 %stenosed. Mid RCA lesion, 40 %stenosed. Prox LAD lesion, 95 %stenosed. Mid LAD-2 lesion, 75 %stenosed. Mid LAD-1 lesion, 80 %stenosed. Ost 1st Mrg to 1st Mrg lesion, 90 %stenosed. Prox Cx to Mid Cx lesion, 100 %stenosed. The left ventricular systolic function is normal. LV end diastolic pressure is normal. The left ventricular ejection fraction is 50-55% by visual estimate.   1. Critical 3 vessel obstructive CAD.     - complex 95% proximal LAD, 75-80% diffuse mid LAD    - 90% OM1    - 100% mid LCx with left to left and right to left collaterals    - 95% proximal RCA 2. Good LV function 3. Normal LVEDP   Plan: Patient is currently pain free- will aggressively threat medically with ASA, IV Ntg, IV heparin, beta blocker. Consult CT surgery  for CABG.       Echo 07/03/2016 LV EF: 60% -   65%   ------------------------------------------------------------------- Indications:      Chest pain 786.51.    ------------------------------------------------------------------- History:   Risk factors:  NSTEMI. Hypertension. Dyslipidemia.   ------------------------------------------------------------------- Study  Conclusions   - Left ventricle: The cavity size was normal. Wall thickness was   increased in a pattern of mild LVH. Systolic function was normal.   The estimated ejection fraction was in the range of 60% to 65%.   Wall motion was normal; there were no regional wall motion   abnormalities. Left ventricular diastolic function parameters   were normal. - Aortic valve: Mildly calcified annulus. There was trivial   regurgitation. - Aorta: Mild aortic root dilatation. Aortic root dimension: 40 mm   (ED). - Mitral valve: There was mild regurgitation.    CABG x 5 by Dr. Servando Snare 07/04/2016 PRE-OPERATIVE DIAGNOSIS:  1. S/p NSTEMI 2. Coronary artery disease   POST-OPERATIVE DIAGNOSIS:  1. S/p NSTEMI 2. Coronary artery disease   PROCEDURE: INTRAOPERATIVE TRANSESOPHAGEAL ECHOCARDIOGRAM, MEDIAN STERNOTOMY for CORONARY ARTERY BYPASS GRAFT x 5 (LIMA to LAD, SVG to DIAGONAL, SVG SEQUENTIALLY to OM1 and OM3, and SVG to RCA) with  endoscopic harvest of right greater saphenous vein with grafts to    Echo 09/05/21: IMPRESSIONS     1. Left ventricular ejection fraction, by estimation, is 40 to 45%. The  left ventricle has mildly decreased function. The left ventricle  demonstrates regional wall motion abnormalities with basal inferior  akinesis, inferolateral severe hypokinesis, and  basal anterolateral akinesis. The left ventricular internal cavity size  was mildly dilated. There is mild left ventricular hypertrophy. Left  ventricular diastolic parameters are indeterminate. The average left  ventricular global longitudinal strain is  -12.4 %. The global longitudinal strain is abnormal.   2. Right ventricular systolic function is mildly reduced. The right  ventricular size is mildly enlarged.  There is moderately elevated  pulmonary artery systolic pressure. The estimated right ventricular  systolic pressure is 123XX123 mmHg.   3. Left atrial size was severely dilated.   4. Right atrial size was moderately dilated.   5. The mitral valve is abnormal. Moderate mitral valve regurgitation,  suspect infarct-related MR with inferior/lateral wall motion  abnormalities. No evidence of mitral stenosis. Moderate mitral annular  calcification with some mobile calcification at the   annulus.   6. The aortic valve is tricuspid. There is mild calcification of the  aortic valve. Aortic valve regurgitation is moderate. No aortic stenosis  is present.   7. Aortic dilatation noted. There is moderate dilatation of the aortic  root and of the ascending aorta, measuring 46 mm.   8. The inferior vena cava is dilated in size with >50% respiratory  variability, suggesting right atrial pressure of 8 mmHg.   9. The patient is in atrial flutter.   Comparison(s): 07/03/16 EF 60-65%.   Cardiac cath 09/17/21:  CORONARY STENT INTERVENTION  RIGHT/LEFT HEART CATH AND CORONARY/GRAFT ANGIOGRAPHY   Conclusion      Prox RCA lesion is 95% stenosed.   Mid RCA lesion is 100% stenosed.   Prox LAD lesion is 90% stenosed with 90% stenosed side branch in 1st Diag.   1st Diag lesion is 90% stenosed.   Mid LAD lesion is 100% stenosed.   Ost Cx to Prox Cx lesion is 100% stenosed.   Origin to Prox Graft lesion is 75% stenosed.   A drug-eluting stent was successfully placed using a SYNERGY XD [id number].   Post intervention, there is a 0% residual stenosis.   Origin lesion is 100% stenosed.   1st Mrg lesion is 80% stenosed.   SVG graft was visualized by angiography and is normal in caliber.   SVG graft was visualized by  angiography.   Seq SVG- OM1 and OM2 graft was visualized by angiography and is large.   LIMA graft was visualized by angiography and is normal in caliber.   The graft exhibits no disease.   The graft  exhibits no disease.   LV end diastolic pressure is normal.   Hemodynamic findings consistent with mild pulmonary hypertension.   Severe 3 vessel occlusive CAD Patent LIMA to the LAD Occluded SVG to the diagonal. The diagonal is a large branch but is poorly suited for PCI due to acute angulation, aneurysmal disease and heavy calcification Patent sequential SVG to OM1 and OM2. There is moderate stenosis in the OM1 just distal to the anastomosis. The OM1 is relatively small Patent SVG to RCA with 75% stenosis in the proximal SVG Normal LV filling pressures. EDP 14 mm Hg Mild pulmonary HTN. PAP mean 29 mm Hg Good cardiac output with index 3.79.  Successful PCI of the SVG to RCA with DES x 1   Plan: recommend resuming Xarelto this pm. ASA 81 mg for one month. Plavix 75 mg for 6 months. Anticipate same day DC. Will need to treat residual disease in the first diagonal and OM1 medically. Anticipate DCCV for atrial flutter after resumption of Xarelto in 3-4 weeks. Need to work on optimizing medical therapy for CHF. Consider switching to [facility] as outpatient, SGLT2, and/or aldactone. Will need to assess cost of medication adjustments.   Diagrams  Diagnostic Dominance: Right Intervention  Implants       ASSESSMENT:    1. Typical atrial flutter ([place])   2. Coronary artery disease involving coronary bypass graft of native heart without angina pectoris   3. S/P CABG x 5   4. Chronic systolic CHF (congestive heart failure) (HCC)      PLAN:  In order of problems listed above:  1. CAD s/p CABG in 2015: Recent cardiac cath May 23 showed occluded SVG to diagonal. Severe stenosis in SVG to RCA which was successfully stented. Patent LIMA to LAD, SVG to OMs. Recommend continued Plavix. Will try and initiate Eliquis. Stay off ASA.  Anticipate Plavix for 6 months.   2. Chronic systolic CHF. EF 40-45%. Intolerant of beta blockers. Ideally would like to get off diltiazem but need this for rate control  currently. He is on losartan. With BP still running high will switch losartan to Entresto 97/103 mg bid. Consider addition of aldactone in follow up. Could consider SGLT 2 inhibitor as well but need to make sure meds affordable.   3. HTN: see above  4. HLD: Intolerant to Lipitor as before, he was started on Crestor 5 mg daily, then increased to 20 mg daily. Tolerating well. Now on Zetia. LDL at goal 70.   5. Severe osteoarthritis knees. S/p left and right TKR.   6. Morbid obesity. Stressed importance of calorie and carb restriction and increased aerobic activity.   7. Atrial flutter - new onset in April.  Rate controlled on Diltiazem and amiodarone. Unable to tolerate beta blocker. Given side effects on amiodarone will reduce dose to 100 mg daily.  Mali Vasc score is 3. Unfortunately having some GI bleed on Xarelto with dual antiplatelet therapy. Protonix 40 mg daily. Will try Eliquis with plavix monotherapy. If able to tolerate without recurrent bleeding will arrange DCCV after 3 weeks. If he does have recurrent bleeding will need GI evaluation.  8. GI bleed base on history     Medication Adjustments/Labs and Tests Ordered: Current medicines are reviewed at length  with the patient today.  Concerns regarding medicines are outlined above.  Medication changes, Labs and Tests ordered today are listed in the Patient Instructions below. Patient Instructions  Reduce amiodarone to 100 mg daily  Do not take ASA  Start Eliquis 5 mg twice a day  Switch losartan to Entresto 97/103 mg twice a day  Will arrange follow up in 3 weeks and hopefully will be able to proceed with cardioversion.    Signed, Amora Sheehy [place], MD  11/11/2021 9:45 AM    [facility] [address] [place], [place], [place]  [address] Phone: [phone]; Fax: [phone]

## 2021-11-11 ENCOUNTER — Encounter (HOSPITAL_COMMUNITY): Payer: Self-pay

## 2021-11-11 ENCOUNTER — Ambulatory Visit: Payer: Medicare Other | Admitting: Cardiology

## 2021-11-11 ENCOUNTER — Encounter: Payer: Self-pay | Admitting: Cardiology

## 2021-11-11 VITALS — BP 150/98 | HR 82 | Ht 71.0 in | Wt 263.8 lb

## 2021-11-11 DIAGNOSIS — I483 Typical atrial flutter: Secondary | ICD-10-CM | POA: Diagnosis not present

## 2021-11-11 DIAGNOSIS — I25810 Atherosclerosis of coronary artery bypass graft(s) without angina pectoris: Secondary | ICD-10-CM | POA: Diagnosis not present

## 2021-11-11 DIAGNOSIS — I5022 Chronic systolic (congestive) heart failure: Secondary | ICD-10-CM

## 2021-11-11 DIAGNOSIS — Z951 Presence of aortocoronary bypass graft: Secondary | ICD-10-CM | POA: Diagnosis not present

## 2021-11-11 MED ORDER — SACUBITRIL-VALSARTAN 97-103 MG PO TABS: 1.0000 | ORAL_TABLET | Freq: Two times a day (BID) | ORAL | 3 refills | Status: DC

## 2021-11-11 MED ORDER — AMIODARONE HCL 200 MG PO TABS: 100.0000 mg | ORAL_TABLET | Freq: Every day | ORAL | 3 refills | Status: DC

## 2021-11-11 MED ORDER — APIXABAN 5 MG PO TABS: 5.0000 mg | ORAL_TABLET | Freq: Two times a day (BID) | ORAL | 6 refills | Status: DC

## 2021-11-11 NOTE — Patient Instructions (Addendum)
Reduce amiodarone to 100 mg daily  Do not take ASA  Start Eliquis 5 mg twice a day  Switch losartan to Entresto 97/103 mg twice a day  Will arrange follow up in 3 weeks and hopefully will be able to proceed with cardioversion.

## 2021-11-21 ENCOUNTER — Other Ambulatory Visit: Payer: Self-pay | Admitting: Cardiology

## 2021-12-01 NOTE — Progress Notes (Unsigned)
Cardiology Clinic Note   Patient Name: Joseph Kidd Date of Encounter: 12/03/2021  Primary Care Provider:  [name], NP Primary Cardiologist:  Peter Martinique, MD  Patient Profile    NORFLEET Kidd 71 year old male presents the clinic today for follow-up evaluation of his typical atrial flutter.  Past Medical History    Past Medical History:  Diagnosis Date   Coronary artery disease    DVT (deep venous thrombosis) (HCC)    right lower leg   Hypercholesterolemia    Hypertension    Myocardial infarction (HCC)    Osteoarthritis    Osteoarthritis of right knee    Past Surgical History:  Procedure Laterality Date   CARDIAC CATHETERIZATION     CORONARY ARTERY BYPASS GRAFT N/A 07/04/2016   Procedure: CORONARY ARTERY BYPASS GRAFT times five with left internal mammary artery to Left Anterior Descending artery : endoscopic harvest of right saphenous vein with grafts to Diagonal, Right Coronary, Obtuse Marginal 1, Obtuse Marginal 3 Arteries. Insertion of right groin femoral a line;  Surgeon: Grace Isaac, MD;  Location: [place];  Service: Open Heart Surgery;  Laterality: N/A;   CORONARY STENT INTERVENTION N/A 09/19/2021   Procedure: CORONARY STENT INTERVENTION;  Surgeon: Martinique, Peter M, MD;  Location: [place] [facility];  Service: Cardiovascular;  Laterality: N/A;   INTRAOPERATIVE TRANSESOPHAGEAL ECHOCARDIOGRAM N/A 07/04/2016   Procedure: INTRAOPERATIVE TRANSESOPHAGEAL ECHOCARDIOGRAM;  Surgeon: Grace Isaac, MD;  Location: [place];  Service: Open Heart Surgery;  Laterality: N/A;   LEFT HEART CATH AND CORONARY ANGIOGRAPHY N/A 07/03/2016   Procedure: Left Heart Cath and Coronary Angiography;  Surgeon: Peter M Martinique, MD;  Location: [place] [facility];  Service: Cardiovascular;  Laterality: N/A;   RIGHT/LEFT HEART CATH AND CORONARY/GRAFT ANGIOGRAPHY N/A 09/19/2021   Procedure: RIGHT/LEFT HEART CATH AND CORONARY/GRAFT ANGIOGRAPHY;  Surgeon: Martinique, Peter M, MD;  Location: [place]  [facility];  Service: Cardiovascular;  Laterality: N/A;   TOTAL KNEE ARTHROPLASTY Left 11/25/2016   Procedure: LEFT TOTAL KNEE ARTHROPLASTY;  Surgeon: Garald Balding, MD;  Location: [place];  Service: Orthopedics;  Laterality: Left;   TOTAL KNEE ARTHROPLASTY Right 10/13/2017   Procedure: RIGHT TOTAL KNEE ARTHROPLASTY;  Surgeon: Garald Balding, MD;  Location: [place];  Service: Orthopedics;  Laterality: Right;  FEMORAL NERVE BLOCK    Allergies  Allergies  Allergen Reactions   Atorvastatin     Severe myalgias   Oxycodone-Acetaminophen Nausea Only   Chlorhexidine Rash    Body wash prior to surgery, not IV start kit cleaner    History of Present Illness    Joseph Kidd has a PMH of coronary artery disease status post CABG x5 by Dr. Servando Snare on 07/04/2013.  PMH also includes hypertension, hyperlipidemia, statin intolerance, OSA on CPAP, and atrial flutter.  On 07/28/2016 he reported persistent wheezing.  His beta-blocker was discontinued.  His lisinopril had been discontinued earlier.  He was maintaining sinus rhythm and amiodarone was discontinued.  He was noted to have right leg swelling.  Lower extremity venous Doppler showed right peroneal vein DVT.  He was started on Xarelto and completed a course of therapy.  8/18 he underwent left total knee replacement.  He was given DVT prophylaxis with Xarelto.  6/19 he underwent right total knee replacement.  On 4/23 he was noted to be in atrial flutter.  His echocardiogram at that time showed an EF dropped from 60-65% to 40-45% and moderate MR.  He underwent right and left  cardiac catheterization which showed normal LV filling pressures and mild pulmonary hypertension.  His LIMA-LAD and SVG-OM x2 were patent.  SVG-PDA had severe stenosis.  His SVG-diagonal was occluded.  He underwent PCI to his SVG-diagonal.  At that time he was placed on Xarelto and Plavix.  He underwent DCCV after he had been on anticoagulation x3 weeks.  In June he was noted to have  black stools.  He was instructed to stop Xarelto.  On follow-up 11/11/2021 he reported normal stool.  He had not followed up with GI.  He was started on amiodarone 400 mg daily 10/02/2021.  He reported that the medication made him feel terrible.  He denied chest pain and dyspnea.  His heart rate had improved.  His blood pressure is elevated in the a.m.  Plan was made for repeat DCCV.  He presents to the clinic today for follow-up evaluation states he felt bad on diltiazem and his blood pressure was 80 systolic.  He stopped taking the diltiazem and reports compliance with his Eliquis, Zebeta, and amiodarone.  We discussed screening tests for amiodarone and he expressed understanding.  His blood pressure in the clinic today is 140/100.  At home his blood pressure has been 125-1 140/1 100-80.  I will start him on diltiazem 120 mg daily.  We will order a chest x-ray and plan follow-up in 2 to 3 months.  Today he denies chest pain, shortness of breath, lower extremity edema, fatigue, palpitations, melena, hematuria, hemoptysis, diaphoresis, weakness, presyncope, syncope, orthopnea, and PND.   Home Medications    Prior to Admission medications   Medication Sig Start Date End Date Taking? Authorizing Provider  amiodarone (PACERONE) 200 MG tablet Take 0.5 tablets (100 mg total) by mouth daily. 11/11/21   Martinique, Peter M, MD  apixaban (ELIQUIS) 5 MG TABS tablet Take 1 tablet (5 mg total) by mouth 2 (two) times daily. 11/11/21   Martinique, Peter M, MD  clopidogrel (PLAVIX) 75 MG tablet Take 1 tablet (75 mg total) by mouth daily. 09/19/21 09/19/22  Martinique, Peter M, MD  diltiazem (CARDIZEM CD) 240 MG 24 hr capsule Take 1 capsule (240 mg total) by mouth daily. 08/27/21   Martinique, Peter M, MD  doxazosin (CARDURA) 2 MG tablet Take 2 mg at bedtime 10/15/20   Martinique, Peter M, MD  ezetimibe (ZETIA) 10 MG tablet TAKE 1 TABLET BY MOUTH EVERY DAY 07/30/21   Martinique, Peter M, MD  nitroGLYCERIN (NITROSTAT) 0.4 MG SL tablet Place 1 tablet  (0.4 mg total) under the tongue every 5 (five) minutes as needed for chest pain. 09/19/21 09/19/22  Martinique, Peter M, MD  pantoprazole (PROTONIX) 40 MG tablet Take 1 tablet (40 mg total) by mouth daily. 10/02/21   Martinique, Peter M, MD  rosuvastatin (CRESTOR) 40 MG tablet Take 1 tablet (40 mg total) by mouth daily. 09/19/21   Martinique, Peter M, MD  sacubitril-valsartan (ENTRESTO) 97-103 MG Take 1 tablet by mouth 2 (two) times daily. 11/11/21   Martinique, Peter M, MD    Family History    Family History  Problem Relation Age of Onset   Heart disease Mother    Hypercholesterolemia Mother    He indicated that his mother is deceased. He indicated that his father is deceased. He indicated that all of his three sisters are alive.  Social History    Social History   Socioeconomic History   Marital status: Married    Spouse name: Not on file   Number of children: 1  Years of education: Not on file   Highest education level: Not on file  Occupational History   Occupation: retired Clinical cytogeneticist  Tobacco Use   Smoking status: Never   Smokeless tobacco: Never  Vaping Use   Vaping Use: Never used  Substance and Sexual Activity   Alcohol use: Yes    Comment: very seldom   Drug use: No   Sexual activity: Not on file  Other Topics Concern   Not on file  Social History Narrative   Not on file   Social Determinants of Health   Financial Resource Strain: Not on file  Food Insecurity: Not on file  Transportation Needs: Not on file  Physical Activity: Not on file  Stress: Not on file  Social Connections: Not on file  Intimate Partner Violence: Not on file     Review of Systems    General:  No chills, fever, night sweats or weight changes.  Cardiovascular:  No chest pain, dyspnea on exertion, edema, orthopnea, palpitations, paroxysmal nocturnal dyspnea. Dermatological: No rash, lesions/masses Respiratory: No cough, dyspnea Urologic: No hematuria, dysuria Abdominal:   No nausea, vomiting, diarrhea,  bright red blood per rectum, melena, or hematemesis Neurologic:  No visual changes, wkns, changes in mental status. All other systems reviewed and are otherwise negative except as noted above.  Physical Exam    VS:  BP (!) 140/100   Pulse (!) 105   Ht '5\' 11"'  (1.803 m)   Wt 266 lb 12.8 oz (121 kg)   SpO2 98%   BMI 37.21 kg/m  , BMI Body mass index is 37.21 kg/m. GEN: Well nourished, well developed, in no acute distress. HEENT: normal. Neck: Supple, no JVD, carotid bruits, or masses. Cardiac: RRR, no murmurs, rubs, or gallops. No clubbing, cyanosis, edema.  Radials/DP/PT 2+ and equal bilaterally.  Respiratory:  Respirations regular and unlabored, clear to auscultation bilaterally. GI: Soft, nontender, nondistended, BS + x 4. MS: no deformity or atrophy. Skin: warm and dry, no rash. Neuro:  Strength and sensation are intact. Psych: Normal affect.  Accessory Clinical Findings    Recent Labs: 08/21/2021: ALT 23; TSH 2.240 09/12/2021: BUN 22; Creatinine, Ser 1.14 09/19/2021: Potassium 3.5; Sodium 137 10/01/2021: Hemoglobin 11.9; Platelets 210   Recent Lipid Panel    Component Value Date/Time   CHOL 113 08/21/2021 1210   TRIG 68 08/21/2021 1210   HDL 28 (L) 08/21/2021 1210   CHOLHDL 4.0 08/21/2021 1210   CHOLHDL 4.3 03/02/2017 0908   VLDL 19 08/26/2016 1236   LDLCALC 71 08/21/2021 1210   LDLCALC 95 03/02/2017 0908    ECG personally reviewed by me today-sinus tachycardia 105 bpm nonspecific ST and T T wave abnormality wave- No acute changes  Echocardiogram 09/05/2021  IMPRESSIONS     1. Left ventricular ejection fraction, by estimation, is 40 to 45%. The  left ventricle has mildly decreased function. The left ventricle  demonstrates regional wall motion abnormalities with basal inferior  akinesis, inferolateral severe hypokinesis, and  basal anterolateral akinesis. The left ventricular internal cavity size  was mildly dilated. There is mild left ventricular hypertrophy.  Left  ventricular diastolic parameters are indeterminate. The average left  ventricular global longitudinal strain is  -12.4 %. The global longitudinal strain is abnormal.   2. Right ventricular systolic function is mildly reduced. The right  ventricular size is mildly enlarged. There is moderately elevated  pulmonary artery systolic pressure. The estimated right ventricular  systolic pressure is 45.8 mmHg.   3. Left atrial size was  severely dilated.   4. Right atrial size was moderately dilated.   5. The mitral valve is abnormal. Moderate mitral valve regurgitation,  suspect infarct-related MR with inferior/lateral wall motion  abnormalities. No evidence of mitral stenosis. Moderate mitral annular  calcification with some mobile calcification at the   annulus.   6. The aortic valve is tricuspid. There is mild calcification of the  aortic valve. Aortic valve regurgitation is moderate. No aortic stenosis  is present.   7. Aortic dilatation noted. There is moderate dilatation of the aortic  root and of the ascending aorta, measuring 46 mm.   8. The inferior vena cava is dilated in size with >50% respiratory  variability, suggesting right atrial pressure of 8 mmHg.   9. The patient is in atrial flutter.   Comparison(s): 07/03/16 EF 60-65%.   Assessment & Plan   1.  Atrial flutter-EKG today shows sinus tachycardia and nonspecific ST and T wave abnormality 5 bpm.  Reports compliance with Xarelto.  Denies bleeding issues.  Reports compliance with apixaban and denies bleeding issues.  Patient stopped diltiazem.  TSH normal, recent eye exam, liver function normal Continue  apixaban, amiodarone, Zebeta Start diltiazem 120 mg daily Avoid triggers caffeine, chocolate, EtOH, dehydration etc. This patients CHA2DS2-VASc Score and unadjusted Ischemic Stroke Rate (% per year) is equal to 4.8 % stroke rate/year from a score of 4  Above score calculated as 1 point each if present [CHF, HTN,  Vascular=MI/PAD/Aortic Plaque, Age if 79-74,] Order chest x-ray   Chronic systolic CHF-NYHA class II-3.  Generalized bilateral lower extremity nonpitting edema.   Continue Oretha Milch Daily weights Heart healthy low-sodium diet Increase physical activity as tolerated  Coronary artery disease-denies recent episodes of chest discomfort.  Status post CABG on 07/04/2013 by Dr. Servando Snare. Continue rosuvastatin, nitroglycerin, Heart healthy low-sodium diet-salty 6 given Increase physical activity as tolerated  Essential hypertension-BP today 140/100. Continue Entresto, Cardura, bisoprolol Start diltiazem Heart healthy low-sodium diet-salty 6 given Increase physical activity as tolerated  Hyperlipidemia-LDL 71 on 08/21/2021 Continue rosuvastatin Heart healthy low-sodium high-fiber diet Increase physical activity as tolerated  Disposition: Follow-up with Dr. Martinique in 2-3   Jossie Ng. Jenalyn Girdner NP-C     12/03/2021, 10:46 AM [place] [address] Office [phone] Fax [phone]  Notice: This dictation was prepared with Dragon dictation along with smaller phrase technology. Any transcriptional errors that result from this process are unintentional and may not be corrected upon review.  I spent 14 minutes examining this patient, reviewing medications, and using patient centered shared decision making involving her cardiac care.  Prior to her visit I spent greater than 20 minutes reviewing her past medical history,  medications, and prior cardiac tests.

## 2021-12-03 ENCOUNTER — Encounter: Payer: Self-pay | Admitting: General Practice

## 2021-12-03 ENCOUNTER — Ambulatory Visit: Payer: Medicare Other | Admitting: General Practice

## 2021-12-03 VITALS — BP 140/100 | HR 105 | Ht 71.0 in | Wt 266.8 lb

## 2021-12-03 DIAGNOSIS — I25810 Atherosclerosis of coronary artery bypass graft(s) without angina pectoris: Secondary | ICD-10-CM | POA: Diagnosis not present

## 2021-12-03 DIAGNOSIS — I483 Typical atrial flutter: Secondary | ICD-10-CM

## 2021-12-03 DIAGNOSIS — I5022 Chronic systolic (congestive) heart failure: Secondary | ICD-10-CM

## 2021-12-03 DIAGNOSIS — E7800 Pure hypercholesterolemia, unspecified: Secondary | ICD-10-CM

## 2021-12-03 DIAGNOSIS — I10 Essential (primary) hypertension: Secondary | ICD-10-CM | POA: Diagnosis not present

## 2021-12-03 DIAGNOSIS — Z79899 Other long term (current) drug therapy: Secondary | ICD-10-CM

## 2021-12-03 DIAGNOSIS — Z9189 Other specified personal risk factors, not elsewhere classified: Secondary | ICD-10-CM

## 2021-12-03 MED ORDER — DILTIAZEM HCL ER COATED BEADS 120 MG PO CP24: 120.0000 mg | ORAL_CAPSULE | Freq: Every day | ORAL | 6 refills | Status: DC

## 2021-12-03 NOTE — Patient Instructions (Addendum)
Medication Instructions:  DECREASE DILTIAZEM 120MG  DAILY  *If you need a refill on your cardiac medications before your next appointment, please call your pharmacy*  Lab Work:    NONE      If you have labs (blood work) drawn today and your tests are completely normal, you will receive your results only by: MyChart Message (if you have MyChart) OR  A paper copy in the mail If you have any lab test that is abnormal or we need to change your treatment, we will call you to review the results.  Testing/Procedures:  Chest xray - Your physician has requested that you have a chest xray, is a fast and painless imaging test that uses certain electromagnetic waves to create pictures of the structures in and around your chest. This test can help diagnose and monitor conditions such as pneumonia and other lung issues his will be done at 1)  [facility] [address], [place]. If you should need to call them their phone number is [phone]    2)  [facility]: [address], [place], [phone] [place] [phone]   Special Instructions PLEASE READ AND FOLLOW SALTY 6-ATTACHED-1,800mg  daily  Please try to avoid these triggers: Do not use any products that have nicotine or tobacco in them. These include cigarettes, e-cigarettes, and chewing tobacco. If you need help quitting, ask your doctor. Eat heart-healthy foods. Talk with your doctor about the right eating plan for you. Exercise regularly as told by your doctor. Stay hydrated Do not drink alcohol, Caffeine or chocolate. Lose weight if you are overweight. Do not use drugs, including cannabis    Follow-Up: Your next appointment:  2-3 month(s) In Person with Peter [phone], MD  1  At [facility], you and your health needs are our priority.  As part of our continuing mission to provide you with exceptional heart care, we have created designated Provider Care Teams.  These Care Teams include your primary Cardiologist (physician)  and Advanced Practice Providers (APPs -  Physician Assistants and Nurse Practitioners) who all work together to provide you with the care you need, when you need it.  Important Information About Sugar             6 SALTY THINGS TO AVOID     1,800MG  DAILY

## 2021-12-04 ENCOUNTER — Other Ambulatory Visit: Payer: Self-pay | Admitting: Cardiology

## 2021-12-04 NOTE — Telephone Encounter (Signed)
Patient called in to check on the status of prescription stating the pharmacy advised him to call. Informed patient it was just received from the pharmacy shortly before he called. Patient verbalized understanding, states he is completely out of the medication. Please advise.

## 2021-12-12 ENCOUNTER — Ambulatory Visit: Payer: Medicare Other | Admitting: General Practice

## 2022-01-13 ENCOUNTER — Telehealth (HOSPITAL_COMMUNITY): Payer: Self-pay

## 2022-01-13 NOTE — Telephone Encounter (Signed)
No response from pt regarding CR.  Closed referral.  

## 2022-01-24 ENCOUNTER — Other Ambulatory Visit: Payer: Self-pay | Admitting: Cardiology

## 2022-02-10 ENCOUNTER — Telehealth: Payer: Self-pay | Admitting: Cardiology

## 2022-02-10 NOTE — Telephone Encounter (Signed)
Pt c/o medication issue:  1. Name of Medication: sacubitril-valsartan (ENTRESTO) 97-103 MG clopidogrel (PLAVIX) 75 MG tablet  2. How are you currently taking this medication (dosage and times per day)? As prescribed  3. Are you having a reaction (difficulty breathing--STAT)? No   4. What is your medication issue? Patient is calling stating he went to pick up his Delene Loll and they advised it would be $550, so he would like a callback to discuss an alternative. He also states his Plavix was sent to the wrong pharmacy, [facility] needs [facility]. Please advise.

## 2022-02-10 NOTE — Telephone Encounter (Signed)
Spoke to patient he stated Joseph Kidd cost too much.Stated he will not qualify for patient assistance.I will send message to [place] for advice.

## 2022-02-11 MED ORDER — CANDESARTAN CILEXETIL 8 MG PO TABS: 8.0000 mg | ORAL_TABLET | Freq: Every day | ORAL | 3 refills | Status: DC

## 2022-02-11 NOTE — Telephone Encounter (Signed)
Called patient; left message to call back.

## 2022-02-11 NOTE — Telephone Encounter (Signed)
Follow Up: ° ° ° ° °Patient is retuning your call. °

## 2022-02-11 NOTE — Telephone Encounter (Signed)
Spoke to patient he stated he does not want to apply for patient assistance.He knows he will not be approved.Dr.Jordan advised to stop Entresto and start Atacand 8 mg daily.He will finish Entresto and start taking Atacand 8 mg daily on Saturday.Advised to keep appointment with Dr.Jordan 11/8 at 8:20 am.

## 2022-02-18 ENCOUNTER — Telehealth: Payer: Self-pay | Admitting: Cardiology

## 2022-02-18 NOTE — Telephone Encounter (Signed)
*  STAT* If patient is at the pharmacy, call can be transferred to refill team.   1. Which medications need to be refilled? (please list name of each medication and dose if known) new prescription foir Joseph Kidd- patient wants to go back to [place]  2. Which pharmacy/location (including street and city if local pharmacy) is medication to be sent to? [facility] [place]  3. Do they need a 30 day or 90 day supply? 90 days and refills

## 2022-02-19 NOTE — Progress Notes (Signed)
Cardiology Office Note    Date:  03/05/2022   ID:  Joseph Kidd, DOB 1950/11/07, MRN [id number]  PCP:  [name], NP  Cardiologist:  Dr. Jaynia Fendley Martinique  Chief Complaint  Patient presents with   Coronary Artery Disease   Congestive Heart Failure    History of Present Illness:  Joseph Kidd is a 71 y.o. male with PMH of HTN, HLD intolerant to statins, OA and  CAD s/p CABG. He presented to the hospital with inferior STEMI on 07/03/2016. Emergent cardiac catheterization performed on 07/03/16 showed 95% proximal to mid RCA stenosis, 30% ostial to proximal RCA stenosis, 95% proximal LAD, 80% mid LAD, 100% occluded proximal left circumflex with left to left and right-to-left collaterals, 90% ostial OM1 lesion, EF 50-55%. Echocardiogram performed on the same day showed EF 60-65%, mild LVH, dilated aortic root up to 40 mm, mild MR. He underwent 5 vessel CABG with LIMA to LAD, SVG to diagonal, sequential SVG to OM1 and OM 3, SVG to RCA by Dr. Servando Snare on 07/04/2013. Post procedure, the hospital course involved volume overload requiring diuresis and also atrial fibrillation with RVR which converted on IV amiodarone. He was also initiated on Crestor.  In July 28, 2016 he complained of persistent wheezing and beta blocker was discontinued. Lisinopril had been discontinued earlier.  He was maintaining NSR and amiodarone was stopped. He was noted to have right leg swelling. LE venous dopplers demonstrated right peroneal vein DVT. He was started on Xarelto and completed his course of therapy in July. In August 2018 he did undergo left TKR. He was given DVT prophylaxis with Xarelto. In June 2019 he had right TKR. On his last visit he was started on losartan for BP control.  In April he was found to be in Atrial flutter. We obtained an Echo. This showed significant drop in EF from 60-65% to 40-45%. Moderate MR. This led to a right and left heart cath. This demonstrated normal LV filling pressures and mild  pulmonary HTN. Patent LIMA to LAD and sequential SVG to OM x 2, SVG to PDA had a severe stenosis. SVG to diagonal occluded. He underwent successful PCI of the SVG to diagonal. DC on Xarelto and Plavix. Plan for DCCV after back on anticoagulation for 3 weeks. Needs titration of CHF therapy.  When seen in June he  noted  he was having black stools. We instructed him to stop Xarelto. He states then his stools have been normal. He did not follow up with GI. He was tried on amiodarone but states this made him feel terrible. Did not want to pursue DCCV or ablation since he felt so good. Reports Zbeta caused a HA so he stopped. Reports BP higher in am but typically 120-130 on his monitor. Denies any chest pain or SOB. Did stop Entresto due to cost but did resume it 2 weeks ago.     Past Medical History:  Diagnosis Date   Coronary artery disease    DVT (deep venous thrombosis) (HCC)    right lower leg   Hypercholesterolemia    Hypertension    Myocardial infarction [facility])    Osteoarthritis    Osteoarthritis of right knee     Past Surgical History:  Procedure Laterality Date   CARDIAC CATHETERIZATION     CORONARY ARTERY BYPASS GRAFT N/A 07/04/2016   Procedure: CORONARY ARTERY BYPASS GRAFT times five with left internal mammary artery to Left Anterior Descending artery : endoscopic harvest of right saphenous vein  with grafts to Diagonal, Right Coronary, Obtuse Marginal 1, Obtuse Marginal 3 Arteries. Insertion of right groin femoral a line;  Surgeon: Grace Isaac, MD;  Location: [place];  Service: Open Heart Surgery;  Laterality: N/A;   CORONARY STENT INTERVENTION N/A 09/19/2021   Procedure: CORONARY STENT INTERVENTION;  Surgeon: Martinique, Kevin Space M, MD;  Location: [place] [facility];  Service: Cardiovascular;  Laterality: N/A;   INTRAOPERATIVE TRANSESOPHAGEAL ECHOCARDIOGRAM N/A 07/04/2016   Procedure: INTRAOPERATIVE TRANSESOPHAGEAL ECHOCARDIOGRAM;  Surgeon: Grace Isaac, MD;  Location: [place];  Service:  Open Heart Surgery;  Laterality: N/A;   LEFT HEART CATH AND CORONARY ANGIOGRAPHY N/A 07/03/2016   Procedure: Left Heart Cath and Coronary Angiography;  Surgeon: Shunta Mclaurin M Martinique, MD;  Location: [place] [facility];  Service: Cardiovascular;  Laterality: N/A;   RIGHT/LEFT HEART CATH AND CORONARY/GRAFT ANGIOGRAPHY N/A 09/19/2021   Procedure: RIGHT/LEFT HEART CATH AND CORONARY/GRAFT ANGIOGRAPHY;  Surgeon: Martinique, Latrena Benegas M, MD;  Location: [place] [facility];  Service: Cardiovascular;  Laterality: N/A;   TOTAL KNEE ARTHROPLASTY Left 11/25/2016   Procedure: LEFT TOTAL KNEE ARTHROPLASTY;  Surgeon: Garald Balding, MD;  Location: [place];  Service: Orthopedics;  Laterality: Left;   TOTAL KNEE ARTHROPLASTY Right 10/13/2017   Procedure: RIGHT TOTAL KNEE ARTHROPLASTY;  Surgeon: Garald Balding, MD;  Location: [place];  Service: Orthopedics;  Laterality: Right;  FEMORAL NERVE BLOCK    Current Medications: Outpatient Medications Prior to Visit  Medication Sig Dispense Refill   apixaban (ELIQUIS) 5 MG TABS tablet Take 1 tablet (5 mg total) by mouth 2 (two) times daily. 60 tablet 6   diltiazem (CARDIZEM CD) 120 MG 24 hr capsule Take 1 capsule (120 mg total) by mouth daily. 30 capsule 6   doxazosin (CARDURA) 2 MG tablet TAKE 1 TABLET BY MOUTH EVERY DAY AT BEDTIME 90 tablet 3   ezetimibe (ZETIA) 10 MG tablet TAKE 1 TABLET BY MOUTH EVERY DAY 90 tablet 3   nitroGLYCERIN (NITROSTAT) 0.4 MG SL tablet Place 1 tablet (0.4 mg total) under the tongue every 5 (five) minutes as needed for chest pain. 25 tablet 1   rosuvastatin (CRESTOR) 40 MG tablet Take 1 tablet (40 mg total) by mouth daily. 90 tablet 3   amiodarone (PACERONE) 200 MG tablet Take 0.5 tablets (100 mg total) by mouth daily. 45 tablet 3   bisoprolol (ZEBETA) 5 MG tablet TAKE 1 TABLET (5 MG TOTAL) BY MOUTH DAILY. 90 tablet 2   clopidogrel (PLAVIX) 75 MG tablet Take 1 tablet (75 mg total) by mouth daily. 90 tablet 1   pantoprazole (PROTONIX) 40 MG tablet Take 1  tablet (40 mg total) by mouth daily. 30 tablet 11   sacubitril-valsartan (ENTRESTO) 24-26 MG Take 1 tablet by mouth 2 (two) times daily. 60 tablet    No facility-administered medications prior to visit.     Allergies:   Atorvastatin, Oxycodone-acetaminophen, and Chlorhexidine   Social History   Socioeconomic History   Marital status: Married    Spouse name: Not on file   Number of children: 1   Years of education: Not on file   Highest education level: Not on file  Occupational History   Occupation: retired Clinical cytogeneticist  Tobacco Use   Smoking status: Never   Smokeless tobacco: Never  Vaping Use   Vaping Use: Never used  Substance and Sexual Activity   Alcohol use: Yes    Comment: very seldom   Drug use: No   Sexual activity: Not on file  Other  Topics Concern   Not on file  Social History Narrative   Not on file   Social Determinants of Health   Financial Resource Strain: Not on file  Food Insecurity: Not on file  Transportation Needs: Not on file  Physical Activity: Not on file  Stress: Not on file  Social Connections: Not on file     Family History:  The patient's family history includes Heart disease in his mother; Hypercholesterolemia in his mother.   ROS:   Please see the history of present illness.    ROS All other systems reviewed and are negative.   PHYSICAL EXAM:   VS:  BP (!) 162/92   Pulse (!) 110   Ht 5\' 11"  (1.803 m)   Wt 263 lb 12.8 oz (119.7 kg)   SpO2 96%   BMI 36.79 kg/m    GENERAL:  Well appearing obese WM in NAD HEENT:  PERRL, EOMI, sclera are clear. Oropharynx is clear. NECK:  No jugular venous distention, carotid upstroke brisk and symmetric, no bruits, no thyromegaly or adenopathy LUNGS:  Clear to auscultation bilaterally CHEST:  Unremarkable HEART:  IRRR,  PMI not displaced or sustained,S1 and S2 within normal limits, no S3, no S4: no clicks, no rubs, no murmurs ABD:  Soft, nontender. BS +, no masses or bruits. No hepatomegaly, no  splenomegaly EXT:  2 + pulses throughout, tr edema, no cyanosis no clubbing SKIN:  Warm and dry.  No rashes NEURO:  Alert and oriented x 3. Cranial nerves II through XII intact. PSYCH:  Cognitively intact   Wt Readings from Last 3 Encounters:  03/05/22 263 lb 12.8 oz (119.7 kg)  12/03/21 266 lb 12.8 oz (121 kg)  11/11/21 263 lb 12.8 oz (119.7 kg)      Studies/Labs Reviewed:   EKG:  EKG is not ordered today.    Recent Labs: 08/21/2021: ALT 23; TSH 2.240 09/12/2021: BUN 22; Creatinine, Ser 1.14 09/19/2021: Potassium 3.5; Sodium 137 10/01/2021: Hemoglobin 11.9; Platelets 210   Lipid Panel    Component Value Date/Time   CHOL 113 08/21/2021 1210   TRIG 68 08/21/2021 1210   HDL 28 (L) 08/21/2021 1210   CHOLHDL 4.0 08/21/2021 1210   CHOLHDL 4.3 03/02/2017 0908   VLDL 19 08/26/2016 1236   LDLCALC 71 08/21/2021 1210   LDLCALC 95 03/02/2017 0908   Labs dated 05/26/17: CBC and CMET normal. Cholesterol 120, triglycerides 26, HDL 35, LDL 74. TSH and PSA normal.  Dated 10/15/17: chemistries normal. Hgb 11.2  Additional studies/ records that were reviewed today include:   Cath 07/03/2016 Conclusion     Prox RCA to Mid RCA lesion, 95 %stenosed. Ost RCA to Prox RCA lesion, 30 %stenosed. Mid RCA lesion, 40 %stenosed. Prox LAD lesion, 95 %stenosed. Mid LAD-2 lesion, 75 %stenosed. Mid LAD-1 lesion, 80 %stenosed. Ost 1st Mrg to 1st Mrg lesion, 90 %stenosed. Prox Cx to Mid Cx lesion, 100 %stenosed. The left ventricular systolic function is normal. LV end diastolic pressure is normal. The left ventricular ejection fraction is 50-55% by visual estimate.   1. Critical 3 vessel obstructive CAD.     - complex 95% proximal LAD, 75-80% diffuse mid LAD    - 90% OM1    - 100% mid LCx with left to left and right to left collaterals    - 95% proximal RCA 2. Good LV function 3. Normal LVEDP   Plan: Patient is currently pain free- will aggressively threat medically with ASA, IV Ntg, IV heparin,  beta blocker.  Consult CT surgery  for CABG.       Echo 07/03/2016 LV EF: 60% -   65%   ------------------------------------------------------------------- Indications:      Chest pain 786.51.   ------------------------------------------------------------------- History:   Risk factors:  NSTEMI. Hypertension. Dyslipidemia.   ------------------------------------------------------------------- Study Conclusions   - Left ventricle: The cavity size was normal. Wall thickness was   increased in a pattern of mild LVH. Systolic function was normal.   The estimated ejection fraction was in the range of 60% to 65%.   Wall motion was normal; there were no regional wall motion   abnormalities. Left ventricular diastolic function parameters   were normal. - Aortic valve: Mildly calcified annulus. There was trivial   regurgitation. - Aorta: Mild aortic root dilatation. Aortic root dimension: 40 mm   (ED). - Mitral valve: There was mild regurgitation.    CABG x 5 by Dr. Servando Snare 07/04/2016 PRE-OPERATIVE DIAGNOSIS:  1. S/p NSTEMI 2. Coronary artery disease   POST-OPERATIVE DIAGNOSIS:  1. S/p NSTEMI 2. Coronary artery disease   PROCEDURE: INTRAOPERATIVE TRANSESOPHAGEAL ECHOCARDIOGRAM, MEDIAN STERNOTOMY for CORONARY ARTERY BYPASS GRAFT x 5 (LIMA to LAD, SVG to DIAGONAL, SVG SEQUENTIALLY to OM1 and OM3, and SVG to RCA) with  endoscopic harvest of right greater saphenous vein with grafts to    Echo 09/05/21: IMPRESSIONS     1. Left ventricular ejection fraction, by estimation, is 40 to 45%. The  left ventricle has mildly decreased function. The left ventricle  demonstrates regional wall motion abnormalities with basal inferior  akinesis, inferolateral severe hypokinesis, and  basal anterolateral akinesis. The left ventricular internal cavity size  was mildly dilated. There is mild left ventricular hypertrophy. Left  ventricular diastolic parameters are indeterminate. The average left   ventricular global longitudinal strain is  -12.4 %. The global longitudinal strain is abnormal.   2. Right ventricular systolic function is mildly reduced. The right  ventricular size is mildly enlarged. There is moderately elevated  pulmonary artery systolic pressure. The estimated right ventricular  systolic pressure is 123XX123 mmHg.   3. Left atrial size was severely dilated.   4. Right atrial size was moderately dilated.   5. The mitral valve is abnormal. Moderate mitral valve regurgitation,  suspect infarct-related MR with inferior/lateral wall motion  abnormalities. No evidence of mitral stenosis. Moderate mitral annular  calcification with some mobile calcification at the   annulus.   6. The aortic valve is tricuspid. There is mild calcification of the  aortic valve. Aortic valve regurgitation is moderate. No aortic stenosis  is present.   7. Aortic dilatation noted. There is moderate dilatation of the aortic  root and of the ascending aorta, measuring 46 mm.   8. The inferior vena cava is dilated in size with >50% respiratory  variability, suggesting right atrial pressure of 8 mmHg.   9. The patient is in atrial flutter.   Comparison(s): 07/03/16 EF 60-65%.   Cardiac cath 09/17/21:  CORONARY STENT INTERVENTION  RIGHT/LEFT HEART CATH AND CORONARY/GRAFT ANGIOGRAPHY   Conclusion      Prox RCA lesion is 95% stenosed.   Mid RCA lesion is 100% stenosed.   Prox LAD lesion is 90% stenosed with 90% stenosed side branch in 1st Diag.   1st Diag lesion is 90% stenosed.   Mid LAD lesion is 100% stenosed.   Ost Cx to Prox Cx lesion is 100% stenosed.   Origin to Prox Graft lesion is 75% stenosed.   A drug-eluting stent was successfully placed using  a SYNERGY XD [id number].   Post intervention, there is a 0% residual stenosis.   Origin lesion is 100% stenosed.   1st Mrg lesion is 80% stenosed.   SVG graft was visualized by angiography and is normal in caliber.   SVG graft was visualized  by angiography.   Seq SVG- OM1 and OM2 graft was visualized by angiography and is large.   LIMA graft was visualized by angiography and is normal in caliber.   The graft exhibits no disease.   The graft exhibits no disease.   LV end diastolic pressure is normal.   Hemodynamic findings consistent with mild pulmonary hypertension.   Severe 3 vessel occlusive CAD Patent LIMA to the LAD Occluded SVG to the diagonal. The diagonal is a large branch but is poorly suited for PCI due to acute angulation, aneurysmal disease and heavy calcification Patent sequential SVG to OM1 and OM2. There is moderate stenosis in the OM1 just distal to the anastomosis. The OM1 is relatively small Patent SVG to RCA with 75% stenosis in the proximal SVG Normal LV filling pressures. EDP 14 mm Hg Mild pulmonary HTN. PAP mean 29 mm Hg Good cardiac output with index 3.79.  Successful PCI of the SVG to RCA with DES x 1   Plan: recommend resuming Xarelto this pm. ASA 81 mg for one month. Plavix 75 mg for 6 months. Anticipate same day DC. Will need to treat residual disease in the first diagonal and OM1 medically. Anticipate DCCV for atrial flutter after resumption of Xarelto in 3-4 weeks. Need to work on optimizing medical therapy for CHF. Consider switching to [facility] as outpatient, SGLT2, and/or aldactone. Will need to assess cost of medication adjustments.   Diagrams  Diagnostic Dominance: Right Intervention  Implants       ASSESSMENT:    1. Typical atrial flutter (HCC)   2. Chronic systolic CHF (congestive heart failure) (HCC)   3. Coronary artery disease involving coronary bypass graft of native heart without angina pectoris   4. Hypercholesterolemia   5. Essential hypertension   6. S/P CABG x 5       PLAN:  In order of problems listed above:  1. CAD s/p CABG in 2015: Cardiac cath May 23 showed occluded SVG to diagonal. Severe stenosis in SVG to RCA which was successfully stented. Patent LIMA to LAD,  SVG to OMs. He is now 6 months out so we can stop Plavix at this point which he has already done.  Continue Eliquis. On high dose statin.   2. Chronic systolic CHF. EF 40-45%. Intolerant of beta blockers. On diltiazem for rate control.  Will increase Entresto to 49/51 mg bid. Apply for patient assistance.  Doubt SGLT 2 inhibitor is affordable. Consider repeat Echo on next visit.    3. HTN: BP elevated today but better at home. Will increase Entresto today as above.   4. HLD: Intolerant to Lipitor as before, now on Crestor and Tolerating well. Now on Zetia. LDL at goal 70. Will check lab work today.   5. Severe osteoarthritis knees. S/p left and right TKR.   6. Morbid obesity. Stressed importance of calorie and carb restriction and increased aerobic activity.   7. Atrial flutter - new onset in April.  Rate controlled on Diltiazem. Reported intolerance to amiodarone and beta blocker.  [place] Vasc score is 3. Unfortunately having some GI bleed on Xarelto with dual antiplatelet therapy. Now on eliquis monotherapy.  Does not want to pursue DCCV or ablation.  8. GI bleed based on history. Will follow up CBC. No active bleeding.    Medication Adjustments/Labs and Tests Ordered: Current medicines are reviewed at length with the patient today.  Concerns regarding medicines are outlined above.  Medication changes, Labs and Tests ordered today are listed in the Patient Instructions below. There are no Patient Instructions on file for this visit.    Signed, Deyonte Cadden Martinique, MD  03/05/2022 8:20 AM    [facility] [place], [place], [place]  [address] Phone: [phone]; Fax: [phone]

## 2022-02-20 ENCOUNTER — Telehealth: Payer: Self-pay | Admitting: Cardiology

## 2022-02-20 NOTE — Telephone Encounter (Signed)
See 10/26 telephone note.

## 2022-02-20 NOTE — Telephone Encounter (Signed)
Spoke to patient he stated he talked to his pharmacist and he would like to change Eliquis to Plavix and start back on Entresto.Stated he would be able to afford Entesto if he changed to Plavix.Stated he could not take Candesartan caused fast heart beat.Spoke to Tiburones he advised he needs to continue Eliquis.He can start Entresto 24/26 mg twice a day.He will keep appointment with Dr.Jordan 11/8 to discuss other options.Entresto 24/26 mg samples left at front desk.

## 2022-02-20 NOTE — Telephone Encounter (Signed)
Follow Up:      Patiuent is checking on the status of his Entresto refill. He said also wants to talk to you about another one of his medicine please.

## 2022-02-21 ENCOUNTER — Other Ambulatory Visit: Payer: Self-pay | Admitting: Cardiology

## 2022-03-05 ENCOUNTER — Other Ambulatory Visit: Payer: Self-pay

## 2022-03-05 ENCOUNTER — Ambulatory Visit: Payer: Medicare Other | Attending: Cardiology | Admitting: Cardiology

## 2022-03-05 ENCOUNTER — Encounter: Payer: Self-pay | Admitting: Cardiology

## 2022-03-05 VITALS — BP 162/92 | HR 110 | Ht 71.0 in | Wt 263.8 lb

## 2022-03-05 DIAGNOSIS — Z951 Presence of aortocoronary bypass graft: Secondary | ICD-10-CM

## 2022-03-05 DIAGNOSIS — I483 Typical atrial flutter: Secondary | ICD-10-CM | POA: Diagnosis not present

## 2022-03-05 DIAGNOSIS — I25810 Atherosclerosis of coronary artery bypass graft(s) without angina pectoris: Secondary | ICD-10-CM | POA: Diagnosis not present

## 2022-03-05 DIAGNOSIS — E7800 Pure hypercholesterolemia, unspecified: Secondary | ICD-10-CM | POA: Diagnosis not present

## 2022-03-05 DIAGNOSIS — I10 Essential (primary) hypertension: Secondary | ICD-10-CM

## 2022-03-05 DIAGNOSIS — I5022 Chronic systolic (congestive) heart failure: Secondary | ICD-10-CM | POA: Diagnosis not present

## 2022-03-05 MED ORDER — ENTRESTO 49-51 MG PO TABS: 1.0000 | ORAL_TABLET | Freq: Two times a day (BID) | ORAL | 3 refills | Status: DC

## 2022-03-05 NOTE — Patient Instructions (Signed)
Medication Instructions:  Increase Entresto to 49/51 mg take one tablet twice a day Continue all other medications *If you need a refill on your cardiac medications before your next appointment, please call your pharmacy*   Lab Work: Cbc,cmet,lipid panel today If you have labs (blood work) drawn today and your tests are completely normal, you will receive your results only by: MyChart Message (if you have MyChart) OR A paper copy in the mail If you have any lab test that is abnormal or we need to change your treatment, we will call you to review the results.   Testing/Procedures: None ordered   Follow-Up: At [facility], you and your health needs are our priority.  As part of our continuing mission to provide you with exceptional heart care, we have created designated Provider Care Teams.  These Care Teams include your primary Cardiologist (physician) and Advanced Practice Providers (APPs -  Physician Assistants and Nurse Practitioners) who all work together to provide you with the care you need, when you need it.  We recommend signing up for the patient portal called "MyChart".  Sign up information is provided on this After Visit Summary.  MyChart is used to connect with patients for Virtual Visits (Telemedicine).  Patients are able to view lab/test results, encounter notes, upcoming appointments, etc.  Non-urgent messages can be sent to your provider as well.   To learn more about what you can do with MyChart, go to [email or web address].    Your next appointment:  6 Months   Call in Feb to schedule May appointment    The format for your next appointment: Office   Provider:  Dr.Jordan   Important Information About Sugar

## 2022-03-05 NOTE — Addendum Note (Signed)
Addended by: Neoma Laming on: 03/05/2022 08:34 AM   Modules accepted: Orders

## 2022-03-06 LAB — COMPREHENSIVE METABOLIC PANEL
ALT: 26 IU/L (ref 0–44)
AST: 20 IU/L (ref 0–40)
Albumin/Globulin Ratio: 1.4 (ref 1.2–2.2)
Albumin: 4.3 g/dL (ref 3.8–4.8)
Alkaline Phosphatase: 97 IU/L (ref 44–121)
BUN/Creatinine Ratio: 18 (ref 10–24)
BUN: 21 mg/dL (ref 8–27)
Bilirubin Total: 0.5 mg/dL (ref 0.0–1.2)
CO2: 25 mmol/L (ref 20–29)
Calcium: 8.9 mg/dL (ref 8.6–10.2)
Chloride: 105 mmol/L (ref 96–106)
Creatinine, Ser: 1.16 mg/dL (ref 0.76–1.27)
Globulin, Total: 3.1 g/dL (ref 1.5–4.5)
Glucose: 100 mg/dL — ABNORMAL HIGH (ref 70–99)
Potassium: 4.5 mmol/L (ref 3.5–5.2)
Sodium: 140 mmol/L (ref 134–144)
Total Protein: 7.4 g/dL (ref 6.0–8.5)
eGFR: 67 mL/min/{1.73_m2} (ref >59)

## 2022-03-06 LAB — CBC WITH DIFFERENTIAL/PLATELET
Basophils Absolute: 0 10*3/uL (ref 0.0–0.2)
Basos: 1 %
EOS (ABSOLUTE): 0.1 10*3/uL (ref 0.0–0.4)
Eos: 1 %
Hematocrit: 43.1 % (ref 37.5–51.0)
Hemoglobin: 13.6 g/dL (ref 13.0–17.7)
Immature Grans (Abs): 0 10*3/uL (ref 0.0–0.1)
Immature Granulocytes: 0 %
Lymphocytes Absolute: 1.3 10*3/uL (ref 0.7–3.1)
Lymphs: 28 %
MCH: 27.8 pg (ref 26.6–33.0)
MCHC: 31.6 g/dL (ref 31.5–35.7)
MCV: 88 fL (ref 79–97)
Monocytes Absolute: 0.4 10*3/uL (ref 0.1–0.9)
Monocytes: 8 %
Neutrophils Absolute: 3 10*3/uL (ref 1.4–7.0)
Neutrophils: 62 %
Platelets: 217 10*3/uL (ref 150–450)
RBC: 4.89 x10E6/uL (ref 4.14–5.80)
RDW: 14.5 % (ref 11.6–15.4)
WBC: 4.8 10*3/uL (ref 3.4–10.8)

## 2022-03-06 LAB — LIPID PANEL
Chol/HDL Ratio: 3.6 ratio (ref 0.0–5.0)
Cholesterol, Total: 115 mg/dL (ref 100–199)
HDL: 32 mg/dL — ABNORMAL LOW (ref >39)
LDL Chol Calc (NIH): 71 mg/dL (ref 0–99)
Triglycerides: 52 mg/dL (ref 0–149)
VLDL Cholesterol Cal: 12 mg/dL (ref 5–40)

## 2022-06-16 ENCOUNTER — Other Ambulatory Visit: Payer: Self-pay | Admitting: Cardiology

## 2022-06-16 DIAGNOSIS — I483 Typical atrial flutter: Secondary | ICD-10-CM

## 2022-06-16 NOTE — Telephone Encounter (Signed)
Prescription refill request for Eliquis received. Indication: a flutter Last office visit: 03/05/22 Scr: 1.16 on 03/05/22 Age: 72 Weight: 119kg

## 2022-06-25 ENCOUNTER — Other Ambulatory Visit: Payer: Self-pay | Admitting: General Practice

## 2022-07-16 ENCOUNTER — Other Ambulatory Visit: Payer: Self-pay | Admitting: Cardiology

## 2022-07-17 ENCOUNTER — Telehealth: Payer: Self-pay | Admitting: Cardiology

## 2022-07-17 NOTE — Telephone Encounter (Signed)
*  STAT* If patient is at the pharmacy, call can be transferred to refill team.   1. Which medications need to be refilled? (please list name of each medication and dose if known)   sacubitril-valsartan (ENTRESTO) 49-51 MG   2. Which pharmacy/location (including street and city if local pharmacy) is medication to be sent to?  [id number] - [place], [place] - [address]   3. Do they need a 30 day or 90 day supply?   90 day  Patient states he is completely out of the medication.

## 2022-07-18 MED ORDER — ENTRESTO 49-51 MG PO TABS: 1.0000 | ORAL_TABLET | Freq: Two times a day (BID) | ORAL | 3 refills | Status: DC

## 2022-07-18 NOTE — Telephone Encounter (Signed)
Refill for Joseph Kidd has been [facility] to [facility].

## 2022-07-18 NOTE — Addendum Note (Signed)
Addended by: Gaetano Net on: 07/18/2022 08:14 AM   Modules accepted: Orders

## 2022-08-13 ENCOUNTER — Other Ambulatory Visit: Payer: Self-pay | Admitting: Cardiology

## 2022-09-10 ENCOUNTER — Other Ambulatory Visit: Payer: Self-pay | Admitting: Cardiology

## 2022-11-01 ENCOUNTER — Other Ambulatory Visit: Payer: Self-pay | Admitting: Cardiology

## 2022-11-11 ENCOUNTER — Other Ambulatory Visit: Payer: Self-pay | Admitting: Cardiology

## 2022-11-11 DIAGNOSIS — I483 Typical atrial flutter: Secondary | ICD-10-CM

## 2022-11-11 NOTE — Telephone Encounter (Signed)
Eliquis 5mg  refill request received. Patient is 72 years old, weight-119.7kg, Crea-1.16 on 03/05/22, Diagnosis-Aflutter, and last seen by Dr. [place] on 03/05/22. Dose is appropriate based on dosing criteria. Will send in refill to requested pharmacy.

## 2022-11-24 NOTE — Progress Notes (Unsigned)
Cardiology Clinic Note   Date: 11/25/2022 ID: Joseph Kidd, Joseph Kidd May 19, 1950, MRN [id number]  Primary Cardiologist:  Peter Swaziland, MD  Patient Profile    Joseph Kidd is a 72 y.o. male who presents to the clinic today for routine follow up.     Past medical history significant for: CAD. LHC 07/03/2016 (NSTEMI): Proximal to mid RCA 95%.  Ostial proximal RCA 30%.  Mid RCA 40%.  Proximal LAD 95%.  Mid LAD #1 80%, number to 75%.  Ostial OM1 to OM1 90%.  Proximal to mid LCx 100%.  Critical three-vessel obstructive CAD.  Recommend CTS consult. CABG x 5 07/07/2016: LIMA to LAD, reverse SVG to diagonal, sequential reverse SVG to OM1 and distal LCx, reverse SVG to PDA. R/LHC 09/19/2021 (acute systolic heart failure): Proximal RCA 95%.  Mid RCA 100%.  Proximal LAD 90%.  D1 90%.  Mid LAD 100%.  Ostial to proximal CX 100%.  Origins proximal graft lesion 75%.  Origin lesion 100%.  OM1 80%.  Patent LIMA to LAD.  Occluded SVG to diagonal poorly suited for PCI due to acute angulation, aneurysmal disease and heavy calcification.  Patent sequential SVG to OM1 and OM 2.  Moderate stenosis in OM1 just distal to anastomosis.  Patent SVG to RCA with 75% and proximal SVG.  PCI with DES to SVG to RCA.  Mild pulmonary hypertension.  Good cardiac output. Chronic systolic heart failure. Echo 09/05/2021: EF 40 to 45%.  RWMA with basal inferior akinesis, inferolateral severe hypokinesis and basal anterior lateral akinesis.  Mild LVH.  Diastolic parameters indeterminate.  Mildly reduced RV function.  Mild RVH.  Moderately elevated pulmonary artery systolic pressure.  Severe LAE.  Moderate RAE.  Moderate MR, suspect infarct related to MR with inferior/lateral wall motion abnormalities.  Moderate MAC with mobile calcification at the annulus.  Moderate dilatation of aortic root and ascending aorta 46 mm.  Dilated IVC, RA pressure 8 mmHg.  Patient is in a-flutter. PAF/a-flutter. A-fib onset 07/07/2016 postoperative CABG. A-flutter  onset April 2023. Hypertension. Hyperlipidemia. Lipid panel 03/05/2022: LDL 71, HDL 32, TG 52, total 115. DVT. Venous ultrasound right lower extremity 07/31/2016: Evidence of right lower extremity nonocclusive venous thrombus in one of the paired peroneal veins.  No obvious proximal thrombus.      History of Present Illness    Joseph Kidd was first evaluated by Dr. Swaziland on 07/03/2016 during hospital admission for NSTEMI.  He was found to have critical three-vessel obstructive CAD.  He underwent CABG x 5.  Hospital course included volume overload requiring diuresis and A-fib with RVR converted with IV amiodarone.  Postoperative course complicated by development of DVT right lower extremity and he was started on Xarelto.  On 08/21/2021 patient was seen in the office by Dr. Swaziland for routine follow-up and reported he was doing well.  EKG showed patient was in a-flutter with RVR, rate 111 bpm and ST-T changes consistent with inferolateral ischemia.  Started on Xarelto and scheduled for 4-week follow-up to schedule DCCV if still in a-flutter.  Echo showed EF 40 to 45%, RWMA, moderately elevated PA pressure, BAE, moderate MR suspicious for infarct related to MR with inferior/lateral wall motion abnormalities.  Patient underwent PCI with DES to SVG to RCA.  RHC showed mild pulmonary hypertension and good cardiac output.  Patient was in flutter at that time.  Upon outpatient follow-up patient developed some GI bleeding on triple therapy.  He was switched to Eliquis with Plavix monotherapy plan to schedule DCCV  after 3 weeks.  Upon follow-up with Joseph Fabian, NP patient still in a-flutter.  He declined DCCV as he was feeling the best he had in weeks.  Patient was last seen in the office by Dr. Swaziland on 03/05/2022 for routine follow-up.  Patient has stopped Entresto secondary to cost but restarted it 2 weeks prior to visit.  Plavix was stopped and he was continued on Eliquis.  Entresto was increased.  Patient  continued to decline DCCV or ablation.  Today, patient reports he is doing well. Patient denies shortness of breath or dyspnea on exertion. No chest pain, pressure, or tightness. Denies orthopnea or PND. He reports chronic, stable lower extremity edema that is best in the morning and progresses throughout the day. He generally does not have palpitations but will occasionally feel a fluttering in his chest when he lies down. This is typically brief and resolves on its own. 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 He stays active by doing things with his 64 year old grandson. He reports spot checking BP at home and readings typically vary. Last night it was 112/72 and when it gets that low he feels "washed out." He reports "when it is higher I feel better, like I have more energy, so I like it to be higher."    ROS: All other systems reviewed and are otherwise negative except as noted in History of Present Illness.  Studies Reviewed    EKG Interpretation Date/Time:  Tuesday November 25 2022 09:15:59 EDT Ventricular Rate:  111 PR Interval:    QRS Duration:  114 QT Interval:  382 QTC Calculation: 519 R Axis:   -11  Text Interpretation: Atrial flutter with 2:1 A-V conduction Minimal voltage criteria for LVH, may be normal variant ( Cornell product ) Cannot rule out Inferior infarct , age undetermined When compared with ECG of 19-Sep-2021 09:12, Confirmed by Joseph Kidd [phone]) on 11/25/2022 9:35:56 AM    Risk Assessment/Calculations     CHA2DS2-VASc Score = 3   This indicates a 3.2% annual risk of stroke. The patient's score is based upon: CHF History: 1 HTN History: 1 Diabetes History: 0 Stroke History: 0 Vascular Disease History: 0 Age Score: 1 Gender Score: 0    HYPERTENSION CONTROL Vitals:   11/25/22 0913 11/25/22 1825  BP: (!) 148/102 (!) 140/86    The patient's blood pressure is elevated above target today.  In order to address the patient's elevated BP: Restarted  medication today.           Physical Exam    VS:  BP (!) 140/86 (BP Location: Left Arm, Patient Position: Sitting, Cuff Size: Normal)   Pulse (!) 111   Ht 5\' 11"  (1.803 m)   Wt 269 lb 9.6 oz (122.3 kg)   SpO2 97%   BMI 37.60 kg/m  , BMI Body mass index is 37.6 kg/m.  GEN: Well nourished, well developed, in no acute distress. Neck: No JVD or carotid bruits. Cardiac: Irregular rhythm. No murmurs. No rubs or gallops.   Respiratory:  Respirations regular and unlabored. Clear to auscultation without rales, wheezing or rhonchi. GI: Soft, nontender, nondistended. Extremities: Radials/DP/PT 2+ and equal bilaterally. No clubbing or cyanosis.  Mild bilateral lower extremity edema. Skin: Warm and dry, no rash. Neuro: Strength intact.  Assessment & Plan    CAD.  S/p CABG x 30 June 2016, PCI with DES to SVG to RCA May 2023. Patient denies chest pain, tightness, or pressure.  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  He remains active doing things with his 31 year old grandson.  Continue rosuvastatin, Zetia, as needed SL NTG.  Patient on Eliquis. Chronic systolic heart failure.  Echo May 2023 showed EF 40 to 45%.  Patient reports chronic, stable lower extremity edema that is best in the morning and progresses throughout the day.  Euvolemic and well compensated on exam.  Continue Entresto. PAF/a-flutter.  A-fib onset postoperative CABG March 2018, converted on IV amiodarone.  A-flutter onset April 2023.  Patient continues to decline DCCV or ablation.  Patient generally does not have palpitations but will occasionally feel a fluttering in his chest for brief periods when he lays down at night.  EKG shows a flutter with 2 1 AV conduction, 111 bpm.  Denies spontaneous bleeding concerns.  Patient has not been taking diltiazem for an unknown period of time.  He is unsure why he stopped it, he thinks it may be because it was causing him a headache.  Had a long discussion about why diltiazem is  important for rate control.  He agrees to restart it and contact the office if he feels he is not tolerating it.  Continue Eliquis.  Will check CBC today. Hypertension: BP today 148/102 on intake and 140/86 on my recheck.  Patient denies headaches, dizziness or vision changes. Continue doxazosin, Entresto.  Will restart diltiazem (see #3).  Will check BMP today. Hyperlipidemia.  LDL November 2023 71.  Continue rosuvastatin and Zetia.  Will get repeat lipid panel at patient's request.  Disposition: Diltiazem 120 mg daily.  Lipid panel, BMP, CBC today.  Return in 6 months or sooner as needed.         Signed, Etta Grandchild. Phala Schraeder, DNP, NP-C

## 2022-11-25 ENCOUNTER — Encounter: Payer: Self-pay | Admitting: Student

## 2022-11-25 ENCOUNTER — Ambulatory Visit: Payer: Medicare Other | Attending: Student | Admitting: Student

## 2022-11-25 VITALS — BP 140/86 | HR 111 | Ht 71.0 in | Wt 269.6 lb

## 2022-11-25 DIAGNOSIS — I214 Non-ST elevation (NSTEMI) myocardial infarction: Secondary | ICD-10-CM | POA: Diagnosis not present

## 2022-11-25 DIAGNOSIS — I480 Paroxysmal atrial fibrillation: Secondary | ICD-10-CM | POA: Diagnosis not present

## 2022-11-25 DIAGNOSIS — I10 Essential (primary) hypertension: Secondary | ICD-10-CM

## 2022-11-25 DIAGNOSIS — I4892 Unspecified atrial flutter: Secondary | ICD-10-CM

## 2022-11-25 DIAGNOSIS — I5022 Chronic systolic (congestive) heart failure: Secondary | ICD-10-CM

## 2022-11-25 DIAGNOSIS — I25810 Atherosclerosis of coronary artery bypass graft(s) without angina pectoris: Secondary | ICD-10-CM

## 2022-11-25 DIAGNOSIS — E785 Hyperlipidemia, unspecified: Secondary | ICD-10-CM

## 2022-11-25 LAB — BASIC METABOLIC PANEL
BUN/Creatinine Ratio: 16 (ref 10–24)
BUN: 15 mg/dL (ref 8–27)
CO2: 25 mmol/L (ref 20–29)
Calcium: 9.1 mg/dL (ref 8.6–10.2)
Chloride: 105 mmol/L (ref 96–106)
Creatinine, Ser: 0.96 mg/dL (ref 0.76–1.27)
Glucose: 105 mg/dL — ABNORMAL HIGH (ref 70–99)
Potassium: 4.6 mmol/L (ref 3.5–5.2)
Sodium: 141 mmol/L (ref 134–144)
eGFR: 83 mL/min/{1.73_m2} (ref >59)

## 2022-11-25 LAB — CBC

## 2022-11-25 LAB — LIPID PANEL
Chol/HDL Ratio: 3.8 ratio (ref 0.0–5.0)
Cholesterol, Total: 114 mg/dL (ref 100–199)
HDL: 30 mg/dL — ABNORMAL LOW (ref >39)
LDL Chol Calc (NIH): 70 mg/dL (ref 0–99)
Triglycerides: 62 mg/dL (ref 0–149)
VLDL Cholesterol Cal: 14 mg/dL (ref 5–40)

## 2022-11-25 MED ORDER — DILTIAZEM HCL ER COATED BEADS 120 MG PO CP24: 120.0000 mg | ORAL_CAPSULE | Freq: Every day | ORAL | 3 refills | Status: DC

## 2022-11-25 NOTE — Patient Instructions (Signed)
Medication Instructions:  Your medication has been sent to your pharmacy.  *If you need a refill on your cardiac medications before your next appointment, please call your pharmacy*   Lab Work: Please return for FASTING labs in (BMET, CBC, Lipid)  If you have labs (blood work) drawn today and your tests are completely normal, you will receive your results only by: MyChart Message (if you have MyChart) OR A paper copy in the mail If you have any lab test that is abnormal or we need to change your treatment, we will call you to review the results.   Testing/Procedures: NONE   Follow-Up: At [facility], you and your health needs are our priority.  As part of our continuing mission to provide you with exceptional heart care, we have created designated Provider Care Teams.  These Care Teams include your primary Cardiologist (physician) and Advanced Practice Providers (APPs -  Physician Assistants and Nurse Practitioners) who all work together to provide you with the care you need, when you need it.  We recommend signing up for the patient portal called "MyChart".  Sign up information is provided on this After Visit Summary.  MyChart is used to connect with patients for Virtual Visits (Telemedicine).  Patients are able to view lab/test results, encounter notes, upcoming appointments, etc.  Non-urgent messages can be sent to your provider as well.   To learn more about what you can do with MyChart, go to [email or web address].    Your next appointment:      Provider:   Peter Swaziland, MD

## 2022-12-22 ENCOUNTER — Other Ambulatory Visit: Payer: Self-pay | Admitting: Cardiology

## 2023-01-09 ENCOUNTER — Other Ambulatory Visit: Payer: Self-pay | Admitting: Cardiology

## 2023-06-29 ENCOUNTER — Other Ambulatory Visit: Payer: Self-pay | Admitting: Cardiology

## 2023-06-29 DIAGNOSIS — I483 Typical atrial flutter: Secondary | ICD-10-CM

## 2023-07-01 ENCOUNTER — Other Ambulatory Visit: Payer: Self-pay | Admitting: Cardiology

## 2023-07-01 DIAGNOSIS — I483 Typical atrial flutter: Secondary | ICD-10-CM

## 2023-07-01 MED ORDER — ELIQUIS 5 MG PO TABS
5.0000 mg | ORAL_TABLET | Freq: Two times a day (BID) | ORAL | 1 refills | Status: DC
Start: 2023-07-01 — End: 2023-12-31

## 2023-07-01 MED ORDER — SACUBITRIL-VALSARTAN 49-51 MG PO TABS: 1.0000 | ORAL_TABLET | Freq: Two times a day (BID) | ORAL | 1 refills | Status: DC

## 2023-07-01 NOTE — Telephone Encounter (Signed)
 Pt's medication was sent to pt's pharmacy as requested. Confirmation received.

## 2023-07-01 NOTE — Telephone Encounter (Signed)
*  STAT* If patient is at the pharmacy, call can be transferred to refill team.   1. Which medications need to be refilled? (please list name of each medication and dose if known)   sacubitril-valsartan (ENTRESTO) 49-51 MG  ELIQUIS 5 MG TABS tablet   2. Would you like to learn more about the convenience, safety, & potential cost savings by using the [facility]?    3. Are you open to using the [facility] (Type [facility]. ).   4. Which pharmacy/location (including street and city if local pharmacy) is medication to be sent to?  [id number] - [place], [place] - [address]   5. Do they need a 30 day or 90 day supply?   90 day  Patient stated he is completely out of these medications.

## 2023-07-01 NOTE — Telephone Encounter (Signed)
 Prescription refill request for Eliquis received. Indication: Afib  Last office visit: 10/3022 ([place])  Scr: 0.96 (11/25/22)  Age: 73  Weight: 122.3kg  Appropriate dose. Refill sent.

## 2023-09-25 ENCOUNTER — Other Ambulatory Visit: Payer: Self-pay | Admitting: Cardiology

## 2023-09-28 ENCOUNTER — Other Ambulatory Visit: Payer: Self-pay | Admitting: Cardiology

## 2023-10-09 ENCOUNTER — Other Ambulatory Visit: Payer: Self-pay | Admitting: Cardiology

## 2023-11-22 ENCOUNTER — Other Ambulatory Visit: Payer: Self-pay | Admitting: Cardiology

## 2023-12-26 ENCOUNTER — Other Ambulatory Visit: Payer: Self-pay | Admitting: Cardiology

## 2023-12-26 DIAGNOSIS — I483 Typical atrial flutter: Secondary | ICD-10-CM

## 2023-12-31 ENCOUNTER — Telehealth: Payer: Self-pay

## 2023-12-31 ENCOUNTER — Telehealth: Payer: Self-pay | Admitting: Cardiology

## 2023-12-31 DIAGNOSIS — I483 Typical atrial flutter: Secondary | ICD-10-CM

## 2023-12-31 MED ORDER — APIXABAN 5 MG PO TABS: 5.0000 mg | ORAL_TABLET | Freq: Two times a day (BID) | ORAL | 1 refills | Status: DC

## 2023-12-31 MED ORDER — APIXABAN 5 MG PO TABS: 5.0000 mg | ORAL_TABLET | Freq: Two times a day (BID) | ORAL | 0 refills | Status: AC

## 2023-12-31 NOTE — Telephone Encounter (Signed)
*  STAT* If patient is at the pharmacy, call can be transferred to refill team.     1. Which medications need to be refilled? (please list name of each medication and dose if known) ELIQUIS  5 MG TABS tablet     2. Would you like to learn more about the convenience, safety, & potential cost savings by using the [facility]? No       3. Are you open to using the [facility] (Type [facility]. No     4. Which pharmacy/location (including street and city if local pharmacy) is medication to be sent to?[id number] - [place], [place] - [address]     5. Do they need a 30 day or 90 day supply? 90 day  Pt is out of medication.

## 2023-12-31 NOTE — Telephone Encounter (Signed)
 Spoke to patient I received a message you need medication refills.Stated he only needs Eliquis  refilled.He has enough until his appointment scheduled with Jackee Alberts NP 9/15 at 1:30 pm.Eliquis  refill sent to his pharmacy.

## 2024-01-02 ENCOUNTER — Other Ambulatory Visit: Payer: Self-pay | Admitting: Cardiology

## 2024-01-10 NOTE — Progress Notes (Signed)
 Cardiology Office Note    Patient Name: Joseph Kidd Date of Encounter: 01/11/2024  Primary Care Provider:  Teresa Aldona CROME, NP Primary Cardiologist:  Peter Swaziland, MD Primary Electrophysiologist: None   Past Medical History    Past Medical History:  Diagnosis Date   Coronary artery disease    DVT (deep venous thrombosis) (HCC)    right lower leg   Hypercholesterolemia    Hypertension    Myocardial infarction [facility])    Osteoarthritis    Osteoarthritis of right knee     History of Present Illness  Joseph Kidd is a 73 y.o. male with a PMH of CAD s/p inferior STEMI 07/03/2016 with CABG x 5 (LIMA to LAD, SVG to diagonal, sequential SVG to OM1 and OM 3, SVG) and repeat LHC on 5/202375% showing stenosis of the prox segment, treated with PCI/DES x1 , HTN, HLD chronic combined CHF, PAF (on Eliquis ), DVT who presents today for annual follow-up.  Joseph Kidd has a significant history of CAD dating back to 07/03/2016 when he was admitted for inferior STEMI and underwent CABG x 5 by Dr. Army.  He was diagnosed with AF with RVR following bypass which was also complicated by DVT of the right and was started on Xarelto .  He was seen in follow-up by Dr. Swaziland on 08/21/2021 and reported doing well however EKG showed atrial flutter with RVR patient was started on Xarelto  and scheduled for 4-week DCCV if still in flutter.  2D echo was completed showing EF of 40-45% RWMA and moderately elevated PA pressure with BAE and moderate MR.  He underwent an outpatient and outpatient R/LHC that demonstrated normal LV pressures with mild pulmonary HTN and occlusion of SVG to diagonal with PCI of SVG to diagonal.  He was discharged on triple therapy and developed GI bleed was switched to Eliquis  and Plavix  monotherapy.  He was seen in follow-up and found to still be in a flutter but declined DCCV at that time.  He was last seen in office on 11/25/2022 by Barnie Hila, NP and reported doing well at that time.   He noted chronic stable lower extremity edema with occasional fluttering in his chest.  He was staying active with his 45 year old grandson and was compliant with medications.  He was found to be in atrial flutter with 2:1 conduction and rate of 111 bpm.  He reported noncompliance with Cardizem  and was restarted on med at that time.  Joseph Kidd presents today for annual follow-up. He has a history of atrial flutter and is currently experiencing elevated heart rates. During his last visit in July, his heart rate was 121 bpm, and he had not taken his Cardizem  (diltiazem ) at that time. He has since resumed taking diltiazem  at a dose of 120 mg daily. Despite this, his heart rate remains elevated, with recent measurements showing rates of 111 and 109 bpm. No symptoms of palpitations, dizziness, or shortness of breath during physical activity. He monitors his blood pressure at home, which typically ranges from 130 to 135 mmHg. However, during today's visit, his blood pressure was recorded at 150/100 mmHg, which he attributes to 'white coat hypertension.' He is currently taking Entresto , which contains valsartan , for blood pressure management. Approximately two weeks ago, he experienced a brief episode of vertigo after quickly moving his head while working under his Jeep. He has not followed up with a primary care doctor for this issue and has not seen a primary care physician in ten years. He plans  to re-establish care with a local doctor in [place]. He reports a history of significant leg swelling, which has improved over the last six months as he has reduced his physical workload and spent less time on his feet. He does not currently take any diuretics for this issue. He is currently taking Eliquis  and reports no issues with bleeding or other side effects. He has not experienced any new cardiac symptoms and feels well overall. Patient denies chest pain, palpitations, dyspnea, PND, orthopnea, nausea, vomiting,  dizziness, syncope, edema, weight gain, or early satiety.  Discussed the use of AI scribe software for clinical note transcription with the patient, who gave verbal consent to proceed.  History of Present Illness   Review of Systems  Please see the history of present illness.    All other systems reviewed and are otherwise negative except as noted above.  Physical Exam    Wt Readings from Last 3 Encounters:  01/11/24 266 lb (120.7 kg)  11/25/22 269 lb 9.6 oz (122.3 kg)  03/05/22 263 lb 12.8 oz (119.7 kg)   VS: Vitals:   01/11/24 1318 01/11/24 1354  BP: (!) 150/100 (!) 148/98  Pulse: (!) 109   SpO2: 94%   ,Body mass index is 37.1 kg/m. GEN: Well nourished, well developed in no acute distress Neck: No JVD; No carotid bruits Pulmonary: Clear to auscultation without rales, wheezing or rhonchi  Cardiovascular irregularly irregular. Normal S1. Normal S2.   Murmurs: There is no murmur.  ABDOMEN: Soft, non-tender, non-distended EXTREMITIES: Trace bilateral edema  EKG/LABS/ Recent Cardiac Studies   ECG personally reviewed by me today -2:1 atrial flutter with rate of 109 bpm no acute changes  Risk Assessment/Calculations:    CHA2DS2-VASc Score = 4   This indicates a 4.8% annual risk of stroke. The patient's score is based upon: CHF History: 1 HTN History: 1 Diabetes History: 0 Stroke History: 0 Vascular Disease History: 1 Age Score: 1 Gender Score: 0         Lab Results  Component Value Date   WBC 5.1 11/25/2022   HGB 13.5 11/25/2022   HCT 42.7 11/25/2022   MCV 90 11/25/2022   PLT 198 11/25/2022   Lab Results  Component Value Date   CREATININE 0.96 11/25/2022   BUN 15 11/25/2022   NA 141 11/25/2022   K 4.6 11/25/2022   CL 105 11/25/2022   CO2 25 11/25/2022   Lab Results  Component Value Date   CHOL 114 11/25/2022   HDL 30 (L) 11/25/2022   LDLCALC 70 11/25/2022   TRIG 62 11/25/2022   CHOLHDL 3.8 11/25/2022    Lab Results  Component Value Date    HGBA1C 5.5 07/03/2016   Assessment & Plan    Assessment & Plan  1.  Chronic combined CHF: -2D echo was completed showing EF of 40-45% RWMA and moderately elevated PA pressure with BAE and moderate MR - Patient is euvolemic on examination today and reports compliance with salt restriction. - We will repeat 2D echo for evaluation of LV function. - Continue Entresto  49/51 mg twice daily  2.  History of CAD: -s/p CABG x 30 June 2016, PCI with DES to SVG to RCA May 2023.  - Denies any chest pain or angina since previous follow-up. - Continue GDMT with Crestor  40 mg and as needed Nitrostat  0.4 mg  3.  Paroxysmal atrial flutter: - Patient's last 2D echo was completed 5/thousand 23 with EF of 40-45% -Chronic atrial flutter with heart rate consistently above  100 bpm, currently at 111 bpm.  -Previously non-compliance with diltiazem , now resumed. - Increase diltiazem  to 160 mg daily to improve heart rate control. - Provide 30 mg diltiazem  tablets for as-needed use if heart rate exceeds 130 bpm. - Order 2D echocardiogram to assess heart function and valve status.  4.  Essential hypertension: - HYPERTENSION CONTROL Vitals:   01/11/24 1318 01/11/24 1354  BP: (!) 150/100 (!) 148/98    The patient's blood pressure is elevated above target today.  In order to address the patient's elevated BP: Blood pressure will be monitored at home to determine if medication changes need to be made.; Follow up with general cardiology has been recommended.    Discussed potential increase in Entresto  if home blood pressure remains elevated. - Provide blood pressure log for home monitoring. - Evaluate home blood pressure readings to determine need for medication adjustment.   5.  Hyperlipidemia: - Patient's LDL cholesterol was stable at goal - Continue rosuvastatin  40 mg daily   Disposition: Follow-up with Peter Swaziland, MD or APP in 12 months    Signed, Wyn Raddle, Jackee Shove, NP 01/11/2024, 5:10  PM [facility]

## 2024-01-11 ENCOUNTER — Encounter: Payer: Self-pay | Admitting: Nurse Practitioner

## 2024-01-11 ENCOUNTER — Ambulatory Visit: Attending: Nurse Practitioner | Admitting: Nurse Practitioner

## 2024-01-11 VITALS — BP 148/98 | HR 109 | Ht 71.0 in | Wt 266.0 lb

## 2024-01-11 DIAGNOSIS — I480 Paroxysmal atrial fibrillation: Secondary | ICD-10-CM

## 2024-01-11 DIAGNOSIS — E785 Hyperlipidemia, unspecified: Secondary | ICD-10-CM

## 2024-01-11 DIAGNOSIS — I5040 Unspecified combined systolic (congestive) and diastolic (congestive) heart failure: Secondary | ICD-10-CM | POA: Diagnosis not present

## 2024-01-11 DIAGNOSIS — I25810 Atherosclerosis of coronary artery bypass graft(s) without angina pectoris: Secondary | ICD-10-CM | POA: Diagnosis not present

## 2024-01-11 DIAGNOSIS — I10 Essential (primary) hypertension: Secondary | ICD-10-CM | POA: Diagnosis not present

## 2024-01-11 LAB — LIPID PANEL

## 2024-01-11 MED ORDER — DILTIAZEM HCL ER COATED BEADS 180 MG PO CP24: 180.0000 mg | ORAL_CAPSULE | Freq: Every day | ORAL | 1 refills | Status: DC

## 2024-01-11 NOTE — Patient Instructions (Signed)
 Medication Instructions:  INCREASE Cardizem  to 180mg  Take 1 tablet once a day  *If you need a refill on your cardiac medications before your next appointment, please call your pharmacy*  Lab Work: TODAY-BMET, CBC, LIPIDS & LFT If you have labs (blood work) drawn today and your tests are completely normal, you will receive your results only by: MyChart Message (if you have MyChart) OR A paper copy in the mail If you have any lab test that is abnormal or we need to change your treatment, we will call you to review the results.  Testing/Procedures: Your physician has requested that you have an echocardiogram. Echocardiography is a painless test that uses sound waves to create images of your heart. It provides your doctor with information about the size and shape of your heart and how well your heart's chambers and valves are working. This procedure takes approximately one hour. There are no restrictions for this procedure. Please do NOT wear cologne, perfume, aftershave, or lotions (deodorant is allowed). Please arrive 15 minutes prior to your appointment time.  Please note: We ask at that you not bring children with you during ultrasound (echo/ vascular) testing. Due to room size and safety concerns, children are not allowed in the ultrasound rooms during exams. Our front office staff cannot provide observation of children in our lobby area while testing is being conducted. An adult accompanying a patient to their appointment will only be allowed in the ultrasound room at the discretion of the ultrasound technician under special circumstances. We apologize for any inconvenience.  Follow-Up: At [facility], you and your health needs are our priority.  As part of our continuing mission to provide you with exceptional heart care, our providers are all part of one team.  This team includes your primary Cardiologist (physician) and Advanced Practice Providers or APPs (Physician Assistants and  Nurse Practitioners) who all work together to provide you with the care you need, when you need it.  Your next appointment:   12 month(s)  Provider:   Peter Swaziland, MD    We recommend signing up for the patient portal called MyChart.  Sign up information is provided on this After Visit Summary.  MyChart is used to connect with patients for Virtual Visits (Telemedicine).  Patients are able to view lab/test results, encounter notes, upcoming appointments, etc.  Non-urgent messages can be sent to your provider as well.   To learn more about what you can do with MyChart, go to [email or web address].   Other Instructions Check your blood pressure daily for 2 weeks, then contact the office with your readings.  Contact the office either by phone or MyChart with your readings.  Make sure to check your blood pressure 2 hours after taking your medications.   AVOID these things for 30 minutes before checking your blood pressure: No Drinking caffeine. No Drinking alcohol. No Eating. No Smoking. No Exercising.  Five minutes before checking your blood pressure: Pee. Sit in a dining chair. Avoid sitting in a soft couch or armchair. Be quiet. Do not talk.

## 2024-01-12 ENCOUNTER — Ambulatory Visit: Payer: Self-pay | Admitting: Nurse Practitioner

## 2024-01-12 LAB — CBC
Hematocrit: 42.1 % (ref 37.5–51.0)
Hemoglobin: 13.4 g/dL (ref 13.0–17.7)
MCH: 28.8 pg (ref 26.6–33.0)
MCHC: 31.8 g/dL (ref 31.5–35.7)
MCV: 91 fL (ref 79–97)
Platelets: 182 x10E3/uL (ref 150–450)
RBC: 4.65 x10E6/uL (ref 4.14–5.80)
RDW: 13.8 % (ref 11.6–15.4)
WBC: 5.1 x10E3/uL (ref 3.4–10.8)

## 2024-01-12 LAB — LIPID PANEL
Cholesterol, Total: 115 mg/dL (ref 100–199)
HDL: 28 mg/dL — AB (ref >39)
LDL CALC COMMENT:: 4.1 ratio (ref 0.0–5.0)
LDL Chol Calc (NIH): 74 mg/dL (ref 0–99)
Triglycerides: 62 mg/dL (ref 0–149)
VLDL Cholesterol Cal: 13 mg/dL (ref 5–40)

## 2024-01-12 LAB — HEPATIC FUNCTION PANEL
ALT: 15 IU/L (ref 0–44)
AST: 17 IU/L (ref 0–40)
Albumin: 4 g/dL (ref 3.8–4.8)
Alkaline Phosphatase: 96 IU/L (ref 47–123)
Bilirubin Total: 0.7 mg/dL (ref 0.0–1.2)
Bilirubin, Direct: 0.23 mg/dL (ref 0.00–0.40)
Total Protein: 7.3 g/dL (ref 6.0–8.5)

## 2024-01-12 LAB — BASIC METABOLIC PANEL WITH GFR
BUN/Creatinine Ratio: 17 (ref 10–24)
BUN: 18 mg/dL (ref 8–27)
CO2: 24 mmol/L (ref 20–29)
Calcium: 9.1 mg/dL (ref 8.6–10.2)
Chloride: 104 mmol/L (ref 96–106)
Creatinine, Ser: 1.06 mg/dL (ref 0.76–1.27)
Glucose: 92 mg/dL (ref 70–99)
Potassium: 4.5 mmol/L (ref 3.5–5.2)
Sodium: 139 mmol/L (ref 134–144)
eGFR: 74 mL/min/1.73 (ref >59)

## 2024-01-13 ENCOUNTER — Other Ambulatory Visit: Payer: Self-pay | Admitting: Cardiology

## 2024-01-26 ENCOUNTER — Telehealth: Payer: Self-pay | Admitting: Cardiology

## 2024-01-26 ENCOUNTER — Telehealth: Payer: Self-pay | Admitting: Nurse Practitioner

## 2024-01-26 MED ORDER — SACUBITRIL-VALSARTAN 49-51 MG PO TABS: 1.0000 | ORAL_TABLET | Freq: Two times a day (BID) | ORAL | 3 refills | Status: DC

## 2024-01-26 MED ORDER — EZETIMIBE 10 MG PO TABS: 10.0000 mg | ORAL_TABLET | Freq: Every day | ORAL | 3 refills | Status: AC

## 2024-01-26 NOTE — Telephone Encounter (Signed)
 Pt's medications were sent to pt's pharmacy as requested. Confirmation received.

## 2024-01-26 NOTE — Telephone Encounter (Signed)
*  STAT* If patient is at the pharmacy, call can be transferred to refill team.   1. Which medications need to be refilled? (please list name of each medication and dose if known)   ezetimibe  (ZETIA ) 10 MG tablet  sacubitril -valsartan  (ENTRESTO ) 49-51 MG   2. Would you like to learn more about the convenience, safety, & potential cost savings by using the [facility]?   3. Are you open to using the [facility] (Type [facility]. ).  4. Which pharmacy/location (including street and city if local pharmacy) is medication to be sent to?  [id number] - [place], [place] - [address] [place] [address]   5. Do they need a 30 day or 90 day supply?   90 day  Patient stated he still has some medication left.

## 2024-01-27 ENCOUNTER — Other Ambulatory Visit (HOSPITAL_COMMUNITY): Payer: Self-pay

## 2024-01-27 NOTE — Telephone Encounter (Signed)
 Pharmacy Patient Advocate Encounter   Received notification from Physician's Office that prior authorization for ENTRESTO  is required/requested.   Insurance verification completed.   The patient is insured through [employer].   Per test claim: The current 60 day co-pay is, $0.  No PA needed at this time. This test claim was processed through [facility]- copay amounts may vary at other pharmacies due to pharmacy/plan contracts, or as the patient moves through the different stages of their insurance plan.

## 2024-02-01 MED ORDER — SACUBITRIL-VALSARTAN 49-51 MG PO TABS: 1.0000 | ORAL_TABLET | Freq: Two times a day (BID) | ORAL | 3 refills | Status: AC

## 2024-02-01 NOTE — Telephone Encounter (Signed)
 Patient following up as transmission failed to deliver to pharmacy. Patient says he only needs Entresto  but he is out of medication.

## 2024-02-01 NOTE — Addendum Note (Signed)
 Addended by: BLUFORD, Yemariam Magar L on: 02/01/2024 11:26 AM   Modules accepted: Orders

## 2024-02-01 NOTE — Telephone Encounter (Signed)
 RX resent in

## 2024-02-17 ENCOUNTER — Ambulatory Visit (HOSPITAL_COMMUNITY)
Admission: RE | Admit: 2024-02-17 | Discharge: 2024-02-17 | Disposition: A | Discharge status: Home / Self Care | Source: Ambulatory Visit | Attending: Cardiology | Admitting: Cardiology

## 2024-02-17 DIAGNOSIS — I5040 Unspecified combined systolic (congestive) and diastolic (congestive) heart failure: Secondary | ICD-10-CM | POA: Diagnosis present

## 2024-02-17 DIAGNOSIS — I25810 Atherosclerosis of coronary artery bypass graft(s) without angina pectoris: Secondary | ICD-10-CM | POA: Diagnosis present

## 2024-02-17 DIAGNOSIS — I10 Essential (primary) hypertension: Secondary | ICD-10-CM | POA: Diagnosis present

## 2024-02-17 DIAGNOSIS — E785 Hyperlipidemia, unspecified: Secondary | ICD-10-CM | POA: Insufficient documentation

## 2024-02-17 DIAGNOSIS — I480 Paroxysmal atrial fibrillation: Secondary | ICD-10-CM | POA: Diagnosis present

## 2024-02-17 LAB — ECHOCARDIOGRAM COMPLETE
Area-P 1/2: 6.86 cm2
MV M vel: 4.17 m/s
MV Peak grad: 69.6 mmHg
P 1/2 time: 455 ms
S' Lateral: 3.9 cm

## 2024-03-09 ENCOUNTER — Other Ambulatory Visit: Payer: Self-pay | Admitting: Nurse Practitioner

## 2024-04-12 ENCOUNTER — Other Ambulatory Visit: Payer: Self-pay | Admitting: Cardiology

## 2024-04-12 ENCOUNTER — Telehealth: Payer: Self-pay | Admitting: Cardiology

## 2024-04-12 MED ORDER — ROSUVASTATIN CALCIUM 40 MG PO TABS: 40.0000 mg | ORAL_TABLET | Freq: Every day | ORAL | 3 refills | Status: AC

## 2024-04-12 NOTE — Telephone Encounter (Signed)
 Refill sent

## 2024-04-12 NOTE — Telephone Encounter (Signed)
°*  STAT* If patient is at the pharmacy, call can be transferred to refill team.   1. Which medications need to be refilled? (please list name of each medication and dose if known)   rosuvastatin  (CRESTOR ) 40 MG tablet   2. Would you like to learn more about the convenience, safety, & potential cost savings by using the [facility]?   3. Are you open to using the [facility] (Type [facility]. ).  4. Which pharmacy/location (including street and city if local pharmacy) is medication to be sent to?  [facility] - [place], [place] - [address]   5. Do they need a 30 day or 90 day supply?   90 day  Patient stated he has a week's worth of medication left.
